# Patient Record
Sex: Female | Born: 1960 | Race: White | Hispanic: No | Marital: Married | Smoking: Never smoker
Health system: Southern US, Community
[De-identification: ages and names within clinical notes are randomized; demographics above are authoritative.]

## PROBLEM LIST (undated history)

## (undated) DIAGNOSIS — F419 Anxiety disorder, unspecified: Secondary | ICD-10-CM

## (undated) DIAGNOSIS — R7303 Prediabetes: Secondary | ICD-10-CM

## (undated) DIAGNOSIS — C569 Malignant neoplasm of unspecified ovary: Secondary | ICD-10-CM

## (undated) DIAGNOSIS — K219 Gastro-esophageal reflux disease without esophagitis: Secondary | ICD-10-CM

## (undated) DIAGNOSIS — M1990 Unspecified osteoarthritis, unspecified site: Secondary | ICD-10-CM

## (undated) DIAGNOSIS — F32A Depression, unspecified: Secondary | ICD-10-CM

## (undated) DIAGNOSIS — I10 Essential (primary) hypertension: Secondary | ICD-10-CM

## (undated) HISTORY — DX: Essential (primary) hypertension: I10

## (undated) HISTORY — DX: Malignant neoplasm of unspecified ovary: C56.9

## (undated) HISTORY — PX: ABDOMINAL HYSTERECTOMY: SHX81

## (undated) HISTORY — DX: Gastro-esophageal reflux disease without esophagitis: K21.9

## (undated) HISTORY — DX: Unspecified osteoarthritis, unspecified site: M19.90

## (undated) HISTORY — PX: APPENDECTOMY: SHX54

## (undated) HISTORY — PX: WISDOM TOOTH EXTRACTION: SHX21

---

## 1989-07-02 HISTORY — PX: TUBAL LIGATION: SHX77

## 2000-07-02 HISTORY — PX: ENDOMETRIAL ABLATION: SHX621

## 2014-08-02 DIAGNOSIS — C569 Malignant neoplasm of unspecified ovary: Secondary | ICD-10-CM

## 2014-08-02 DIAGNOSIS — Z8543 Personal history of malignant neoplasm of ovary: Secondary | ICD-10-CM | POA: Insufficient documentation

## 2014-08-02 HISTORY — DX: Malignant neoplasm of unspecified ovary: C56.9

## 2014-08-05 ENCOUNTER — Encounter: Payer: Self-pay | Admitting: Gynecologic Oncology

## 2014-08-05 ENCOUNTER — Ambulatory Visit: Payer: Self-pay | Attending: Gynecologic Oncology | Admitting: Gynecologic Oncology

## 2014-08-05 VITALS — BP 143/93 | HR 109 | Temp 98.2°F | Resp 16 | Ht 62.0 in | Wt 184.4 lb

## 2014-08-05 DIAGNOSIS — Z79899 Other long term (current) drug therapy: Secondary | ICD-10-CM | POA: Insufficient documentation

## 2014-08-05 DIAGNOSIS — R1900 Intra-abdominal and pelvic swelling, mass and lump, unspecified site: Secondary | ICD-10-CM

## 2014-08-05 DIAGNOSIS — F419 Anxiety disorder, unspecified: Secondary | ICD-10-CM | POA: Insufficient documentation

## 2014-08-05 DIAGNOSIS — N838 Other noninflammatory disorders of ovary, fallopian tube and broad ligament: Secondary | ICD-10-CM

## 2014-08-05 DIAGNOSIS — R971 Elevated cancer antigen 125 [CA 125]: Secondary | ICD-10-CM

## 2014-08-05 NOTE — Patient Instructions (Signed)
Plan for surgery at [facility] with Dr. Colonel Bald on Feb 10, Wednesday.  Your pre-operative appointment at [facility] will be Feb 5, tomorrow, at 1:00pm.

## 2014-08-05 NOTE — Progress Notes (Signed)
Consult Note: Gyn-Onc  Consult was requested by Dr. Marin Roberts for the evaluation of Alyssa [name] 54 y.o. female with an ovarian mass and elevated CA 125  CC:  Chief Complaint  Patient presents with  . pelvic mass    Assessment/Plan:  Alyssa [name]  is a 54 y.o.  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 Her CA-125 is elevated to 68.  I performed a history, physical examination, and personally reviewed the patient's imaging films including the CT of the abdomen and pelvis from 07/28/14. I have a low suspicion for ovarian cancer given the features of the mass, its large size, the absence of ascites, and the absence of metastatic disease on imaging. 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 I discussed with the patient that imaging and CA-125 are nonspecific for ovarian cancer and the only definitive means to rule out malignancy is complete resection of the mass and pathology. Additionally she should be prepared for a surgical staging procedure same operation if malignancy is identified.  We have scheduled the patient for an exploratory laparotomy total abdominal hysterectomy and BSO, frozen section, and surgical staging if indicated. The patient expressed desire to undergo hysterectomy regardless of the findings of malignancy or not in the ovary. I counseled the patient regarding risks of surgery including  bleeding, infection, damage to internal organs (such as bladder,ureters, bowels), blood clot, reoperation and rehospitalization.  In order to facilitate a more timely surgery at the patient's request we have pursued options for surgery in [place] at [facility]. There is availability within a week with my partner Dr. Suzan Slick.   HPI: Alyssa [name] is a 54 year old G3 P3 who is seen in consultation at the request of Dr. Marin Roberts for a 26 cm pelvic mass and elevated  CA-125. The patient began noting vague abdominal fullness and distention since December 2015. When she was evaluated by her local ER at [facility] a CT of the abdomen and pelvis was obtained on 07/28/2014. This revealed a large (26 a 12 cm) cystic mass with enhancing septations and heterogeneous cystic spaces and peripheral nodular soft tissue component. It appeared to be arising from the left ovary. It occupied the entire mid pelvis. There was deviation of the otherwise normal appearing uterus. There was no ascites lymphadenopathy perineal carcinomatosis or omental cake identified. She was seen by Dr. Armandina Stammer this further evaluated her with a CA-125 which was mildly elevated at 68. CEA was normal. Given the potential for underlying malignancy she was sent for my evaluation and consultation.  The patient is otherwise healthy woman. She denies abdominal pain, early satiety, or weight loss. Her only prior abdominal surgery is a tubal ligation. She has also undergone endometrial ablation. The family history is not concerning for increased risk for malignancy (her mother had dual cancers of vulva and lung but was a heavy smoker). She is neck for performance status. She is premenopausal and continues to have regular menses with no intermenstrual bleeding.  She has a history of an ASCUS pap 3 years ago with negative hr HPV testing at that time.  Interval History: she continues to feel abdominal distension.  Current Meds:  Outpatient Encounter Prescriptions as of 08/05/2014  Medication Sig  . ALPRAZolam (XANAX) 0.25 MG tablet Take 0.25 mg by mouth every 8 (eight) hours as needed for anxiety.  Marland Kitchen omeprazole (PRILOSEC) 20 MG capsule  Take 20 mg by mouth daily.  . ondansetron (ZOFRAN) 4 MG tablet Take 4 mg by mouth every 8 (eight) hours as needed for nausea or vomiting.  Marland Kitchen oxyCODONE (OXY IR/ROXICODONE) 5 MG immediate release tablet Take 5 mg by mouth every 6 (six) hours as needed.    Allergy:   Allergies  Allergen Reactions  . Codeine Itching  . Doxycycline     Per pt, she gets chest pain from doxycycline  . Penicillins Nausea And Vomiting    Social Hx:   History   Social History  . Marital Status: Married    Spouse Name: N/A    Number of Children: N/A  . Years of Education: N/A   Occupational History  . Not on file.   Social History Main Topics  . Smoking status: Never Smoker   . Smokeless tobacco: Not on file  . Alcohol Use: No  . Drug Use: No  . Sexual Activity: Yes   Other Topics Concern  . Not on file   Social History Narrative  . No narrative on file    Past Surgical Hx:  Past Surgical History  Procedure Laterality Date  . Tubal ligation  1991  . Endometrial ablation  2002    Past Medical Hx: History reviewed. No pertinent past medical history.  Past Gynecological History:  G3P3 (SVD's), tubal ligation. LMP in December 2015.  No LMP recorded.  Family Hx:  Family History  Problem Relation Age of Onset  . Cancer Mother   . Stroke Father     Review of Systems:  Constitutional  Feels well,    ENT Normal appearing ears and nares bilaterally Skin/Breast  No rash, sores, jaundice, itching, dryness Cardiovascular  No chest pain, shortness of breath, or edema  Pulmonary  No cough or wheeze.  Gastro Intestinal  No nausea, vomitting, or diarrhoea. No bright red blood per rectum, no abdominal pain, change in bowel movement, or constipation.  Genito Urinary  No frequency, urgency, dysuria, see HPI Musculo Skeletal  No myalgia, arthralgia, joint swelling or pain  Neurologic  No weakness, numbness, change in gait,  Psychology  No depression, anxiety, insomnia.   Vitals:  Blood pressure 143/93, pulse 109, temperature 98.2 F (36.8 C), temperature source Oral, resp. rate 16, height 5\' 2"  (1.575 m), weight 184 lb 6.4 oz (83.643 kg).  Physical Exam: WD in NAD Neck  Supple NROM, without any enlargements.  Lymph Node Survey No cervical  supraclavicular or inguinal adenopathy Cardiovascular  Pulse normal rate, regularity and rhythm. S1 and S2 normal.  Lungs  Clear to auscultation bilateraly, without wheezes/crackles/rhonchi. Good air movement.  Skin  No rash/lesions/breakdown  Psychiatry  Alert and oriented to person, place, and time  Abdomen  Normoactive bowel sounds, abdomen soft, non-tender and mildly overweight without evidence of hernia. Palpable distension from the underlying abdomino-pelvic mass (smooth, mobile). Back No CVA tenderness Genito Urinary  Vulva/vagina: Normal external female genitalia.  No lesions. No discharge or bleeding.  Bladder/urethra:  No lesions or masses, well supported bladder  Vagina: regular with no lesions  Cervix: Slit like (consistent with history of obstetric trauma) appearing, no lesions.  Uterus: Small, mobile, no parametrial involvement or nodularity.  Adnexa: large smooth cystic mass originating in pelvis (posterior to uterus) extending to abdomen.   Rectal  Good tone, no masses no cul de sac nodularity.  Extremities  No bilateral cyanosis, clubbing or edema.   Donaciano Eva, MD   08/05/2014, 3:34 PM

## 2014-08-09 ENCOUNTER — Other Ambulatory Visit: Payer: Self-pay | Admitting: Gynecologic Oncology

## 2014-08-09 DIAGNOSIS — F419 Anxiety disorder, unspecified: Secondary | ICD-10-CM

## 2014-08-09 DIAGNOSIS — N838 Other noninflammatory disorders of ovary, fallopian tube and broad ligament: Secondary | ICD-10-CM

## 2014-08-09 MED ORDER — ALPRAZOLAM 0.25 MG PO TABS
0.2500 mg | ORAL_TABLET | Freq: Three times a day (TID) | ORAL | Status: DC | PRN
Start: 2014-08-09 — End: 2014-09-06

## 2014-08-09 NOTE — Progress Notes (Signed)
Patient called requesting refill on xanax to last her until her upcoming surgery at [facility] this week. Requesting 5 tablets.  Situation discussed with Dr. Denman George.  Refill for 5 tablets written for patient and patient advised that she would not receive any refills on this medication.  Anxiety related to upcoming surgery and the outcome.  Advised to call for any questions or concerns.  She is to come by the cancer center today to pick up the prescription.

## 2014-08-19 ENCOUNTER — Telehealth: Payer: Self-pay | Admitting: Oncology

## 2014-08-19 ENCOUNTER — Other Ambulatory Visit: Payer: Self-pay | Admitting: Gynecologic Oncology

## 2014-08-19 DIAGNOSIS — D271 Benign neoplasm of left ovary: Secondary | ICD-10-CM

## 2014-08-19 NOTE — Telephone Encounter (Signed)
S/P IN REF TO NP APPT. ON 09/02/14@3 :00 CHEMO EDU 08/26/14@10 :00 REFERRING DR ROSSI DX-MUCINOUS ADENOFIBROMA OF LEFT OVARY

## 2014-08-20 ENCOUNTER — Telehealth: Payer: Self-pay | Admitting: Oncology

## 2014-08-20 NOTE — Telephone Encounter (Signed)
Pt is aware of np appt.chemo ed-08/23/14@10 :00 appt with Dr. Marko Plume 08/24/14@8 :00

## 2014-08-23 ENCOUNTER — Other Ambulatory Visit: Payer: Self-pay

## 2014-08-23 ENCOUNTER — Encounter: Payer: Self-pay | Admitting: Oncology

## 2014-08-23 ENCOUNTER — Other Ambulatory Visit: Payer: Self-pay | Admitting: Oncology

## 2014-08-23 DIAGNOSIS — C562 Malignant neoplasm of left ovary: Secondary | ICD-10-CM

## 2014-08-23 NOTE — Progress Notes (Signed)
No date for treatment as of today. °

## 2014-08-24 ENCOUNTER — Telehealth: Payer: Self-pay | Admitting: Oncology

## 2014-08-24 ENCOUNTER — Encounter: Payer: Self-pay | Admitting: Oncology

## 2014-08-24 ENCOUNTER — Other Ambulatory Visit (HOSPITAL_BASED_OUTPATIENT_CLINIC_OR_DEPARTMENT_OTHER): Payer: Self-pay

## 2014-08-24 ENCOUNTER — Ambulatory Visit: Payer: Self-pay

## 2014-08-24 ENCOUNTER — Telehealth: Payer: Self-pay | Admitting: *Deleted

## 2014-08-24 ENCOUNTER — Ambulatory Visit (HOSPITAL_BASED_OUTPATIENT_CLINIC_OR_DEPARTMENT_OTHER): Payer: Self-pay | Admitting: Oncology

## 2014-08-24 VITALS — BP 149/97 | HR 87 | Temp 99.1°F | Resp 18 | Ht 62.0 in | Wt 178.3 lb

## 2014-08-24 DIAGNOSIS — N838 Other noninflammatory disorders of ovary, fallopian tube and broad ligament: Secondary | ICD-10-CM

## 2014-08-24 DIAGNOSIS — C562 Malignant neoplasm of left ovary: Secondary | ICD-10-CM | POA: Insufficient documentation

## 2014-08-24 DIAGNOSIS — E8940 Asymptomatic postprocedural ovarian failure: Secondary | ICD-10-CM | POA: Insufficient documentation

## 2014-08-24 LAB — COMPREHENSIVE METABOLIC PANEL (CC13)
ALT: 31 U/L (ref 0–55)
AST: 33 U/L (ref 5–34)
Albumin: 3.3 g/dL — ABNORMAL LOW (ref 3.5–5.0)
Alkaline Phosphatase: 97 U/L (ref 40–150)
Anion Gap: 9 meq/L (ref 3–11)
BUN: 10.2 mg/dL (ref 7.0–26.0)
CO2: 27 meq/L (ref 22–29)
Calcium: 9.1 mg/dL (ref 8.4–10.4)
Chloride: 104 meq/L (ref 98–109)
Creatinine: 0.9 mg/dL (ref 0.6–1.1)
EGFR: 76 ml/min/1.73 m2 — ABNORMAL LOW
Glucose: 123 mg/dL (ref 70–140)
Potassium: 4 meq/L (ref 3.5–5.1)
Sodium: 140 meq/L (ref 136–145)
Total Bilirubin: 0.23 mg/dL (ref 0.20–1.20)
Total Protein: 6.7 g/dL (ref 6.4–8.3)

## 2014-08-24 LAB — CBC WITH DIFFERENTIAL/PLATELET
BASO%: 1.2 % (ref 0.0–2.0)
Basophils Absolute: 0.1 10*3/uL (ref 0.0–0.1)
EOS%: 5.7 % (ref 0.0–7.0)
Eosinophils Absolute: 0.3 10*3/uL (ref 0.0–0.5)
HCT: 37.9 % (ref 34.8–46.6)
HGB: 12.4 g/dL (ref 11.6–15.9)
LYMPH#: 0.9 10*3/uL (ref 0.9–3.3)
LYMPH%: 17.2 % (ref 14.0–49.7)
MCH: 31.1 pg (ref 25.1–34.0)
MCHC: 32.7 g/dL (ref 31.5–36.0)
MCV: 95 fL (ref 79.5–101.0)
MONO#: 0.3 10*3/uL (ref 0.1–0.9)
MONO%: 5.5 % (ref 0.0–14.0)
NEUT#: 3.6 10*3/uL (ref 1.5–6.5)
NEUT%: 70.4 % (ref 38.4–76.8)
Platelets: 375 10*3/uL (ref 145–400)
RBC: 3.99 10*6/uL (ref 3.70–5.45)
RDW: 13 % (ref 11.2–14.5)
WBC: 5.1 10*3/uL (ref 3.9–10.3)

## 2014-08-24 MED ORDER — LORAZEPAM 0.5 MG PO TABS: ORAL_TABLET | ORAL | Status: DC

## 2014-08-24 MED ORDER — DEXAMETHASONE 4 MG PO TABS
ORAL_TABLET | ORAL | Status: DC
Start: 2014-08-24 — End: 2014-09-16

## 2014-08-24 NOTE — Telephone Encounter (Signed)
, °

## 2014-08-24 NOTE — Progress Notes (Signed)
Checked in new pt that is currently having problems with her insurance Nurse, mental health).  She has not received a card or been assigned an ID number.  Her HR department is working on the problem.  I informed her that she will be self-pay and will receive a 50% discount on today's visit.  I advised once her issue with her insurance has been resolved to contact us to get her insurance added and to submit today's charges to them for reimbursement.  She verbalized understanding.

## 2014-08-24 NOTE — Patient Instructions (Signed)
We will send prescriptions to your pharmacy   1.decadron (dexamethasone, steroid) 4 mg. Take five tablets +(=20 mg) with food 12 hrs before taxol chemotherapy and five tablets with food 6 hrs before taxol   2.zofran (ondansetron) 8mg  One tablet every 8 hrs as needed for nausea. Will not make you drowsy. Fine to take one tablet AM after chemo whether or not any nausea then, to extend coverage for nausea a bit longer. Other than that dose, fine to take just as needed for nausea. You have 4 mg zofran tablets now, so can take 2 at time for 8 mg.  3.ativan (lorazepam) 0.5 mg. One tablet swallow or dissolve under tongue every 6 hrs as needed for nausea. WIll make you drowsy and a little forgetful around each dose. Fine to take one tablet at bedtime night of chemo whether or not any nausea.   Over the counter Vitamin E 500 mg at bedtime may help hot flashes, can also take 500 mg twice daily  For taxol aches:  Tylenol, aleve or ibuprofen with food, Percocet all ok to try. Can also take Claritin 10 mg beginning day after chemo to see if this helps alleviate aches. Heating pad, hot shower, ice packs can be worth trying.  You can call any time if needed [phone]

## 2014-08-24 NOTE — Progress Notes (Signed)
[place] NEW PATIENT EVALUATION   Name: Alyssa [name] Date: August 24, 2014  MRN: [id number] DOB: 1960-12-20  REFERRING PHYSICIAN: Terrence Dupont [name]/ Alyssa [name]  CC: Alyssa [name]  [facility] information including operative note, surgical pathology, multidisciplinary clinic note and labs all reviewed in detail by MD for this consultation.  REASON FOR REFERRAL:  IC3 mucinous adenocarcinoma of left ovary   HISTORY OF PRESENT ILLNESS:Alyssa [name] is a 54 y.o. female who is seen in consultation, alone for visit, at the request of Drs Alyssa [name] and Alyssa [name], for consideration of adjuvant chemotherapy for mucinous adenocarcinoma of left ovary. The ovarian mass had already ruptured at time of surgery, this done at [facility] on 08-11-14. Post operative course has been unremarkable, and recommendations given by multidisciplinary conference at [facility].  Patient attended chemotherapy teaching class prior to this visit.  Patient had been in usual good health with entirely regular menstrual periods until indigestion and difficulty eating ~ late Nov 2015, then abdominal fullness and distension by late Dec. The abdominal swelling progressed such that she was seen by PCP at [facility], then ED at [facility] on 07-28-14. CT was obtained at [facility], reportedly with 26 x 12 cm cystic mass with enhancing septations and peripheral nodular soft tissue component, apparently arising from left ovary and occupying entire mid pelvis with deviation of otherwise normal appearing uterus; no ascites, omental cake, peritoneal carcinomatosis or adenopathy per CT. She was seen by Dr Alyssa [name], with CA 125 68 and CEA normal. She was seen in consultation by Dr Alyssa [name] on 08-05-14 and, due to surgical availability, sent to Dr Alyssa [name] at [facility]. Surgery on 08-11-14 was exploratory laparotomy with TAH, BSO and lymphadenectomy, R0 at completion although the ovary had ruptured preoperatively. Intraoperatively  the appendix, bowel, nodes and upper abdomen were unremarkable. She was DC on post op day 2 on lovenox, which she continues. [facility] surgical pathology [phone] 1) from 08-11-14 found well differentiated mucinous adenocarcinoma of left ovary intestinal type with expansile pattern and associated mucinous borderline tumor also in the ovary. The primary tumor was 25 cm, no LVSI, 0/16 nodes involved appendix benign and pelvic washings positive (pT1c pN0; IC3). Case was presented at [facility] multidisciplinary conference, with consideration of FOLFOX vs carbo taxol; final recommendation was for [place] taxol.  Patient had staples removed at [facility] 08-19-14; she is to see Dr Alyssa [name] or Dr Alyssa [name] ~ 09-17-14. She attended chemotherapy education class prior to visit today.  Patient has been recovering well from surgery, tho activity is not back to baseline and she is on lifting restrictions. Abdomen feels much more comfortable than prior to surgery. She has had frequent hot flashes since surgery, none prior "every 30 min" last pm. Daughter is giving lovenox injections, uncomfortable at upper thigh and RN has reviewed administration with patient today. No bleeding, no LE swelling. No SOB or chest pain. Constipation since surgery, last bowel movement 2 days ago, small amount (see below)   REVIEW OF SYSTEMS as above, also: HA yesterday, not typical, resolved. Good visual acuity with glasses. 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 No dental discomfort tho last denatl exam/ cleaning ~ 4 years ago. No thyroid. Past asthma improved when took carpet out of home. No bladder symptoms. Appetite ok, usual weight in past had been 170-175, had been 186 preop. No arthritis. Good peripheral veins. Cyst left breast x several years, tender at times during menstrual cycle, cyst per previous evaluation. Unusually emotional at times, which she  feels is related to menopausal symptoms. Remainder of full 10 point review of systems  negative.   ALLERGIES: Codeine; Doxycycline; and Penicillins  PAST MEDICAL/ SURGICAL HISTORY:    G3P3 BTL Endometrial ablation for bleeding ~ 2002 ASCUS PAP 2013 with negative HPV then Mammogram 2 years ago at [facility] No flu vaccine  CURRENT MEDICATIONS: reviewed as listed now in EMR. Begin bid colace and add miralax daily prn. Decadron, zofran, ativan prescriptions. Suggested Vit E for hot flashes  PHARMACY: [place] on [address] in [place]:  Originally from [place], lives with husband, daughter and daughter's 59 yo in [place]. 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 Never smoker, no ETOH.  FAMILY HISTORY:  Mother with lung and vulvar cancer, was smoker Father with CVA and ETOH abuse 1 brother and 1 sister committed suicide 1 sister living and healthy 2 sons, 1 daughter ages 38- 74 healthy #7 grandchild due in May No other cancer in family          PHYSICAL EXAM:  height is _0  (1.575 m) and weight is 178 lb 4.8 oz (80.876 kg). Her oral temperature is 99.1 F (37.3 C). Her blood pressure is 149/97 and her pulse is 87. Her respiration is 18.  Alert, pleasant, cooperative lady looks stated age, good historian, appears comfortable. Easily mobile in exam room  HEENT:normal hair pattern. PERRL, not icteric. Oral mucosa moist and clear, no obvious dental concerns. Deaf left ear. Neck supple without JVD or thyroid mass.  RESPIRATORY: lungs clear to auscultation and percussion. Respirations not labored RA  CARDIAC/ VASCULAR: Heart RRR no murmur or gallop. Peripheral pulses symmetrical and intact. UE veins appear very adequate for chemotherapy  ABDOMEN: Midline incision closed, no erythema, no tenderness. Bowel sounds present, somewhat diminished. Not obviously distended. No appreciable HSM or mass, no tenderness to palpation. LYMPH NODES: No cervical, supraclavicular, axillary or inguinal  adenopathy BREASTS: Smoothly rounded, mobile 2 cm area left breast at 8:00 otherwise no dominant mass, no skin or nipple findings. NEUROLOGIC: Other than left deafness, CN, motor, sensory, cerebellar nonfocal. PSYCH appropriate mood and affect SKIN: Without rash, ecchymosis, petechiae MUSCULOSKELETAL: Back nontender. Good and symmetrical muscle mass. LE without edema, cords, tenderness.   LABORATORY DATA:  Results for orders placed or performed in visit on 08/24/14 (from the past 48 hour(s))  CBC with Differential     Status: None   Collection Time: 08/24/14  8:27 AM  Result Value Ref Range   WBC 5.1 3.9 - 10.3 10e3/uL   NEUT# 3.6 1.5 - 6.5 10e3/uL   HGB 12.4 11.6 - 15.9 g/dL   HCT 37.9 34.8 - 46.6 %   Platelets 375 145 - 400 10e3/uL   MCV 95.0 79.5 - 101.0 fL   MCH 31.1 25.1 - 34.0 pg   MCHC 32.7 31.5 - 36.0 g/dL   RBC 3.99 3.70 - 5.45 10e6/uL   RDW 13.0 11.2 - 14.5 %   lymph# 0.9 0.9 - 3.3 10e3/uL   MONO# 0.3 0.1 - 0.9 10e3/uL   Eosinophils Absolute 0.3 0.0 - 0.5 10e3/uL   Basophils Absolute 0.1 0.0 - 0.1 10e3/uL   NEUT% 70.4 38.4 - 76.8 %   LYMPH% 17.2 14.0 - 49.7 %   MONO% 5.5 0.0 - 14.0 %   EOS% 5.7 0.0 - 7.0 %   BASO% 1.2 0.0 - 2.0 %  Comprehensive metabolic panel (Cmet) - [facility]  Status: Abnormal   Collection Time: 08/24/14  8:27 AM  Result Value Ref Range   Sodium 140 136 - 145 mEq/L   Potassium 4.0 3.5 - 5.1 mEq/L   Chloride 104 98 - 109 mEq/L   CO2 27 22 - 29 mEq/L   Glucose 123 70 - 140 mg/dl   BUN 10.2 7.0 - 26.0 mg/dL   Creatinine 0.9 0.6 - 1.1 mg/dL   Total Bilirubin 0.23 0.20 - 1.20 mg/dL   Alkaline Phosphatase 97 40 - 150 U/L   AST 33 5 - 34 U/L   ALT 31 0 - 55 U/L   Total Protein 6.7 6.4 - 8.3 g/dL   Albumin 3.3 (L) 3.5 - 5.0 g/dL   Calcium 9.1 8.4 - 10.4 mg/dL   Anion Gap 9 3 - 11 mEq/L   EGFR 76 (L) >90 ml/min/1.73 m2    Comment: eGFR is calculated using the CKD-EPI Creatinine Equation (2009)      PATHOLOGY: Surgical pathology  exam2/04/2015  [facility]  Specimen  Tissue   Result Narrative  Patient:     MARTITA, BRUMM MRN:         [id number] DOB (Age):   February 12, 1961 (Age: 35) Collected:   08/11/2014 Received:    08/11/2014 Completed:   08/20/2014  Surgical Pathology Report   * Amended * Addenda Present  Accession #:   [id number]  Amendments: Amended:  08/20/2014 by Barrie Lyme Reason: Additional Information Comment: Additional pathologic findings Previous Signout Date:  08/17/2014  Diagnosis: A:  Ovary and tube, left, salpingo-oophorectomy  Histologic type:     Mucinous adenocarcinoma, with expansile growth pattern (see comment)       Histologic grade:     Well-differentiated  Tumor site: (ovary/primary peritoneal)     Ovary  Tumor size: (greatest dimension)          25 cm   Ovarian surface involvement:               Negative  Status of capsule: (intact/ruptured)          ruptured pre-operatively per OP note  Extent of involvement of other tissues/organs (select all that apply):          None  Lymphovascular space invasion:          Not identified  Regional lymph nodes (see other specimens):  (See specimen C, D, and I)           Total number involved:      0           Total number examined:     16       Additional pathologic findings:      Left fallopian tube  small paratubal cyst: no neoplasm identified Pelvic washing ([id number])  malignant cells present   AJCC Pathologic Stage: pT1c3 pN0 pMx  FIGO (2014classification) Stage Grouping:  IC3 (pelvic washings positive for malignant cells) Note:  This pathologic stage assessment is based on information available at the time of this report, and is subject to change pending clinical review and additional information.   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 -        No significant atypia, increased mitotic activity or tumor necrosis  identified  Ovary, right, oophorectomy   -      Benign cystic follicles and few epithelial inclusions, no evidence of metastatic disease  Fallopian tube, right, salpingectomy    -     Paraubal cyst, no neoplasm identified   C:  Lymph nodes, right pelvic, regional resection  -  No evidence of metastatic disease in three lymph nodes (0/3)  D:  Lymph nodes, left pelvic, regional resection  -  No evidence of metastatic disease in eight lymph nodes (0/8)  E:  Soft tissue, pericolic, right, biopsy -  No neoplasm identified   F:  Soft tissue, pericolic, left, biopsy -  No neoplasm identified    G:  Soft tissue, right pelvic, biopsy -  Endosalpingiosis, no neoplasm identified  H:  Soft tissue, left pelvic, biopsy  -  No neoplasm identified   I:  Lymph nodes, periaortic, regional resection  -  No evidence of metastatic disease in five lymph nodes (0/5)  J:  Bladder, biopsy -  Endosalpingiosis, no neoplasm identified   K:  Rectum, biopsy -  Fibrous tissue with hemorrhage and slight mesothelial hyperplasia consistent with fibrous adhesion -  No neoplasm identified  L:  Omentum, omentectomy  -  Focal slight mesothelial hyperplasia, no neoplasm identified  M:  Appendix, appendectomy  -  Benign appendix, no neoplasm identified   Comment: Immunohistochemical stains for cytokeratin 7are diffusely and strongly positive.  The cytokeratin 20 and CDX2 show patchy positivity.   This unilateral,  large (25 cm) tumor with no surface involvement, and no destructive stromal invasion is most consistent with a primary mucinous adenocarcinoma of the ovary,  intestinal type.   In areas, the tumor shows features of a mucinous borderline tumor.  On microscopic examination the tumor shows cells with prominent cytoplasmic mucin arranged in cysts as well as in  papillary tufts.  Cribriform structures are also observed and the tumor has a complex expansile growth pattern involving multiple slides.  An infiltrating growth pattern which would be more suggestive of a metastatic lesion is not identified. A primary ovarian tumor is favored, but a metastatic tumor from the upper gastrointestinal tract or pancreas cannot be completely excluded. Clinical correlation is recommended.   PAX8 positivity is present and also supports a gynecologic origin.  The report was amended to change the stage based on the positive pelvic washings ([id number]).   Intraoperative Consult Diagnosis: Frozen section is requested by Dr. Kenton Kingfisher in [place] [address] at 10:32 a.m. Specimen delivery time is 10:40 a.m.  FSA1:  Ovary, left, representative sections -  Invasive carcinoma with prominent mucinous features -  Dr. Rayburn Go concurs  Drs. Nell Range on 08/11/2014 at 11:00 a.m. (A)    Frozen Section Pathologist: Regis Bill, MD  Clinical History: 54 year old female with a pelvic mass.  Gross Description: Received are fourteen appropriately labeled containers.  Container A is additionally labeled "left ovary and left fallopian tube." It contains an 1105 gram, 25 x 17.5 x 5 cm incised ovarian mass. The external surface is gray/tan, smooth, and glistening. The cyst wall ranges from 0.2 to 1.7 cm in thickness, and is fibrous and edematous in portions. The cut surface reveals multiple complex appearing granular nodules, ranging in size from to 2 to 5 cm in greatest dimension. The cut surfaces reveal tan/yellow and fleshy lobulated parenchyma with multifocal areas of hemorrhage and rare necrosis, mucinous and cystic changes, and multiple aggregates of papillary excrescences.   Attached to the ovarian mass is an 8  cm in length x 0.6 cm unremarkable fimbriated fallopian tube; it is serially sectioned and submitted entirely in blocks A22-A24. Fimbria are bisected  and submitted entirely in block A25.    Block summary:  FSA1     - ovary and tube, left, representative section  A2-A21     - representative sections of mass    Container B is additionally labeled "uterus, cervix, right tube and ovary".  Adnexa:                    The right adnexa is present and attached  Weight:                    340 grams  Shape:                    Distorted with a very long cervix Dimensions:    height:                    13.2 cm    anterior to posterior:          8.7 cm    breadth at fundus:          6.7 cm Serosa:                    Pink/tan, smooth and glistening  Cervix:                        ectocervix:               Tan/white and glistening with a 1.5 cm in diameter os     endocervix:               Tan and glistening    length of endocervical canal:     7.9 cm  Endometrium:    length of endometrial cavity:          Approximately 2.0 cm     width of endometrial cavity at fundus:     Approximately 1.7 cm     thickness of endometrial lining:          0.3 cm          other findings:                    The endometrial cavity is distorted by diffuse adenomyosis posteriorly and an intramural nodule anteriorly. Myometrium:    thickness of wall:     Up to 6.0 cm (anterior)         other findings:          There is diffuse trabeculation of the myometrium posteriorly and a single 5.5 cm in greatest dimension tan, somewhat whorled, somewhat fleshy well-circumscribed nodule. The nodule has areas of hemorrhage within the center. Adnexa:    Right ovary:       measurement:     3.9 x 2.5 x 1.6 cm        external surface:     Gray, smooth and glistening       cut surface:          Multiple cortical cysts, some of which are filled with clear watery fluid, some are lined by tan/red tissue.  There are no solid components seen.      Right fallopian tube:       measurement:     Fimbriate, 5.5 cm in length x 0.5 cm in diameter  other findings:          There are  two clips embedded within the tube, grossly consistent with tubal ligation. The clips are approximately 1.2 cm from the fimbriated end.  The lumen on either side of the clips is patent. Other comments:          None Tissue submitted for special investigations:     No     Container M is additionally labeled "appendix" and holds an intact 5 cm long x 0.5 cm in diameter appendix. The proximal end is opened. The serosa is pink, smooth and glistening with a 0.8 cm linear area of adhesions, 0.2 cm from the proximal margin.  The lumen is filled with brown semisolid fecal material.  The mucosa is tan and glistening and the wall is 0.2 cm thick.  :  Addendum A PAX8 stain was also performed on the mucinous tumor.  There is strong patchy nuclear staining with PAX8.  This staining pattern is also supportive of a primary ovarian origin.      Cytology - Washing2/04/2015  [facility]  Specimen  Washing - Pelvic   Result Narrative  Patient:     ALANYS, GODINO MRN:         [id number] DOB (Age):   Jun 03, 1961 (Age: 41) Collected:   08/11/2014 Received:    08/12/2014 Completed:   08/17/2014   [facility]#[id number]      Non-GYN Cytopathology Report    Accession #:   [id number]  DIAGNOSIS: A. Pelvic washing - Malignant cells present  Specimen Adequacy Satisfactory for evaluation         RADIOGRAPHY: No results found. Will request report of CT done [facility] ~ 07-28-14    DISCUSSION: All of history and course to date reviewed with patient now. We have discussed mucinous adenocarcinomas and consensus that this tumor was primary in ovary based on associated mucinous borderline ovarian tumor, normal appendix and other intraoperative and pathologic findings, with recommendation for adjuvant chemotherapy primarily due to preoperative rupture. She understands that mucinous adenocarcinomas are a rare type of ovarian malignancy and that this chemotherapy is recommended due to its  usefulness in other more typical ovarian malignancies. I have offered her treatment at a different facility if more convenient, however she prefers treatment in [place] in order to coordinate most easily with [facility] gyn oncology. We have discussed outpatient administration of carboplatin and taxol, reviewed possible side effects including taxol aches and taxol neuropathy, discussed premedication steroids and antiemetics. She would like to begin in near future, date to be coordinated with infusion tho she would prefer Tuesdays and understands usual outpatient follow up during chemo.  Verbal consent obtained for chemotherapy.   As flu season has been extremely light and should be nearly over, will not give flu vaccine now. She understands that Tamiflu can be useful in unlikely event that she develops influenza.   IMPRESSION / PLAN:   1.IC3 mucinous adenocarcinoma of left ovary: preoperative rupture, surgery by gyn oncology at [facility] 08-11-14 (R0), recovering well post operatively and to begin adjuvant chemotherapy in next 1-2 weeks.  2.surgically induced menopause: suggested Vitamin E and increasing po fluids now, may need to do other interventions depending on severity of symptoms ongoing. 3.overdue mammograms, finding consistent with known cyst left breast. Will address as soon as possible 4.no colonoscopy 5.BTL and endometrial ablation 6.medication intolerances as notes 7.no flu vaccine 8.no advance directives, information given.   Patient had questions answered to her satisfaction and is in agreement  with plan above. She can contact this office for questions or concerns at any time prior to next scheduled visit. Chemo orders entered; possible neupogen/ granix . Financial staff notified for preauth. Some question re insurance, which apparently does not show from her employer (?).  Time spent 55 min, including >50% discussion and coordination of care.    Fredda Clarida P, MD 08/24/2014 12:15  PM

## 2014-08-24 NOTE — Telephone Encounter (Signed)
Per staff phone call and POF I have schedueld appts. Scheduler advised of appts.  JMW  

## 2014-08-26 ENCOUNTER — Other Ambulatory Visit: Payer: Self-pay

## 2014-08-27 ENCOUNTER — Other Ambulatory Visit: Payer: Self-pay | Admitting: Oncology

## 2014-08-31 ENCOUNTER — Telehealth: Payer: Self-pay | Admitting: Oncology

## 2014-08-31 ENCOUNTER — Other Ambulatory Visit: Payer: Self-pay | Admitting: Oncology

## 2014-08-31 ENCOUNTER — Ambulatory Visit (HOSPITAL_BASED_OUTPATIENT_CLINIC_OR_DEPARTMENT_OTHER): Payer: Self-pay

## 2014-08-31 DIAGNOSIS — C562 Malignant neoplasm of left ovary: Secondary | ICD-10-CM

## 2014-08-31 DIAGNOSIS — Z5111 Encounter for antineoplastic chemotherapy: Secondary | ICD-10-CM

## 2014-08-31 MED ORDER — PACLITAXEL CHEMO INJECTION 300 MG/50ML
175.0000 mg/m2 | Freq: Once | INTRAVENOUS | Status: AC
  Administered 2014-08-31: 330 mg via INTRAVENOUS
  Filled 2014-08-31: qty 55

## 2014-08-31 MED ORDER — ONDANSETRON 16 MG/50ML IVPB (CHCC)
INTRAVENOUS | Status: AC
  Filled 2014-08-31: qty 16

## 2014-08-31 MED ORDER — SODIUM CHLORIDE 0.9 % IV SOLN
Freq: Once | INTRAVENOUS | Status: AC
  Administered 2014-08-31: 08:00:00 via INTRAVENOUS

## 2014-08-31 MED ORDER — ONDANSETRON 16 MG/50ML IVPB (CHCC)
16.0000 mg | Freq: Once | INTRAVENOUS | Status: AC
  Administered 2014-08-31: 16 mg via INTRAVENOUS

## 2014-08-31 MED ORDER — DIPHENHYDRAMINE HCL 50 MG/ML IJ SOLN
INTRAMUSCULAR | Status: AC
  Filled 2014-08-31: qty 1

## 2014-08-31 MED ORDER — DEXAMETHASONE SODIUM PHOSPHATE 20 MG/5ML IJ SOLN
INTRAMUSCULAR | Status: AC
  Filled 2014-08-31: qty 5

## 2014-08-31 MED ORDER — DIPHENHYDRAMINE HCL 50 MG/ML IJ SOLN
50.0000 mg | Freq: Once | INTRAMUSCULAR | Status: AC
  Administered 2014-08-31: 50 mg via INTRAVENOUS

## 2014-08-31 MED ORDER — FAMOTIDINE IN NACL 20-0.9 MG/50ML-% IV SOLN
INTRAVENOUS | Status: AC
  Filled 2014-08-31: qty 50

## 2014-08-31 MED ORDER — SODIUM CHLORIDE 0.9 % IV SOLN
716.4000 mg | Freq: Once | INTRAVENOUS | Status: AC
  Administered 2014-08-31: 720 mg via INTRAVENOUS
  Filled 2014-08-31: qty 72

## 2014-08-31 MED ORDER — FAMOTIDINE IN NACL 20-0.9 MG/50ML-% IV SOLN
20.0000 mg | Freq: Once | INTRAVENOUS | Status: AC
  Administered 2014-08-31: 20 mg via INTRAVENOUS

## 2014-08-31 MED ORDER — DEXAMETHASONE SODIUM PHOSPHATE 20 MG/5ML IJ SOLN
20.0000 mg | Freq: Once | INTRAMUSCULAR | Status: AC
  Administered 2014-08-31: 20 mg via INTRAVENOUS

## 2014-08-31 NOTE — Progress Notes (Signed)
Pt instructed to stop Lovenox after dose on 09/05/14 per Dr. Marko Plume.  Pt notified that Dr. Marko Plume would like to see her on 09/06/14 with labs.

## 2014-08-31 NOTE — Patient Instructions (Addendum)
[place] Discharge Instructions for Patients Receiving Chemotherapy  Today you received the following chemotherapy agents:  Taxol and Carboplatin  To help prevent nausea and vomiting after your treatment, we encourage you to take your nausea medication as ordered per MD.   If you develop nausea and vomiting that is not controlled by your nausea medication, call the clinic.   BELOW ARE SYMPTOMS THAT SHOULD BE REPORTED IMMEDIATELY:  *FEVER GREATER THAN 100.5 F  *CHILLS WITH OR WITHOUT FEVER  NAUSEA AND VOMITING THAT IS NOT CONTROLLED WITH YOUR NAUSEA MEDICATION  *UNUSUAL SHORTNESS OF BREATH  *UNUSUAL BRUISING OR BLEEDING  TENDERNESS IN MOUTH AND THROAT WITH OR WITHOUT PRESENCE OF ULCERS  *URINARY PROBLEMS  *BOWEL PROBLEMS  UNUSUAL RASH Items with * indicate a potential emergency and should be followed up as soon as possible.  Feel free to call the clinic you have any questions or concerns. The clinic phone number is [phone].    Last dose of Lovenox on 09/05/14 per Dr. Marko Plume.  You will see Dr. Marko Plume and have lab work on 09/06/14.

## 2014-08-31 NOTE — Telephone Encounter (Signed)
Lft msg for pt confirming labs/ov per 03/01 POF..... Mailed schedule to pt... KJ

## 2014-09-01 ENCOUNTER — Telehealth: Payer: Self-pay | Admitting: *Deleted

## 2014-09-01 NOTE — Telephone Encounter (Signed)
Alyssa [name] called to ask where to bring disability papers.  Advised her to bring them to [facility] on Monday when she returns.  Spoke with her about her tolerance of her first treatment yesterday.  She said she is doing well with no GI complaints, is voiding well, eating and drinking well.  The only complaint she has is some itching on her palms for which she is taking Benadryl.

## 2014-09-02 ENCOUNTER — Ambulatory Visit: Payer: Self-pay

## 2014-09-02 ENCOUNTER — Ambulatory Visit: Payer: Self-pay | Admitting: Oncology

## 2014-09-02 ENCOUNTER — Other Ambulatory Visit: Payer: Self-pay

## 2014-09-03 ENCOUNTER — Other Ambulatory Visit: Payer: Self-pay

## 2014-09-03 DIAGNOSIS — C562 Malignant neoplasm of left ovary: Secondary | ICD-10-CM

## 2014-09-04 ENCOUNTER — Other Ambulatory Visit: Payer: Self-pay | Admitting: Oncology

## 2014-09-04 DIAGNOSIS — C562 Malignant neoplasm of left ovary: Secondary | ICD-10-CM

## 2014-09-06 ENCOUNTER — Other Ambulatory Visit (HOSPITAL_BASED_OUTPATIENT_CLINIC_OR_DEPARTMENT_OTHER): Payer: Self-pay

## 2014-09-06 ENCOUNTER — Encounter: Payer: Self-pay | Admitting: Oncology

## 2014-09-06 ENCOUNTER — Ambulatory Visit (HOSPITAL_BASED_OUTPATIENT_CLINIC_OR_DEPARTMENT_OTHER): Payer: Self-pay | Admitting: Oncology

## 2014-09-06 ENCOUNTER — Telehealth: Payer: Self-pay | Admitting: Oncology

## 2014-09-06 VITALS — BP 138/85 | HR 110 | Temp 98.5°F | Resp 18 | Ht 62.0 in | Wt 172.7 lb

## 2014-09-06 DIAGNOSIS — E8940 Asymptomatic postprocedural ovarian failure: Secondary | ICD-10-CM

## 2014-09-06 DIAGNOSIS — C562 Malignant neoplasm of left ovary: Secondary | ICD-10-CM

## 2014-09-06 DIAGNOSIS — K219 Gastro-esophageal reflux disease without esophagitis: Secondary | ICD-10-CM

## 2014-09-06 LAB — COMPREHENSIVE METABOLIC PANEL (CC13)
ALT: 62 U/L — ABNORMAL HIGH (ref 0–55)
ANION GAP: 11 meq/L (ref 3–11)
AST: 39 U/L — ABNORMAL HIGH (ref 5–34)
Albumin: 4.1 g/dL (ref 3.5–5.0)
Alkaline Phosphatase: 81 U/L (ref 40–150)
BUN: 16.5 mg/dL (ref 7.0–26.0)
CO2: 26 meq/L (ref 22–29)
CREATININE: 1 mg/dL (ref 0.6–1.1)
Calcium: 9.5 mg/dL (ref 8.4–10.4)
Chloride: 101 mEq/L (ref 98–109)
EGFR: 66 mL/min/{1.73_m2} — AB (ref >90)
Glucose: 119 mg/dl (ref 70–140)
Potassium: 3.9 mEq/L (ref 3.5–5.1)
Sodium: 139 mEq/L (ref 136–145)
Total Bilirubin: 0.47 mg/dL (ref 0.20–1.20)
Total Protein: 7.6 g/dL (ref 6.4–8.3)

## 2014-09-06 LAB — CBC WITH DIFFERENTIAL/PLATELET
BASO%: 1.4 % (ref 0.0–2.0)
Basophils Absolute: 0 10*3/uL (ref 0.0–0.1)
EOS%: 8 % — ABNORMAL HIGH (ref 0.0–7.0)
Eosinophils Absolute: 0.2 10*3/uL (ref 0.0–0.5)
HCT: 44 % (ref 34.8–46.6)
HGB: 14.1 g/dL (ref 11.6–15.9)
LYMPH%: 27.8 % (ref 14.0–49.7)
MCH: 30 pg (ref 25.1–34.0)
MCHC: 32 g/dL (ref 31.5–36.0)
MCV: 93.8 fL (ref 79.5–101.0)
MONO#: 0 10*3/uL — ABNORMAL LOW (ref 0.1–0.9)
MONO%: 1 % (ref 0.0–14.0)
NEUT%: 61.8 % (ref 38.4–76.8)
NEUTROS ABS: 1.8 10*3/uL (ref 1.5–6.5)
Platelets: 261 10*3/uL (ref 145–400)
RBC: 4.69 10*6/uL (ref 3.70–5.45)
RDW: 13.2 % (ref 11.2–14.5)
WBC: 2.9 10*3/uL — AB (ref 3.9–10.3)
lymph#: 0.8 10*3/uL — ABNORMAL LOW (ref 0.9–3.3)

## 2014-09-06 MED ORDER — OMEPRAZOLE 20 MG PO CPDR
20.0000 mg | DELAYED_RELEASE_CAPSULE | Freq: Every day | ORAL | Status: DC
Start: 2014-09-06 — End: 2014-12-09

## 2014-09-06 NOTE — Progress Notes (Signed)
OFFICE PROGRESS NOTE   September 06, 2014   Physicians:Emma Rossi/ [place] Bae-Jump,C.Gaccione  INTERVAL HISTORY:  Patient is seen, alone for visit, in continuing attention to chemotherapy being used in adjuvant fashion for [facility] mucinous adenocarcinoma of ovary, first carboplatin taxol given 08-31-14.  Patient is pleased that she has had no nausea at all with this first chemo. She had significant taxol aches mostly in LE bilaterally beginning day 3, did not use claritin but did get some relief with occasional Percocet and NSAID. Bowels are now moving well daily. She did not sleep night prior to chemo with premed decadron, then was weepy on day 2. She had itching palms and soles, improved with change in soap to [place]. She is more fatigued in last couple of days, but not markedly so. She denies peripheral neuropathy symptoms. Peripheral IV access was adequate.  No PAC No flu vaccine No genetics evaluation, and apparentli also did not have the medical insurance that she had thought from her job. Message to financial staff  Per patient, no insurance coverage for medications, and apparently no insurance otherwise, which I believe she did not realize from situation with her work prior to this illness.  ONCOLOGIC HISTORY Patient had been in usual good health with entirely regular menstrual periods until indigestion and difficulty eating ~ late Nov 2015, then abdominal fullness and distension by late Dec. The abdominal swelling progressed such that she was seen by PCP at [facility], then ED at [facility] on 07-28-14. CT was obtained at [facility], reportedly with 26 x 12 cm cystic mass with enhancing septations and peripheral nodular soft tissue component, apparently arising from left ovary and occupying entire mid pelvis with deviation of otherwise normal appearing uterus; no ascites, omental cake, peritoneal carcinomatosis or adenopathy per CT. She was seen by Dr Armandina Stammer, with CA 125 68 and CEA  normal. She was seen in consultation by Dr Denman George on 08-05-14 and, due to surgical availability, sent to Dr Suzan Slick at [facility]. Surgery on 08-11-14 was exploratory laparotomy with TAH, BSO and lymphadenectomy, R0 at completion although the ovary had ruptured preoperatively. Intraoperatively the appendix, bowel, nodes and upper abdomen were unremarkable. She was DC on post op day 2 on lovenox, which she continues. 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 The primary tumor was 25 cm, no LVSI, 0/16 nodes involved appendix benign and pelvic washings positive (pT1c pN0; IC3). Case was presented at [facility] multidisciplinary conference, with consideration of FOLFOX vs carbo taxol; final recommendation was for [place] taxol. She had first carboplatin taxol on 08-31-14.     Review of systems as above, also: No fever or symptoms of infection. No SOB or cough. No hair loss yet, aware that this will likely happen around time of second treatment. Some burning in epigastrium but no pain, glad to continue prilosec Remainder of 10 point Review of Systems negative.  Objective:  Vital signs in last 24 hours:  BP 138/85 mmHg  Pulse 110  Temp(Src) 98.5 F (36.9 C) (Oral)  Resp 18  Ht _0  (1.575 m)  Wt 172 lb 11.2 oz (78.336 kg)  BMI 31.58 kg/m2  SpO2 100% Weight down 5 lbs Alert, oriented and appropriate. Ambulatory without difficulty. Looks comfortable, respirations not labored, very pleasant. No alopecia  HEENT:PERRL, sclerae not icteric. Oral mucosa moist without lesions, posterior pharynx clear.  Neck supple. No  JVD.  Lymphatics:no cervical,supraclavicular, axillary or inguinal adenopathy Resp: clear to auscultation bilaterally and normal percussion bilaterally Cardio: regular rate and rhythm. No gallop. GI: soft, nontender, not  distended, no mass or organomegaly. Normally active bowel sounds. Surgical incision closed,  not remarkable. Musculoskeletal/ Extremities: without pitting edema, cords, tenderness Neuro: no peripheral neuropathy. Otherwise nonfocal. PSYCH appropriate mood and affect Skin without rash, ecchymosis, petechiae   Lab Results:  Results for orders placed or performed in visit on 09/06/14  CBC with Differential  Result Value Ref Range   WBC 2.9 (L) 3.9 - 10.3 10e3/uL   NEUT# 1.8 1.5 - 6.5 10e3/uL   HGB 14.1 11.6 - 15.9 g/dL   HCT 44.0 34.8 - 46.6 %   Platelets 261 145 - 400 10e3/uL   MCV 93.8 79.5 - 101.0 fL   MCH 30.0 25.1 - 34.0 pg   MCHC 32.0 31.5 - 36.0 g/dL   RBC 4.69 3.70 - 5.45 10e6/uL   RDW 13.2 11.2 - 14.5 %   lymph# 0.8 (L) 0.9 - 3.3 10e3/uL   MONO# 0.0 (L) 0.1 - 0.9 10e3/uL   Eosinophils Absolute 0.2 0.0 - 0.5 10e3/uL   Basophils Absolute 0.0 0.0 - 0.1 10e3/uL   NEUT% 61.8 38.4 - 76.8 %   LYMPH% 27.8 14.0 - 49.7 %   MONO% 1.0 0.0 - 14.0 %   EOS% 8.0 (H) 0.0 - 7.0 %   BASO% 1.4 0.0 - 2.0 %  Comprehensive metabolic panel (Cmet) - [facility]  Result Value Ref Range   Sodium 139 136 - 145 mEq/L   Potassium 3.9 3.5 - 5.1 mEq/L   Chloride 101 98 - 109 mEq/L   CO2 26 22 - 29 mEq/L   Glucose 119 70 - 140 mg/dl   BUN 16.5 7.0 - 26.0 mg/dL   Creatinine 1.0 0.6 - 1.1 mg/dL   Total Bilirubin 0.47 0.20 - 1.20 mg/dL   Alkaline Phosphatase 81 40 - 150 U/L   AST 39 (H) 5 - 34 U/L   ALT 62 (H) 0 - 55 U/L   Total Protein 7.6 6.4 - 8.3 g/dL   Albumin 4.1 3.5 - 5.0 g/dL   Calcium 9.5 8.4 - 10.4 mg/dL   Anion Gap 11 3 - 11 mEq/L   EGFR 66 (L) >90 ml/min/1.73 m2    Discussed counts as above, not yet at nadir.  Studies/Results:  No results found.  Medications: I have reviewed the patient's current medications. Continue Prilosec either prescription or OTC (does not have insurance coverage).With next treatments will begin claritin night of chemo or next day, ahead of taxol aches. OK to use  ativan day after steroids or if unable to sleep night of steroids.  DISCUSSION: reviewed status since first chemo, overall has tolerated well. Medications as above. She will call prior to recheck counts on 09-09-14 if very fatigued or any fever/ symptoms of infection, understands that she may need gCSF added. Discussed hair loss around cycle 2, already got wig.  Assessment/Plan: 1.IC3 mucinous adenocarcinoma of left ovary: preoperative rupture, surgery by gyn oncology at [facility] 08-11-14 (R0), recovering well post operatively and adjuvant carbo taxol begun 08-31-14. Will check counts on 3-10/ possible gCSF then, and I will see her with labs 3-17. She will be due cycle 2 on 09-21-14. 2.surgically induced menopause: may need to do other interventions depending on severity of symptoms ongoing. 3.overdue mammograms, finding consistent with known cyst left breast. Will address as soon as possible. Message to staff re mammogram scholarship. 4.no  colonoscopy 5.BTL and endometrial ablation 6.medication intolerances as notes 7.no flu vaccine 8.no advance directives, information given.   Patient follows discussion well and is in agreement with plans as above. She knows that she can call if needed prior to next scheduled visit.    Stoney Karczewski P, MD   09/06/2014, 3:04 PM

## 2014-09-06 NOTE — Telephone Encounter (Signed)
appts made and avs printed for pt  Alyssa °

## 2014-09-06 NOTE — Patient Instructions (Addendum)
Continue prilosec 20 mg daily - this can be either over the counter or prescription  Call if you are very tired prior to repeating blood counts on 3-10, or if fever or symptoms of infection   [phone]

## 2014-09-07 LAB — CA 125: CA 125: 65 U/mL — ABNORMAL HIGH (ref <35)

## 2014-09-08 ENCOUNTER — Encounter: Payer: Self-pay | Admitting: Oncology

## 2014-09-08 ENCOUNTER — Other Ambulatory Visit: Payer: Self-pay | Admitting: Oncology

## 2014-09-08 NOTE — Progress Notes (Signed)
We will try and get her asst for neupogen-she has no insurance at this time.

## 2014-09-08 NOTE — Progress Notes (Signed)
Called and spoke with patient and she will see Lenise for application for asst and will leave her income verification

## 2014-09-08 NOTE — Progress Notes (Signed)
I faxed a copy of the disab form to [facility] for the patient and will have her copy ready for her.

## 2014-09-09 ENCOUNTER — Ambulatory Visit (HOSPITAL_BASED_OUTPATIENT_CLINIC_OR_DEPARTMENT_OTHER): Payer: Self-pay

## 2014-09-09 ENCOUNTER — Other Ambulatory Visit (HOSPITAL_BASED_OUTPATIENT_CLINIC_OR_DEPARTMENT_OTHER): Payer: Self-pay

## 2014-09-09 ENCOUNTER — Other Ambulatory Visit: Payer: Self-pay | Admitting: Oncology

## 2014-09-09 ENCOUNTER — Telehealth: Payer: Self-pay | Admitting: *Deleted

## 2014-09-09 ENCOUNTER — Encounter: Payer: Self-pay | Admitting: Oncology

## 2014-09-09 ENCOUNTER — Telehealth: Payer: Self-pay | Admitting: Oncology

## 2014-09-09 ENCOUNTER — Other Ambulatory Visit: Payer: Self-pay

## 2014-09-09 ENCOUNTER — Ambulatory Visit: Payer: Self-pay | Admitting: Oncology

## 2014-09-09 DIAGNOSIS — D709 Neutropenia, unspecified: Secondary | ICD-10-CM

## 2014-09-09 DIAGNOSIS — C562 Malignant neoplasm of left ovary: Secondary | ICD-10-CM

## 2014-09-09 DIAGNOSIS — Z5189 Encounter for other specified aftercare: Secondary | ICD-10-CM

## 2014-09-09 LAB — CBC WITH DIFFERENTIAL/PLATELET
BASO%: 4 % — AB (ref 0.0–2.0)
Basophils Absolute: 0.1 10*3/uL (ref 0.0–0.1)
EOS ABS: 0.1 10*3/uL (ref 0.0–0.5)
EOS%: 9.5 % — ABNORMAL HIGH (ref 0.0–7.0)
HCT: 38.2 % (ref 34.8–46.6)
HGB: 12.6 g/dL (ref 11.6–15.9)
LYMPH#: 0.9 10*3/uL (ref 0.9–3.3)
LYMPH%: 67.5 % — ABNORMAL HIGH (ref 14.0–49.7)
MCH: 30.7 pg (ref 25.1–34.0)
MCHC: 33 g/dL (ref 31.5–36.0)
MCV: 93.2 fL (ref 79.5–101.0)
MONO#: 0.1 10*3/uL (ref 0.1–0.9)
MONO%: 9.5 % (ref 0.0–14.0)
NEUT#: 0.1 10*3/uL — CL (ref 1.5–6.5)
NEUT%: 9.5 % — ABNORMAL LOW (ref 38.4–76.8)
Platelets: 205 10*3/uL (ref 145–400)
RBC: 4.1 10*6/uL (ref 3.70–5.45)
RDW: 12.4 % (ref 11.2–14.5)
WBC: 1.3 10*3/uL — ABNORMAL LOW (ref 3.9–10.3)

## 2014-09-09 MED ORDER — TBO-FILGRASTIM 300 MCG/0.5ML ~~LOC~~ SOSY
300.0000 ug | PREFILLED_SYRINGE | Freq: Once | SUBCUTANEOUS | Status: AC
  Administered 2014-09-09: 300 ug via SUBCUTANEOUS
  Filled 2014-09-09: qty 0.5

## 2014-09-09 MED ORDER — CIPROFLOXACIN HCL 250 MG PO TABS: ORAL_TABLET | ORAL | Status: DC

## 2014-09-09 MED ORDER — FILGRASTIM 300 MCG/0.5ML IJ SOSY
300.0000 ug | PREFILLED_SYRINGE | Freq: Once | INTRAMUSCULAR | Status: DC
  Filled 2014-09-09: qty 0.5

## 2014-09-09 NOTE — Telephone Encounter (Signed)
While on the phone with patient regarding neutropenia I told her that Dr. Marko Plume currently has 4 cycles of chemo ordered now but that she can discuss this with MD at office visit on 09/16/14. Patient agreeable to this.

## 2014-09-09 NOTE — Telephone Encounter (Addendum)
Per Dr. Marko Plume, patient started on Cipro 250mg  BID until [place] is greater than or equal to 1.0. Prescription sent to patient's pharmacy and patient notified of this. Patient agreeable to pickup antibiotic today and to start taking it this afternoon. Reviewed appts for tomorrow and Saturday with patient as well while on the phone. Told patient that depending on her lab results from tomorrow she possibly will need to come in Monday as well for labs. Told patient RN will call her tomorrow if labs are needed on Monday.  Reviewed neutropenic precautions while on the phone. Patient states she did get masks this morning while here for the injection and she is agreeable to wear one whenever she is out in public.

## 2014-09-09 NOTE — Telephone Encounter (Signed)
THE PAPERWORK PT. RECEIVED TODAY STATED SHE HAS HAD THE SECOND CYCLE OF FOUR CYCLES. PT. THOUGHT SHE WAS TO RECEIVE SIX CYCLES. THIS NOTE WAS SENT TO DR.LIVESAY'S NURSE, Harvie Bridge.

## 2014-09-09 NOTE — Progress Notes (Signed)
Patient signed form from [employer] for possible asst. I advised her of self pay until she gets insurance so make sure she contacts billing on [employer] and/or asst. I also gave her ph# for [employer] to check into insurance.

## 2014-09-09 NOTE — Progress Notes (Signed)
I faxed app to [employer] for poss asst with neupogen. [phone]

## 2014-09-09 NOTE — Patient Instructions (Signed)
Neutropenia Neutropenia is a condition that occurs when the level of a certain type of white blood cell (neutrophil) in your body becomes lower than normal. Neutrophils are made in the bone marrow and fight infections. These cells protect against bacteria and viruses. The fewer neutrophils you have, and the longer your body remains without them, the greater your risk of getting a severe infection becomes. CAUSES  The cause of neutropenia may be hard to determine. However, it is usually due to 3 main problems:   Decreased production of neutrophils. This may be due to:  Certain medicines such as chemotherapy.  Genetic problems.  Cancer.  Radiation treatments.  Vitamin deficiency.  Some pesticides.  Increased destruction of neutrophils. This may be due to:  Overwhelming infections.  Hemolytic anemia. This is when the body destroys its own blood cells.  Chemotherapy.  Neutrophils moving to areas of the body where they cannot fight infections. This may be due to:  Dialysis procedures.  Conditions where the spleen becomes enlarged. Neutrophils are held in the spleen and are not available to the rest of the body.  Overwhelming infections. The neutrophils are held in the area of the infection and are not available to the rest of the body. SYMPTOMS  There are no specific symptoms of neutropenia. The lack of neutrophils can result in an infection, and an infection can cause various problems. DIAGNOSIS  Diagnosis is made by a blood test. A complete blood count is performed. The normal level of neutrophils in human blood differs with age and race. Infants have lower counts than older children and adults. African Americans have lower counts than Caucasians or Asians. The average adult level is 1500 cells/mm3 of blood. Neutrophil counts are interpreted as follows:  Greater than 1000 cells/mm3 gives normal protection against infection.  500 to 1000 cells/mm3 gives an increased risk for  infection.  200 to 500 cells/mm3 is a greater risk for severe infection.  Lower than 200 cells/mm3 is a marked risk of infection. This may require hospitalization and treatment with antibiotic medicines. TREATMENT  Treatment depends on the underlying cause, severity, and presence of infections or symptoms. It also depends on your health. Your caregiver will discuss the treatment plan with you. Mild cases are often easily treated and have a good outcome. Preventative measures may also be started to limit your risk of infections. Treatment can include:  Taking antibiotics.  Stopping medicines that are known to cause neutropenia.  Correcting nutritional deficiencies by eating green vegetables to supply folic acid and taking vitamin B supplements.  Stopping exposure to pesticides if your neutropenia is related to pesticide exposure.  Taking a blood growth factor called sargramostim, pegfilgrastim, or filgrastim if you are undergoing chemotherapy for cancer. This stimulates white blood cell production.  Removal of the spleen if you have Felty's syndrome and have repeated infections. HOME CARE INSTRUCTIONS   Follow your caregiver's instructions about when you need to have blood work done.  Wash your hands often. Make sure others who come in contact with you also wash their hands.  Wash raw fruits and vegetables before eating them. They can carry bacteria and fungi.  Avoid people with colds or spreadable (contagious) diseases (chickenpox, herpes zoster, influenza).  Avoid large crowds.  Avoid construction areas. The dust can release fungus into the air.  Be cautious around children in daycare or school environments.  Take care of your respiratory system by coughing and deep breathing.  Bathe daily.  Protect your skin from cuts and   burns.  Do not work in the garden or with flowers and plants.  Care for the mouth before and after meals by brushing with a soft toothbrush. If you have  mucositis, do not use mouthwash. Mouthwash contains alcohol and can dry out the mouth even more.  Clean the area between the genitals and the anus (perineal area) after urination and bowel movements. Women need to wipe from front to back.  Use a water soluble lubricant during sexual intercourse and practice good hygiene after. Do not have intercourse if you are severely neutropenic. Check with your caregiver for guidelines.  Exercise daily as tolerated.  Avoid people who were vaccinated with a live vaccine in the past 30 days. You should not receive live vaccines (polio, typhoid).  Do not provide direct care for pets. Avoid animal droppings. Do not clean litter boxes and bird cages.  Do not share food utensils.  Do not use tampons, enemas, or rectal suppositories unless directed by your caregiver.  Use an electric razor to remove hair.  Wash your hands after handling magazines, letters, and newspapers. SEEK IMMEDIATE MEDICAL CARE IF:   You have a fever.  You have chills or start to shake.  You feel nauseous or vomit.  You develop mouth sores.  You develop aches and pains.  You have redness and swelling around open wounds.  Your skin is warm to the touch.  You have pus coming from your wounds.  You develop swollen lymph nodes.  You feel weak or fatigued.  You develop red streaks on the skin. MAKE SURE YOU:  Understand these instructions.  Will watch your condition.  Will get help right away if you are not doing well or get worse. Document Released: 12/08/2001 Document Revised: 09/10/2011 Document Reviewed: 01/05/2011 ExitCare Patient Information 2015 [employer]. This information is not intended to replace advice given to you by your health care provider. Make sure you discuss any questions you have with your health care provider.  

## 2014-09-09 NOTE — Telephone Encounter (Signed)
appts made and pt called with appts for 3/11

## 2014-09-10 ENCOUNTER — Other Ambulatory Visit: Payer: Self-pay | Admitting: Oncology

## 2014-09-10 ENCOUNTER — Ambulatory Visit (HOSPITAL_BASED_OUTPATIENT_CLINIC_OR_DEPARTMENT_OTHER): Payer: Self-pay

## 2014-09-10 ENCOUNTER — Other Ambulatory Visit (HOSPITAL_BASED_OUTPATIENT_CLINIC_OR_DEPARTMENT_OTHER): Payer: Self-pay

## 2014-09-10 ENCOUNTER — Encounter: Payer: Self-pay | Admitting: Oncology

## 2014-09-10 ENCOUNTER — Other Ambulatory Visit: Payer: Self-pay

## 2014-09-10 VITALS — BP 139/96 | HR 97 | Temp 98.7°F

## 2014-09-10 DIAGNOSIS — D709 Neutropenia, unspecified: Secondary | ICD-10-CM

## 2014-09-10 DIAGNOSIS — C562 Malignant neoplasm of left ovary: Secondary | ICD-10-CM

## 2014-09-10 LAB — CBC WITH DIFFERENTIAL/PLATELET
BASO%: 1.9 % (ref 0.0–2.0)
BASOS ABS: 0 10*3/uL (ref 0.0–0.1)
EOS ABS: 0.1 10*3/uL (ref 0.0–0.5)
EOS%: 8.6 % — ABNORMAL HIGH (ref 0.0–7.0)
HEMATOCRIT: 37.2 % (ref 34.8–46.6)
HEMOGLOBIN: 12 g/dL (ref 11.6–15.9)
LYMPH%: 56.4 % — ABNORMAL HIGH (ref 14.0–49.7)
MCH: 29.9 pg (ref 25.1–34.0)
MCHC: 32.3 g/dL (ref 31.5–36.0)
MCV: 92.6 fL (ref 79.5–101.0)
MONO#: 0.3 10*3/uL (ref 0.1–0.9)
MONO%: 17.7 % — ABNORMAL HIGH (ref 0.0–14.0)
NEUT%: 15.4 % — ABNORMAL LOW (ref 38.4–76.8)
NEUTROS ABS: 0.3 10*3/uL — AB (ref 1.5–6.5)
Platelets: 189 10*3/uL (ref 145–400)
RBC: 4.02 10*6/uL (ref 3.70–5.45)
RDW: 12.8 % (ref 11.2–14.5)
WBC: 1.7 10*3/uL — AB (ref 3.9–10.3)
lymph#: 1 10*3/uL (ref 0.9–3.3)

## 2014-09-10 MED ORDER — FILGRASTIM 300 MCG/0.5ML IJ SOSY: 300.0000 ug | PREFILLED_SYRINGE | Freq: Once | INTRAMUSCULAR | Status: DC

## 2014-09-10 MED ORDER — TBO-FILGRASTIM 300 MCG/0.5ML ~~LOC~~ SOSY
300.0000 ug | PREFILLED_SYRINGE | Freq: Once | SUBCUTANEOUS | Status: AC
  Administered 2014-09-10: 300 ug via SUBCUTANEOUS
  Filled 2014-09-10: qty 0.5

## 2014-09-10 NOTE — Progress Notes (Signed)
Per [employer] patient was approved for asst with Neupogen  09/09/14-09/09/15

## 2014-09-11 ENCOUNTER — Ambulatory Visit (HOSPITAL_BASED_OUTPATIENT_CLINIC_OR_DEPARTMENT_OTHER): Payer: Self-pay

## 2014-09-11 DIAGNOSIS — C562 Malignant neoplasm of left ovary: Secondary | ICD-10-CM

## 2014-09-11 DIAGNOSIS — Z5189 Encounter for other specified aftercare: Secondary | ICD-10-CM

## 2014-09-11 MED ORDER — FILGRASTIM 300 MCG/0.5ML IJ SOSY: 300.0000 ug | PREFILLED_SYRINGE | Freq: Once | INTRAMUSCULAR | Status: DC

## 2014-09-11 MED ORDER — TBO-FILGRASTIM 300 MCG/0.5ML ~~LOC~~ SOSY
300.0000 ug | PREFILLED_SYRINGE | Freq: Once | SUBCUTANEOUS | Status: AC
  Administered 2014-09-11: 300 ug via SUBCUTANEOUS

## 2014-09-11 NOTE — Progress Notes (Signed)
Order if for neupogen, pharmacy not present to change order to granix. Pt received granix last 2 days, order placed for granix

## 2014-09-13 ENCOUNTER — Ambulatory Visit: Payer: Self-pay

## 2014-09-13 ENCOUNTER — Other Ambulatory Visit (HOSPITAL_BASED_OUTPATIENT_CLINIC_OR_DEPARTMENT_OTHER): Payer: Self-pay

## 2014-09-13 DIAGNOSIS — C562 Malignant neoplasm of left ovary: Secondary | ICD-10-CM

## 2014-09-13 LAB — CBC WITH DIFFERENTIAL/PLATELET
BASO%: 2.3 % — AB (ref 0.0–2.0)
Basophils Absolute: 0.2 10*3/uL — ABNORMAL HIGH (ref 0.0–0.1)
EOS%: 2.7 % (ref 0.0–7.0)
Eosinophils Absolute: 0.3 10*3/uL (ref 0.0–0.5)
HCT: 37.5 % (ref 34.8–46.6)
HEMOGLOBIN: 12.3 g/dL (ref 11.6–15.9)
LYMPH#: 1.7 10*3/uL (ref 0.9–3.3)
LYMPH%: 16.6 % (ref 14.0–49.7)
MCH: 30.8 pg (ref 25.1–34.0)
MCHC: 32.8 g/dL (ref 31.5–36.0)
MCV: 94 fL (ref 79.5–101.0)
MONO#: 0.8 10*3/uL (ref 0.1–0.9)
MONO%: 7.8 % (ref 0.0–14.0)
NEUT#: 7.1 10*3/uL — ABNORMAL HIGH (ref 1.5–6.5)
NEUT%: 70.6 % (ref 38.4–76.8)
Platelets: 125 10*3/uL — ABNORMAL LOW (ref 145–400)
RBC: 3.99 10*6/uL (ref 3.70–5.45)
RDW: 13.3 % (ref 11.2–14.5)
WBC: 10 10*3/uL (ref 3.9–10.3)

## 2014-09-13 NOTE — Progress Notes (Signed)
Legna here for lab and possible injection.  Order to continue Granix and Cipro if ANC less than 1.1.  ANC today 7.1  Will stop granix and cipro.  Return as scheduled

## 2014-09-14 ENCOUNTER — Telehealth: Payer: Self-pay | Admitting: *Deleted

## 2014-09-14 NOTE — Telephone Encounter (Signed)
Called patient to clarify information as noted below by Roz, RN. She states that [employer] called her directly because the papers stated she could return to work from 08-31-14 to 03-03-15 for 6-8 hours, 1-2 times a week. Message sent to [employer] in managed care as papers were given to her.

## 2014-09-14 NOTE — Telephone Encounter (Signed)
Call received at 1305 for help with [employer] paper work to return to work.  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  What can be done to straighten this out? Patient ended call at 1307 while this nurse was investigating chart for letter.  "A call is coming through from you all so I will answer it.  Thanks for your help."

## 2014-09-15 ENCOUNTER — Other Ambulatory Visit: Payer: Self-pay | Admitting: Oncology

## 2014-09-16 ENCOUNTER — Encounter: Payer: Self-pay | Admitting: Oncology

## 2014-09-16 ENCOUNTER — Other Ambulatory Visit (HOSPITAL_BASED_OUTPATIENT_CLINIC_OR_DEPARTMENT_OTHER): Payer: Self-pay

## 2014-09-16 ENCOUNTER — Ambulatory Visit (HOSPITAL_BASED_OUTPATIENT_CLINIC_OR_DEPARTMENT_OTHER): Payer: Self-pay | Admitting: Oncology

## 2014-09-16 ENCOUNTER — Telehealth: Payer: Self-pay | Admitting: Oncology

## 2014-09-16 VITALS — BP 145/83 | HR 84 | Temp 99.0°F | Resp 18 | Ht 62.0 in | Wt 180.3 lb

## 2014-09-16 DIAGNOSIS — E8940 Asymptomatic postprocedural ovarian failure: Secondary | ICD-10-CM

## 2014-09-16 DIAGNOSIS — C562 Malignant neoplasm of left ovary: Secondary | ICD-10-CM

## 2014-09-16 DIAGNOSIS — T451X5A Adverse effect of antineoplastic and immunosuppressive drugs, initial encounter: Secondary | ICD-10-CM

## 2014-09-16 DIAGNOSIS — N838 Other noninflammatory disorders of ovary, fallopian tube and broad ligament: Secondary | ICD-10-CM

## 2014-09-16 DIAGNOSIS — D701 Agranulocytosis secondary to cancer chemotherapy: Secondary | ICD-10-CM

## 2014-09-16 LAB — CBC WITH DIFFERENTIAL/PLATELET
BASO%: 0.6 % (ref 0.0–2.0)
Basophils Absolute: 0 10*3/uL (ref 0.0–0.1)
EOS%: 1.3 % (ref 0.0–7.0)
Eosinophils Absolute: 0.1 10*3/uL (ref 0.0–0.5)
HCT: 38.1 % (ref 34.8–46.6)
HEMOGLOBIN: 12.8 g/dL (ref 11.6–15.9)
LYMPH%: 21.6 % (ref 14.0–49.7)
MCH: 30.8 pg (ref 25.1–34.0)
MCHC: 33.6 g/dL (ref 31.5–36.0)
MCV: 91.8 fL (ref 79.5–101.0)
MONO#: 0.7 10*3/uL (ref 0.1–0.9)
MONO%: 10 % (ref 0.0–14.0)
NEUT%: 66.5 % (ref 38.4–76.8)
NEUTROS ABS: 4.8 10*3/uL (ref 1.5–6.5)
Platelets: 126 10*3/uL — ABNORMAL LOW (ref 145–400)
RBC: 4.15 10*6/uL (ref 3.70–5.45)
RDW: 13.1 % (ref 11.2–14.5)
WBC: 7.2 10*3/uL (ref 3.9–10.3)
lymph#: 1.6 10*3/uL (ref 0.9–3.3)

## 2014-09-16 LAB — COMPREHENSIVE METABOLIC PANEL (CC13)
ALT: 31 U/L (ref 0–55)
ANION GAP: 10 meq/L (ref 3–11)
AST: 20 U/L (ref 5–34)
Albumin: 3.8 g/dL (ref 3.5–5.0)
Alkaline Phosphatase: 87 U/L (ref 40–150)
BILIRUBIN TOTAL: 0.21 mg/dL (ref 0.20–1.20)
BUN: 14.8 mg/dL (ref 7.0–26.0)
CALCIUM: 9.4 mg/dL (ref 8.4–10.4)
CHLORIDE: 105 meq/L (ref 98–109)
CO2: 26 mEq/L (ref 22–29)
Creatinine: 1 mg/dL (ref 0.6–1.1)
EGFR: 69 mL/min/{1.73_m2} — AB (ref >90)
Glucose: 94 mg/dl (ref 70–140)
POTASSIUM: 4.6 meq/L (ref 3.5–5.1)
Sodium: 141 mEq/L (ref 136–145)
TOTAL PROTEIN: 7.2 g/dL (ref 6.4–8.3)

## 2014-09-16 MED ORDER — DEXAMETHASONE 4 MG PO TABS: ORAL_TABLET | ORAL | Status: DC

## 2014-09-16 MED ORDER — LORAZEPAM 0.5 MG PO TABS: ORAL_TABLET | ORAL | Status: DC

## 2014-09-16 NOTE — Telephone Encounter (Signed)
per pof ot sch pt appt-gave pt copy of sch °

## 2014-09-16 NOTE — Progress Notes (Signed)
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 She had cycle 1 carbo taxol on 08-31-14, with ANC down to 0.1 by day 10, recovered by day 14 after 3 granix injections. She will have cycle 2 on 09-21-14 and will need granix beginning at least day 7. She is to see Dr Colonel Bald at [facility], but would be glad to have subsequent gyn onc follow up in [place] if possible.  Patient also had aches after granix, not improved with claritin but one percocet helpful; all aches have resolved now. She has had "headache" past 3 days, which seems to be painful scalp as she is now losing hair. Bowels are moving well. She had no fever or symptoms of infection when neutropenic, and has had no bleeding   Disability papers for work not completed correctly by staff and have been redone by MD today.  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  ONCOLOGIC HISTORY Patient had been in usual good health with entirely regular menstrual periods until indigestion and difficulty eating ~ late Nov 2015, then abdominal fullness and distension by late Dec. The abdominal swelling progressed such that she was seen by PCP at [facility], then ED at [facility] on 07-28-14. CT was obtained at [facility], reportedly with 26 x 12 cm cystic mass with enhancing septations and peripheral nodular soft tissue component, apparently arising from left ovary and occupying entire mid pelvis with deviation of otherwise normal appearing uterus; no ascites, omental cake, peritoneal carcinomatosis or adenopathy per CT. She was seen by Dr Armandina Stammer, with CA 125 68 and CEA normal. She was seen in consultation by Dr Denman George on 08-05-14 and, due to surgical availability, sent to Dr Suzan Slick at  [facility]. Surgery on 08-11-14 was exploratory laparotomy with TAH, BSO and lymphadenectomy, R0 at completion although the ovary had ruptured preoperatively. Intraoperatively the appendix, bowel, nodes and upper abdomen were unremarkable. She was DC on post op day 2 on lovenox, which she continues. 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 The primary tumor was 25 cm, no LVSI, 0/16 nodes involved appendix benign and pelvic washings positive (pT1c pN0; IC3). Case was presented at [facility] multidisciplinary conference, with consideration of FOLFOX vs carbo taxol; final recommendation was for [place] taxol. She had first carboplatin taxol on 08-31-14    Review of systems as above, also: No SOB or other respiratory symptoms. Hot flashes already improved. No abdominal or pelvic pain. No significant peripheral neuropathy.  Remainder of 10 point Review of Systems negative.  Objective:  Vital signs in last 24 hours:  BP 145/83 mmHg  Pulse 84  Temp(Src) 99 F (37.2 C) (Oral)  Resp 18  Ht $R'5\' 2"'Kx$  (1.575 m)  Wt 180 lb 4.8 oz (81.784 kg)  BMI 32.97 kg/m2  SpO2 99% Weight up 8 lbs Alert, oriented and appropriate. Ambulatory without difficulty.  Partial alopecia  HEENT:PERRL, sclerae not icteric. Oral mucosa moist without lesions, posterior pharynx clear.  Neck supple. No JVD.  Lymphatics:no cervical,supraclavicular adenopathy Resp: clear to auscultation bilaterally and normal percussion bilaterally Cardio: regular rate and rhythm. No gallop. GI: soft, nontender, not distended, no mass or organomegaly.  Normally active bowel sounds. Surgical incision not remarkable. Musculoskeletal/ Extremities: without pitting edema, cords, tenderness Neuro: no peripheral neuropathy. Otherwise nonfocal. PSYCH appropriate mood and affect Skin without rash,  ecchymosis, petechiae   Lab Results:  Results for orders placed or performed in visit on 09/16/14  CBC with Differential/Platelet  Result Value Ref Range   WBC 7.2 3.9 - 10.3 10e3/uL   NEUT# 4.8 1.5 - 6.5 10e3/uL   HGB 12.8 11.6 - 15.9 g/dL   HCT 38.1 34.8 - 46.6 %   Platelets 126 (L) 145 - 400 10e3/uL   MCV 91.8 79.5 - 101.0 fL   MCH 30.8 25.1 - 34.0 pg   MCHC 33.6 31.5 - 36.0 g/dL   RBC 4.15 3.70 - 5.45 10e6/uL   RDW 13.1 11.2 - 14.5 %   lymph# 1.6 0.9 - 3.3 10e3/uL   MONO# 0.7 0.1 - 0.9 10e3/uL   Eosinophils Absolute 0.1 0.0 - 0.5 10e3/uL   Basophils Absolute 0.0 0.0 - 0.1 10e3/uL   NEUT% 66.5 38.4 - 76.8 %   LYMPH% 21.6 14.0 - 49.7 %   MONO% 10.0 0.0 - 14.0 %   EOS% 1.3 0.0 - 7.0 %   BASO% 0.6 0.0 - 2.0 %  Comprehensive metabolic panel (Cmet) - [facility]  Result Value Ref Range   Sodium 141 136 - 145 mEq/L   Potassium 4.6 3.5 - 5.1 mEq/L   Chloride 105 98 - 109 mEq/L   CO2 26 22 - 29 mEq/L   Glucose 94 70 - 140 mg/dl   BUN 14.8 7.0 - 26.0 mg/dL   Creatinine 1.0 0.6 - 1.1 mg/dL   Total Bilirubin 0.21 0.20 - 1.20 mg/dL   Alkaline Phosphatase 87 40 - 150 U/L   AST 20 5 - 34 U/L   ALT 31 0 - 55 U/L   Total Protein 7.2 6.4 - 8.3 g/dL   Albumin 3.8 3.5 - 5.0 g/dL   Calcium 9.4 8.4 - 10.4 mg/dL   Anion Gap 10 3 - 11 mEq/L   EGFR 69 (L) >90 ml/min/1.73 m2     Studies/Results:  No results found.  Medications: I have reviewed the patient's current medications. Premedicaiton decadron available. OK to use percocet for significant aches.  DISCUSSION: scalp soreness, timing of taxol aches and granix administration discussed. Financial staff aware of no insurance.   Assessment/Plan: 1.IC3 mucinous adenocarcinoma of left ovary: preoperative rupture, surgery by gyn oncology at [facility] 08-11-14 (R0), and adjuvant carbo taxol begun 08-31-14. WIll treat cycle 2 on 09-21-14 as long as ANC >=1.5 and plt >=100k, with granix days 6-7-8. I will see her with labs 10-04-14. 2.surgically induced  menopause: symptoms improved 3.overdue mammograms, finding consistent with known cyst left breast. Will address as soon as possible. Message to staff re mammogram scholarship. 4.no colonoscopy 5.BTL and endometrial ablation 6.medication intolerances as noted 7.no flu vaccine 8.no advance directives, information given.   Chemo orders completed using today's chemistries, granix orders placed.  All questions answered and patient is in agreement with plans above Time spent 25 min including >50% counseling and coordination of care.    Gordy Levan, MD   09/16/2014, 4:36 PM

## 2014-09-16 NOTE — Telephone Encounter (Signed)
Appointments made and avs printred for pt  Alyssa [name]

## 2014-09-17 ENCOUNTER — Encounter: Payer: Self-pay | Admitting: Oncology

## 2014-09-17 NOTE — Progress Notes (Signed)
I left a message for patient to call me back. I need the date she went out of work.

## 2014-09-17 NOTE — Progress Notes (Signed)
I called again and left message for patient to let me know when as her last day at work. She left me a message

## 2014-09-17 NOTE — Progress Notes (Signed)
I spoke with the patient and she can't remember the exact day she last worked, but knew it was 08/2014. She wants copy of form going to [employer](leaving upfront with Ms. Wilma). I faxed to [phone]

## 2014-09-21 ENCOUNTER — Other Ambulatory Visit (HOSPITAL_BASED_OUTPATIENT_CLINIC_OR_DEPARTMENT_OTHER): Payer: Self-pay

## 2014-09-21 ENCOUNTER — Ambulatory Visit (HOSPITAL_BASED_OUTPATIENT_CLINIC_OR_DEPARTMENT_OTHER): Payer: Self-pay

## 2014-09-21 DIAGNOSIS — C562 Malignant neoplasm of left ovary: Secondary | ICD-10-CM

## 2014-09-21 DIAGNOSIS — Z5111 Encounter for antineoplastic chemotherapy: Secondary | ICD-10-CM

## 2014-09-21 LAB — CBC WITH DIFFERENTIAL/PLATELET
BASO%: 0.4 % (ref 0.0–2.0)
Basophils Absolute: 0 10*3/uL (ref 0.0–0.1)
EOS ABS: 0 10*3/uL (ref 0.0–0.5)
EOS%: 0.1 % (ref 0.0–7.0)
HCT: 40.2 % (ref 34.8–46.6)
HGB: 13.2 g/dL (ref 11.6–15.9)
LYMPH%: 8.3 % — AB (ref 14.0–49.7)
MCH: 30.2 pg (ref 25.1–34.0)
MCHC: 32.9 g/dL (ref 31.5–36.0)
MCV: 91.7 fL (ref 79.5–101.0)
MONO#: 0 10*3/uL — ABNORMAL LOW (ref 0.1–0.9)
MONO%: 0.5 % (ref 0.0–14.0)
NEUT%: 90.7 % — ABNORMAL HIGH (ref 38.4–76.8)
NEUTROS ABS: 3.7 10*3/uL (ref 1.5–6.5)
PLATELETS: 275 10*3/uL (ref 145–400)
RBC: 4.38 10*6/uL (ref 3.70–5.45)
RDW: 13.6 % (ref 11.2–14.5)
WBC: 4.1 10*3/uL (ref 3.9–10.3)
lymph#: 0.3 10*3/uL — ABNORMAL LOW (ref 0.9–3.3)

## 2014-09-21 MED ORDER — PACLITAXEL CHEMO INJECTION 300 MG/50ML
175.0000 mg/m2 | Freq: Once | INTRAVENOUS | Status: AC
  Administered 2014-09-21: 330 mg via INTRAVENOUS
  Filled 2014-09-21: qty 55

## 2014-09-21 MED ORDER — FAMOTIDINE IN NACL 20-0.9 MG/50ML-% IV SOLN
20.0000 mg | Freq: Once | INTRAVENOUS | Status: AC
  Administered 2014-09-21: 20 mg via INTRAVENOUS

## 2014-09-21 MED ORDER — ACETAMINOPHEN 325 MG PO TABS
650.0000 mg | ORAL_TABLET | Freq: Once | ORAL | Status: AC
  Administered 2014-09-21: 650 mg via ORAL

## 2014-09-21 MED ORDER — FAMOTIDINE IN NACL 20-0.9 MG/50ML-% IV SOLN
INTRAVENOUS | Status: AC
  Filled 2014-09-21: qty 50

## 2014-09-21 MED ORDER — SODIUM CHLORIDE 0.9 % IV SOLN
Freq: Once | INTRAVENOUS | Status: AC
  Administered 2014-09-21: 11:00:00 via INTRAVENOUS
  Filled 2014-09-21: qty 8

## 2014-09-21 MED ORDER — DIPHENHYDRAMINE HCL 50 MG/ML IJ SOLN
INTRAMUSCULAR | Status: AC
  Filled 2014-09-21: qty 1

## 2014-09-21 MED ORDER — ACETAMINOPHEN 325 MG PO TABS
ORAL_TABLET | ORAL | Status: AC
  Filled 2014-09-21: qty 2

## 2014-09-21 MED ORDER — SODIUM CHLORIDE 0.9 % IV SOLN
Freq: Once | INTRAVENOUS | Status: AC
  Administered 2014-09-21: 11:00:00 via INTRAVENOUS

## 2014-09-21 MED ORDER — DIPHENHYDRAMINE HCL 50 MG/ML IJ SOLN
50.0000 mg | Freq: Once | INTRAMUSCULAR | Status: AC
  Administered 2014-09-21: 50 mg via INTRAVENOUS

## 2014-09-21 MED ORDER — SODIUM CHLORIDE 0.9 % IV SOLN
654.0000 mg | Freq: Once | INTRAVENOUS | Status: AC
  Administered 2014-09-21: 650 mg via INTRAVENOUS
  Filled 2014-09-21: qty 65

## 2014-09-21 NOTE — Progress Notes (Signed)
Patient took her decadron 12hrs. And 6hrs at home prior to treatment. 1335 Pt. C/o headache.  She thinks it b/c she has not had any caffeine, 1337. Tylenol given. 1444 Patient states her headache is much better.

## 2014-09-21 NOTE — Patient Instructions (Signed)
[place] Discharge Instructions for Patients Receiving Chemotherapy  Today you received the following chemotherapy agents:  Taxol and Carboplatin  To help prevent nausea and vomiting after your treatment, we encourage you to take your nausea medication as ordered per MD.   If you develop nausea and vomiting that is not controlled by your nausea medication, call the clinic.   BELOW ARE SYMPTOMS THAT SHOULD BE REPORTED IMMEDIATELY:  *FEVER GREATER THAN 100.5 F  *CHILLS WITH OR WITHOUT FEVER  NAUSEA AND VOMITING THAT IS NOT CONTROLLED WITH YOUR NAUSEA MEDICATION  *UNUSUAL SHORTNESS OF BREATH  *UNUSUAL BRUISING OR BLEEDING  TENDERNESS IN MOUTH AND THROAT WITH OR WITHOUT PRESENCE OF ULCERS  *URINARY PROBLEMS  *BOWEL PROBLEMS  UNUSUAL RASH Items with * indicate a potential emergency and should be followed up as soon as possible.  Feel free to call the clinic you have any questions or concerns. The clinic phone number is [phone].    Last dose of Lovenox on 09/05/14 per Dr. Marko Plume.  You will see Dr. Marko Plume and have lab work on 09/06/14.

## 2014-09-27 ENCOUNTER — Ambulatory Visit (HOSPITAL_BASED_OUTPATIENT_CLINIC_OR_DEPARTMENT_OTHER): Payer: Self-pay

## 2014-09-27 DIAGNOSIS — C562 Malignant neoplasm of left ovary: Secondary | ICD-10-CM

## 2014-09-27 DIAGNOSIS — Z5189 Encounter for other specified aftercare: Secondary | ICD-10-CM

## 2014-09-27 MED ORDER — FILGRASTIM 300 MCG/0.5ML IJ SOSY
300.0000 ug | PREFILLED_SYRINGE | Freq: Once | INTRAMUSCULAR | Status: AC
  Administered 2014-09-27: 300 ug via SUBCUTANEOUS
  Filled 2014-09-27: qty 0.5

## 2014-09-28 ENCOUNTER — Ambulatory Visit (HOSPITAL_BASED_OUTPATIENT_CLINIC_OR_DEPARTMENT_OTHER): Payer: Self-pay

## 2014-09-28 DIAGNOSIS — Z5189 Encounter for other specified aftercare: Secondary | ICD-10-CM

## 2014-09-28 DIAGNOSIS — C562 Malignant neoplasm of left ovary: Secondary | ICD-10-CM

## 2014-09-28 MED ORDER — FILGRASTIM 300 MCG/0.5ML IJ SOSY
300.0000 ug | PREFILLED_SYRINGE | Freq: Once | INTRAMUSCULAR | Status: AC
  Administered 2014-09-28: 300 ug via SUBCUTANEOUS
  Filled 2014-09-28: qty 0.5

## 2014-09-29 ENCOUNTER — Ambulatory Visit (HOSPITAL_BASED_OUTPATIENT_CLINIC_OR_DEPARTMENT_OTHER): Payer: Self-pay

## 2014-09-29 DIAGNOSIS — Z5189 Encounter for other specified aftercare: Secondary | ICD-10-CM

## 2014-09-29 DIAGNOSIS — C562 Malignant neoplasm of left ovary: Secondary | ICD-10-CM

## 2014-09-29 MED ORDER — FILGRASTIM 300 MCG/0.5ML IJ SOSY
300.0000 ug | PREFILLED_SYRINGE | Freq: Once | INTRAMUSCULAR | Status: AC
  Administered 2014-09-29: 300 ug via SUBCUTANEOUS
  Filled 2014-09-29: qty 0.5

## 2014-10-01 ENCOUNTER — Other Ambulatory Visit: Payer: Self-pay

## 2014-10-01 ENCOUNTER — Other Ambulatory Visit: Payer: Self-pay | Admitting: Oncology

## 2014-10-01 DIAGNOSIS — N838 Other noninflammatory disorders of ovary, fallopian tube and broad ligament: Secondary | ICD-10-CM

## 2014-10-01 DIAGNOSIS — C562 Malignant neoplasm of left ovary: Secondary | ICD-10-CM

## 2014-10-01 MED ORDER — LORAZEPAM 0.5 MG PO TABS
ORAL_TABLET | ORAL | Status: DC
Start: 2014-10-01 — End: 2014-10-29

## 2014-10-04 ENCOUNTER — Encounter: Payer: Self-pay | Admitting: Oncology

## 2014-10-04 ENCOUNTER — Other Ambulatory Visit (HOSPITAL_BASED_OUTPATIENT_CLINIC_OR_DEPARTMENT_OTHER): Payer: Self-pay

## 2014-10-04 ENCOUNTER — Ambulatory Visit (HOSPITAL_BASED_OUTPATIENT_CLINIC_OR_DEPARTMENT_OTHER): Payer: Self-pay | Admitting: Oncology

## 2014-10-04 ENCOUNTER — Telehealth: Payer: Self-pay | Admitting: Oncology

## 2014-10-04 VITALS — BP 149/90 | HR 81 | Temp 98.3°F | Resp 18 | Ht 62.0 in | Wt 181.0 lb

## 2014-10-04 DIAGNOSIS — C562 Malignant neoplasm of left ovary: Secondary | ICD-10-CM

## 2014-10-04 DIAGNOSIS — N838 Other noninflammatory disorders of ovary, fallopian tube and broad ligament: Secondary | ICD-10-CM

## 2014-10-04 DIAGNOSIS — E8940 Asymptomatic postprocedural ovarian failure: Secondary | ICD-10-CM

## 2014-10-04 DIAGNOSIS — D701 Agranulocytosis secondary to cancer chemotherapy: Secondary | ICD-10-CM

## 2014-10-04 DIAGNOSIS — T451X5A Adverse effect of antineoplastic and immunosuppressive drugs, initial encounter: Secondary | ICD-10-CM

## 2014-10-04 DIAGNOSIS — R55 Syncope and collapse: Secondary | ICD-10-CM

## 2014-10-04 LAB — CBC WITH DIFFERENTIAL/PLATELET
BASO%: 1.1 % (ref 0.0–2.0)
Basophils Absolute: 0.1 10*3/uL (ref 0.0–0.1)
EOS%: 3.6 % (ref 0.0–7.0)
Eosinophils Absolute: 0.2 10*3/uL (ref 0.0–0.5)
HEMATOCRIT: 36.2 % (ref 34.8–46.6)
HEMOGLOBIN: 12.2 g/dL (ref 11.6–15.9)
LYMPH%: 30.7 % (ref 14.0–49.7)
MCH: 31 pg (ref 25.1–34.0)
MCHC: 33.7 g/dL (ref 31.5–36.0)
MCV: 91.9 fL (ref 79.5–101.0)
MONO#: 0.4 10*3/uL (ref 0.1–0.9)
MONO%: 8.4 % (ref 0.0–14.0)
NEUT#: 2.5 10*3/uL (ref 1.5–6.5)
NEUT%: 56.2 % (ref 38.4–76.8)
PLATELETS: 167 10*3/uL (ref 145–400)
RBC: 3.94 10*6/uL (ref 3.70–5.45)
RDW: 13.7 % (ref 11.2–14.5)
WBC: 4.4 10*3/uL (ref 3.9–10.3)
lymph#: 1.4 10*3/uL (ref 0.9–3.3)

## 2014-10-04 LAB — COMPREHENSIVE METABOLIC PANEL (CC13)
ALBUMIN: 3.7 g/dL (ref 3.5–5.0)
ALK PHOS: 84 U/L (ref 40–150)
ALT: 26 U/L (ref 0–55)
AST: 21 U/L (ref 5–34)
Anion Gap: 10 mEq/L (ref 3–11)
BUN: 16.9 mg/dL (ref 7.0–26.0)
CALCIUM: 9.2 mg/dL (ref 8.4–10.4)
CO2: 25 mEq/L (ref 22–29)
CREATININE: 0.8 mg/dL (ref 0.6–1.1)
Chloride: 105 mEq/L (ref 98–109)
EGFR: 80 mL/min/{1.73_m2} — ABNORMAL LOW (ref >90)
Glucose: 102 mg/dl (ref 70–140)
Potassium: 4.2 mEq/L (ref 3.5–5.1)
SODIUM: 141 meq/L (ref 136–145)
Total Bilirubin: <0.2 mg/dL (ref 0.20–1.20)
Total Protein: 7 g/dL (ref 6.4–8.3)

## 2014-10-04 MED ORDER — DEXAMETHASONE 4 MG PO TABS: ORAL_TABLET | ORAL | Status: DC

## 2014-10-04 NOTE — Progress Notes (Signed)
OFFICE PROGRESS NOTE   October 04, 2014   Physicians:Emma Rossi/ [place] Bae-Jump,C.Gaccione  INTERVAL HISTORY:   Patient is seen, alone for visit, in continuing attention to adjuvant chemotherapy for IC3 mucinous adenocarcinoma of left ovary, having had cycle 2 carbo taxol on 09-21-14 with granix x3 beginning 09-27-14. She saw Dr Colonel Bald at [facility] on 09-17-14 and will see her or Dr Denman George after chemo completes.   Patient is feeling well now, tho she had more nausea x ~ 5 days after cycle 2 chemo, possibly related to eating fried foods night of treatment. Prn anitemetics were helpful, tho she used only 4 mg zofran, and bowels moved well during that time. Aches from taxol and granix have not been bothersome. She had minimal peripheral neuropathy fingers and feet after treatment, all resolved with exception of right index finger and right foot.     No PAC No flu vaccine No genetics evaluation   ONCOLOGIC HISTORY Patient had been in usual good health with entirely regular menstrual periods until indigestion and difficulty eating ~ late Nov 2015, then abdominal fullness and distension by late Dec. The abdominal swelling progressed such that she was seen by PCP at [facility], then ED at [facility] on 07-28-14. CT was obtained at [facility], reportedly with 26 x 12 cm cystic mass with enhancing septations and peripheral nodular soft tissue component, apparently arising from left ovary and occupying entire mid pelvis with deviation of otherwise normal appearing uterus; no ascites, omental cake, peritoneal carcinomatosis or adenopathy per CT. She was seen by Dr Armandina Stammer, with CA 125 68 and CEA normal. She was seen in consultation by Dr Denman George on 08-05-14 and, due to surgical availability, sent to Dr Suzan Slick at [facility]. Surgery on 08-11-14 was exploratory laparotomy with TAH, BSO and lymphadenectomy, R0 at completion although the ovary had ruptured preoperatively. Intraoperatively the appendix,  bowel, nodes and upper abdomen were unremarkable. She was DC on post op day 2 on lovenox, which she continues. [facility] surgical pathology [id number] 1) from 08-11-14 found well differentiated mucinous adenocarcinoma of left ovary intestinal type with expansile pattern and associated mucinous borderline tumor also in the ovary. The primary tumor was 25 cm, no LVSI, 0/16 nodes involved appendix benign and pelvic washings positive (pT1c pN0; IC3). Case was presented at [facility] multidisciplinary conference, with consideration of FOLFOX vs carbo taxol; final recommendation was for [place] taxol. She had first carboplatin taxol on 08-31-14       Review of systems as above, also: No fever or symptoms of infection. No respiratory or bladder symptoms. No bleeding. Appetite now is good. Peripheral IV access tolerated well. Frequent hot flashes Remainder of 10 point Review of Systems negative.  Objective:  Vital signs in last 24 hours:  BP 149/90 mmHg  Pulse 81  Temp(Src) 98.3 F (36.8 C) (Oral)  Resp 18  Ht 5' 2" (1.575 m)  Wt 181 lb (82.101 kg)  BMI 33.10 kg/m2  SpO2 99% Weight up 0.5 lb Alert, oriented and appropriate. Ambulatory without difficulty.  Alopecia  HEENT:PERRL, sclerae not icteric. Oral mucosa moist without lesions, posterior pharynx clear.  Neck supple. No JVD.  Lymphatics:no cervical,supraclavicular, axillary or inguinal adenopathy Resp: clear to auscultation bilaterally and normal percussion bilaterally Cardio: regular rate and rhythm. No gallop. GI: abdomen soft, nontender, not distended, no mass or organomegaly. Normally active bowel sounds. Surgical incision not remarkable. Musculoskeletal/ Extremities: without pitting edema, cords, tenderness Neuro: no peripheral neuropathy. Otherwise nonfocal Skin without rash, ecchymosis, petechiae   Lab  Results:  Results for orders placed or performed in visit on 10/04/14  CBC with Differential  Result Value Ref Range   WBC 4.4 3.9 - 10.3  10e3/uL   NEUT# 2.5 1.5 - 6.5 10e3/uL   HGB 12.2 11.6 - 15.9 g/dL   HCT 36.2 34.8 - 46.6 %   Platelets 167 145 - 400 10e3/uL   MCV 91.9 79.5 - 101.0 fL   MCH 31.0 25.1 - 34.0 pg   MCHC 33.7 31.5 - 36.0 g/dL   RBC 3.94 3.70 - 5.45 10e6/uL   RDW 13.7 11.2 - 14.5 %   lymph# 1.4 0.9 - 3.3 10e3/uL   MONO# 0.4 0.1 - 0.9 10e3/uL   Eosinophils Absolute 0.2 0.0 - 0.5 10e3/uL   Basophils Absolute 0.1 0.0 - 0.1 10e3/uL   NEUT% 56.2 38.4 - 76.8 %   LYMPH% 30.7 14.0 - 49.7 %   MONO% 8.4 0.0 - 14.0 %   EOS% 3.6 0.0 - 7.0 %   BASO% 1.1 0.0 - 2.0 %  Comprehensive metabolic panel (Cmet) - [facility]  Result Value Ref Range   Sodium 141 136 - 145 mEq/L   Potassium 4.2 3.5 - 5.1 mEq/L   Chloride 105 98 - 109 mEq/L   CO2 25 22 - 29 mEq/L   Glucose 102 70 - 140 mg/dl   BUN 16.9 7.0 - 26.0 mg/dL   Creatinine 0.8 0.6 - 1.1 mg/dL   Total Bilirubin <0.20 0.20 - 1.20 mg/dL   Alkaline Phosphatase 84 40 - 150 U/L   AST 21 5 - 34 U/L   ALT 26 0 - 55 U/L   Total Protein 7.0 6.4 - 8.3 g/dL   Albumin 3.7 3.5 - 5.0 g/dL   Calcium 9.2 8.4 - 10.4 mg/dL   Anion Gap 10 3 - 11 mEq/L   EGFR 80 (L) >90 ml/min/1.73 m2     Studies/Results:  No results found.  Medications: I have reviewed the patient's current medications. May want to try 8 mg zofran if nausea after next cycle chemo; consider adding EMEND if delayed nausea continues to be difficult (note no insurance).  DISCUSSION: patient is pleased that she continues to tolerate treatment this well and is in agreement with continuing as planned. Counts likely will be good for treatment with cycle 3 on 10-12-14 and she prefers checking CBC that day to coming for counts day prior. She will have granix x 3 days 4-18,19,20.  Assessment/Plan: 1.IC3 mucinous adenocarcinoma of left ovary: preoperative rupture, surgery by gyn oncology at [facility] 08-11-14 (R0), and adjuvant carbo taxol begun 08-31-14. WIll treat cycle 3 on 10-12-14 as long as ANC >=1.5 and plt >=100k, with granix  days 6-7-8. I will see her with labs on 10-18-14.  2.surgically induced menopause: frequent hot flashes despite Vitamin E. Encouraged pushing po fluids. 3.overdue mammograms, finding consistent with known cyst left breast. Will address as soon as possible. Message to staff re mammogram scholarship. 4.no colonoscopy 5.BTL and endometrial ablation 6.medication intolerances as noted 7.no flu vaccine 8.no advance directives, information given.   Chemo and granix orders entered. All questions anseered and she knows to call if needed prior to next scheduled visits. Time spent 25 min including >50% counseling and coordination of care.    Parneet Glantz P, MD   10/04/2014, 1:33 PM

## 2014-10-04 NOTE — Telephone Encounter (Signed)
Appointments made and avs printed for patient °

## 2014-10-05 DIAGNOSIS — R55 Syncope and collapse: Secondary | ICD-10-CM | POA: Insufficient documentation

## 2014-10-05 DIAGNOSIS — T451X5A Adverse effect of antineoplastic and immunosuppressive drugs, initial encounter: Secondary | ICD-10-CM | POA: Insufficient documentation

## 2014-10-05 DIAGNOSIS — D701 Agranulocytosis secondary to cancer chemotherapy: Secondary | ICD-10-CM | POA: Insufficient documentation

## 2014-10-12 ENCOUNTER — Other Ambulatory Visit (HOSPITAL_BASED_OUTPATIENT_CLINIC_OR_DEPARTMENT_OTHER): Payer: Self-pay

## 2014-10-12 ENCOUNTER — Ambulatory Visit (HOSPITAL_BASED_OUTPATIENT_CLINIC_OR_DEPARTMENT_OTHER): Payer: Self-pay

## 2014-10-12 DIAGNOSIS — C562 Malignant neoplasm of left ovary: Secondary | ICD-10-CM

## 2014-10-12 DIAGNOSIS — Z5111 Encounter for antineoplastic chemotherapy: Secondary | ICD-10-CM

## 2014-10-12 LAB — COMPREHENSIVE METABOLIC PANEL (CC13)
ALK PHOS: 85 U/L (ref 40–150)
ALT: 20 U/L (ref 0–55)
AST: 18 U/L (ref 5–34)
Albumin: 4.2 g/dL (ref 3.5–5.0)
Anion Gap: 11 mEq/L (ref 3–11)
BILIRUBIN TOTAL: 0.42 mg/dL (ref 0.20–1.20)
BUN: 15.9 mg/dL (ref 7.0–26.0)
CO2: 25 meq/L (ref 22–29)
CREATININE: 1 mg/dL (ref 0.6–1.1)
Calcium: 10.2 mg/dL (ref 8.4–10.4)
Chloride: 102 mEq/L (ref 98–109)
EGFR: 68 mL/min/{1.73_m2} — AB (ref >90)
Glucose: 208 mg/dl — ABNORMAL HIGH (ref 70–140)
Potassium: 4.4 mEq/L (ref 3.5–5.1)
Sodium: 138 mEq/L (ref 136–145)
Total Protein: 8.1 g/dL (ref 6.4–8.3)

## 2014-10-12 LAB — CBC WITH DIFFERENTIAL/PLATELET
BASO%: 0 % (ref 0.0–2.0)
Basophils Absolute: 0 10*3/uL (ref 0.0–0.1)
EOS ABS: 0 10*3/uL (ref 0.0–0.5)
EOS%: 0 % (ref 0.0–7.0)
HCT: 39.6 % (ref 34.8–46.6)
HGB: 13.5 g/dL (ref 11.6–15.9)
LYMPH#: 0.3 10*3/uL — AB (ref 0.9–3.3)
LYMPH%: 7.2 % — ABNORMAL LOW (ref 14.0–49.7)
MCH: 31.1 pg (ref 25.1–34.0)
MCHC: 34.1 g/dL (ref 31.5–36.0)
MCV: 91.2 fL (ref 79.5–101.0)
MONO#: 0 10*3/uL — ABNORMAL LOW (ref 0.1–0.9)
MONO%: 0.5 % (ref 0.0–14.0)
NEUT%: 92.3 % — ABNORMAL HIGH (ref 38.4–76.8)
NEUTROS ABS: 3.7 10*3/uL (ref 1.5–6.5)
Platelets: 261 10*3/uL (ref 145–400)
RBC: 4.34 10*6/uL (ref 3.70–5.45)
RDW: 14.2 % (ref 11.2–14.5)
WBC: 4 10*3/uL (ref 3.9–10.3)

## 2014-10-12 MED ORDER — DIPHENHYDRAMINE HCL 50 MG/ML IJ SOLN
INTRAMUSCULAR | Status: AC
  Filled 2014-10-12: qty 1

## 2014-10-12 MED ORDER — SODIUM CHLORIDE 0.9 % IV SOLN
Freq: Once | INTRAVENOUS | Status: AC
  Administered 2014-10-12: 10:00:00 via INTRAVENOUS

## 2014-10-12 MED ORDER — FAMOTIDINE IN NACL 20-0.9 MG/50ML-% IV SOLN
20.0000 mg | Freq: Once | INTRAVENOUS | Status: AC
  Administered 2014-10-12: 20 mg via INTRAVENOUS

## 2014-10-12 MED ORDER — DIPHENHYDRAMINE HCL 50 MG/ML IJ SOLN
50.0000 mg | Freq: Once | INTRAMUSCULAR | Status: AC
  Administered 2014-10-12: 50 mg via INTRAVENOUS

## 2014-10-12 MED ORDER — SODIUM CHLORIDE 0.9 % IV SOLN
Freq: Once | INTRAVENOUS | Status: AC
  Administered 2014-10-12: 11:00:00 via INTRAVENOUS
  Filled 2014-10-12: qty 8

## 2014-10-12 MED ORDER — SODIUM CHLORIDE 0.9 % IV SOLN
650.0000 mg | Freq: Once | INTRAVENOUS | Status: AC
  Administered 2014-10-12: 650 mg via INTRAVENOUS
  Filled 2014-10-12: qty 65

## 2014-10-12 MED ORDER — FAMOTIDINE IN NACL 20-0.9 MG/50ML-% IV SOLN
INTRAVENOUS | Status: AC
  Filled 2014-10-12: qty 50

## 2014-10-12 MED ORDER — PACLITAXEL CHEMO INJECTION 300 MG/50ML
175.0000 mg/m2 | Freq: Once | INTRAVENOUS | Status: AC
  Administered 2014-10-12: 330 mg via INTRAVENOUS
  Filled 2014-10-12: qty 55

## 2014-10-12 NOTE — Patient Instructions (Signed)
[place] Discharge Instructions for Patients Receiving Chemotherapy  Today you received the following chemotherapy agents:  Taxol and Carboplatin  To help prevent nausea and vomiting after your treatment, we encourage you to take your nausea medication as ordered per MD.   If you develop nausea and vomiting that is not controlled by your nausea medication, call the clinic.   BELOW ARE SYMPTOMS THAT SHOULD BE REPORTED IMMEDIATELY:  *FEVER GREATER THAN 100.5 F  *CHILLS WITH OR WITHOUT FEVER  NAUSEA AND VOMITING THAT IS NOT CONTROLLED WITH YOUR NAUSEA MEDICATION  *UNUSUAL SHORTNESS OF BREATH  *UNUSUAL BRUISING OR BLEEDING  TENDERNESS IN MOUTH AND THROAT WITH OR WITHOUT PRESENCE OF ULCERS  *URINARY PROBLEMS  *BOWEL PROBLEMS  UNUSUAL RASH Items with * indicate a potential emergency and should be followed up as soon as possible.  Feel free to call the clinic you have any questions or concerns. The clinic phone number is [phone].    Last dose of Lovenox on 09/05/14 per Dr. Marko Plume.  You will see Dr. Marko Plume and have lab work on 09/06/14.

## 2014-10-13 LAB — CA 125: CA 125: 15 U/mL (ref <35)

## 2014-10-17 ENCOUNTER — Other Ambulatory Visit: Payer: Self-pay | Admitting: Oncology

## 2014-10-18 ENCOUNTER — Ambulatory Visit (HOSPITAL_BASED_OUTPATIENT_CLINIC_OR_DEPARTMENT_OTHER): Payer: Self-pay | Admitting: Oncology

## 2014-10-18 ENCOUNTER — Ambulatory Visit (HOSPITAL_BASED_OUTPATIENT_CLINIC_OR_DEPARTMENT_OTHER): Payer: Self-pay

## 2014-10-18 ENCOUNTER — Encounter: Payer: Self-pay | Admitting: Oncology

## 2014-10-18 ENCOUNTER — Telehealth: Payer: Self-pay | Admitting: Oncology

## 2014-10-18 ENCOUNTER — Other Ambulatory Visit (HOSPITAL_BASED_OUTPATIENT_CLINIC_OR_DEPARTMENT_OTHER): Payer: Self-pay

## 2014-10-18 VITALS — BP 144/95 | HR 99 | Temp 98.0°F | Resp 18 | Ht 62.0 in | Wt 181.5 lb

## 2014-10-18 DIAGNOSIS — B370 Candidal stomatitis: Secondary | ICD-10-CM | POA: Insufficient documentation

## 2014-10-18 DIAGNOSIS — G620 Drug-induced polyneuropathy: Secondary | ICD-10-CM

## 2014-10-18 DIAGNOSIS — T451X5A Adverse effect of antineoplastic and immunosuppressive drugs, initial encounter: Secondary | ICD-10-CM

## 2014-10-18 DIAGNOSIS — C562 Malignant neoplasm of left ovary: Secondary | ICD-10-CM

## 2014-10-18 DIAGNOSIS — Z5189 Encounter for other specified aftercare: Secondary | ICD-10-CM

## 2014-10-18 DIAGNOSIS — G622 Polyneuropathy due to other toxic agents: Secondary | ICD-10-CM

## 2014-10-18 DIAGNOSIS — E8940 Asymptomatic postprocedural ovarian failure: Secondary | ICD-10-CM

## 2014-10-18 DIAGNOSIS — D701 Agranulocytosis secondary to cancer chemotherapy: Secondary | ICD-10-CM

## 2014-10-18 LAB — CBC WITH DIFFERENTIAL/PLATELET
BASO%: 1.6 % (ref 0.0–2.0)
BASOS ABS: 0 10*3/uL (ref 0.0–0.1)
EOS ABS: 0.2 10*3/uL (ref 0.0–0.5)
EOS%: 11.7 % — ABNORMAL HIGH (ref 0.0–7.0)
HCT: 35.5 % (ref 34.8–46.6)
HEMOGLOBIN: 11.8 g/dL (ref 11.6–15.9)
LYMPH#: 0.8 10*3/uL — AB (ref 0.9–3.3)
LYMPH%: 39.1 % (ref 14.0–49.7)
MCH: 30.5 pg (ref 25.1–34.0)
MCHC: 33.3 g/dL (ref 31.5–36.0)
MCV: 91.7 fL (ref 79.5–101.0)
MONO#: 0 10*3/uL — ABNORMAL LOW (ref 0.1–0.9)
MONO%: 1.4 % (ref 0.0–14.0)
NEUT#: 1 10*3/uL — ABNORMAL LOW (ref 1.5–6.5)
NEUT%: 46.2 % (ref 38.4–76.8)
Platelets: 234 10*3/uL (ref 145–400)
RBC: 3.88 10*6/uL (ref 3.70–5.45)
RDW: 14.6 % — AB (ref 11.2–14.5)
WBC: 2.1 10*3/uL — ABNORMAL LOW (ref 3.9–10.3)

## 2014-10-18 LAB — BASIC METABOLIC PANEL (CC13)
Anion Gap: 11 mEq/L (ref 3–11)
BUN: 20 mg/dL (ref 7.0–26.0)
CALCIUM: 9.6 mg/dL (ref 8.4–10.4)
CO2: 26 mEq/L (ref 22–29)
Chloride: 104 mEq/L (ref 98–109)
Creatinine: 0.8 mg/dL (ref 0.6–1.1)
EGFR: 81 mL/min/{1.73_m2} — AB (ref >90)
Glucose: 84 mg/dl (ref 70–140)
Potassium: 4.5 mEq/L (ref 3.5–5.1)
Sodium: 141 mEq/L (ref 136–145)

## 2014-10-18 MED ORDER — TBO-FILGRASTIM 300 MCG/0.5ML ~~LOC~~ SOSY
300.0000 ug | PREFILLED_SYRINGE | Freq: Once | SUBCUTANEOUS | Status: AC
  Administered 2014-10-18: 300 ug via SUBCUTANEOUS
  Filled 2014-10-18: qty 0.5

## 2014-10-18 MED ORDER — FLUCONAZOLE 100 MG PO TABS: 100.0000 mg | ORAL_TABLET | Freq: Every day | ORAL | Status: DC

## 2014-10-18 NOTE — Telephone Encounter (Signed)
per pof to sch pt appt-gave pt copy of sch °

## 2014-10-18 NOTE — Progress Notes (Signed)
OFFICE PROGRESS NOTE   October 18, 2014   Physicians:Emma Rossi/ [place] Bae-Jump,C.Gaccione  INTERVAL HISTORY:  Patient is seen, alone for visit, in continuing attention to adjuvant chemotherapy in process for IC3 mucinous adenocarcinoma of left ovary. She had cycle 3 carboplatin taxol on 10-12-14, and will begin 3 days of granix today.  Patient is fatigued, likely from [place] 1.0 today, but otherwise has not had difficulty managing side effects of chemotherapy this cycle. She did not have significant nausea using antiemetics regularly since chemo, and will try changing these to prn now. Bowels are moving daily without medication. Taste is altered tho she is eating, needs to increase po fluids. States mouth "coated" after each chemo.Aches were mostly in legs and back, mostly resolved now. She has no neuropathy in fingers, some in feet which improves with massage. She has had small areas of muscle spasm in LE intermittently, discussed adding gatorade or juices daily.     No PAC No flu vaccine No genetics evaluation   Short term disability has been approved thru end of June. Insurance coverage is still being reviewed.   ONCOLOGIC HISTORY Patient had been in usual good health with entirely regular menstrual periods until indigestion and difficulty eating ~ late Nov 2015, then abdominal fullness and distension by late Dec. The abdominal swelling progressed such that she was seen by PCP at [facility], then ED at [facility] on 07-28-14. CT was obtained at [facility], reportedly with 26 x 12 cm cystic mass with enhancing septations and peripheral nodular soft tissue component, apparently arising from left ovary and occupying entire mid pelvis with deviation of otherwise normal appearing uterus; no ascites, omental cake, peritoneal carcinomatosis or adenopathy per CT. She was seen by Dr Armandina Stammer, with CA 125 68 and CEA normal. She was seen in consultation by Dr Denman George on 08-05-14 and, due to  surgical availability, sent to Dr Suzan Slick at [facility]. Surgery on 08-11-14 was exploratory laparotomy with TAH, BSO and lymphadenectomy, R0 at completion although the ovary had ruptured preoperatively. Intraoperatively the appendix, bowel, nodes and upper abdomen were unremarkable. She was DC on post op day 2 on lovenox, which she continues. 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 The primary tumor was 25 cm, no LVSI, 0/16 nodes involved appendix benign and pelvic washings positive (pT1c pN0; IC3). Case was presented at [facility] multidisciplinary conference, with consideration of FOLFOX vs carbo taxol; final recommendation was for [place] taxol. She had first carboplatin taxol on 08-31-14    Review of systems as above, also: Some discoloration without pain or cords above IV site right forearm; that IV obtained on first attempt and not uncomfortable during chemo. No bleeding. Did not sleep last pm, anxious about CA 125. Hot flashes have not been as severe Remainder of 10 point Review of Systems negative.  Objective:  Vital signs in last 24 hours:  BP 144/95 mmHg  Pulse 99  Temp(Src) 98 F (36.7 C) (Oral)  Resp 18  Ht 5\' 2"  (1.575 m)  Wt 181 lb 8 oz (82.328 kg)  BMI 33.19 kg/m2  SpO2 99% Weight stable Alert, oriented and appropriate. Ambulatory without difficulty. Looks comfortable, respirations not labored RA. Very pleasant as always. Alopecia  HEENT:PERRL, sclerae not icteric. Oral mucosa moist with probable slight thrush on tongue only , posterior pharynx clear.  Neck supple. No JVD.  Lymphatics:no cervical,supraclavicular, or  inguinal adenopathy Resp: clear to auscultation bilaterally and normal percussion bilaterally Cardio: regular rate and rhythm. No gallop. GI: abdomen soft, nontender, not distended, no mass or  organomegaly. Normally active bowel sounds. Surgical incision not remarkable. Musculoskeletal/ Extremities: without pitting edema, cords, tenderness Neuro: no peripheral neuropathy. Otherwise nonfocal Skin slight hyperpigmentation in venous path superior right forearm, no cords or tenderness, no heat, does not appear to be infection. Otherwise without rash, ecchymosis, petechiae   Lab Results:  Results for orders placed or performed in visit on 10/18/14  CBC with Differential  Result Value Ref Range   WBC 2.1 (L) 3.9 - 10.3 10e3/uL   NEUT# 1.0 (L) 1.5 - 6.5 10e3/uL   HGB 11.8 11.6 - 15.9 g/dL   HCT 35.5 34.8 - 46.6 %   Platelets 234 145 - 400 10e3/uL   MCV 91.7 79.5 - 101.0 fL   MCH 30.5 25.1 - 34.0 pg   MCHC 33.3 31.5 - 36.0 g/dL   RBC 3.88 3.70 - 5.45 10e6/uL   RDW 14.6 (H) 11.2 - 14.5 %   lymph# 0.8 (L) 0.9 - 3.3 10e3/uL   MONO# 0.0 (L) 0.1 - 0.9 10e3/uL   Eosinophils Absolute 0.2 0.0 - 0.5 10e3/uL   Basophils Absolute 0.0 0.0 - 0.1 10e3/uL   NEUT% 46.2 38.4 - 76.8 %   LYMPH% 39.1 14.0 - 49.7 %   MONO% 1.4 0.0 - 14.0 %   EOS% 11.7 (H) 0.0 - 7.0 %   BASO% 1.6 0.0 - 2.0 %   BMET today WNL  CA 125 from 10-01-14  15, this having been 68 preop and 65 on 09-06-14.  Studies/Results:  No results found.  Medications: I have reviewed the patient's current medications.Granix 300 today, 4-19 and 4-20. Add diflucan X ~ 3 days now and around next chemo, with thrush likely due to steroids.  DISCUSSION: Neutropenic precautions next few days. Will repeat CBC on 4-20, as she will need granix also on 4-21 if ANC < 1.2 that day.  Medications as above. Gatorade/ juices as noted.  Good improvement in CA 125 reviewed. She has not heard re mammogram scholarship, tho likely we will wait until chemo completed to get these.  Assessment/Plan:  1.IC3 mucinous adenocarcinoma of left ovary: preoperative rupture, surgery by gyn oncology at [facility] 08-11-14 (R0), and adjuvant carbo taxol begun 08-31-14. Just  neutropenic today, beginning granix; recheck CBC on 4-20 with parameters for additional granix above. She will have cycle 4 on 11-02-14 as long as ANC >=1.5 and plt >=100k; I will see her back during gCSF following that treatment. 2.surgically induced menopause 3.overdue mammograms, finding consistent with known cyst left breast. Will try to get mammogram scholarship if no insurance coverage by completion of chemo. 4.no colonoscopy 5.BTL and endometrial ablation 6.medication intolerances as noted 7.no flu vaccine 8.no advance directives, information given. 9.oral candida likely related to steroids around taxol. Add diflucan as above. 10. 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 All questions answered and she knows to call prior to next scheduled visit if needed. Time spent 25 min including >50% counseling and coordination of care.  Naethan Bracewell P, MD   10/18/2014, 9:07 AM

## 2014-10-19 ENCOUNTER — Ambulatory Visit (HOSPITAL_BASED_OUTPATIENT_CLINIC_OR_DEPARTMENT_OTHER): Payer: Self-pay

## 2014-10-19 VITALS — BP 146/97 | HR 99 | Temp 98.8°F

## 2014-10-19 DIAGNOSIS — Z5189 Encounter for other specified aftercare: Secondary | ICD-10-CM

## 2014-10-19 DIAGNOSIS — C562 Malignant neoplasm of left ovary: Secondary | ICD-10-CM

## 2014-10-19 MED ORDER — TBO-FILGRASTIM 300 MCG/0.5ML ~~LOC~~ SOSY
300.0000 ug | PREFILLED_SYRINGE | Freq: Once | SUBCUTANEOUS | Status: AC
  Administered 2014-10-19: 300 ug via SUBCUTANEOUS
  Filled 2014-10-19: qty 0.5

## 2014-10-20 ENCOUNTER — Other Ambulatory Visit (HOSPITAL_BASED_OUTPATIENT_CLINIC_OR_DEPARTMENT_OTHER): Payer: Self-pay

## 2014-10-20 ENCOUNTER — Ambulatory Visit (HOSPITAL_BASED_OUTPATIENT_CLINIC_OR_DEPARTMENT_OTHER): Payer: Self-pay

## 2014-10-20 ENCOUNTER — Other Ambulatory Visit: Payer: Self-pay | Admitting: Oncology

## 2014-10-20 VITALS — BP 149/97 | HR 100 | Temp 99.2°F

## 2014-10-20 DIAGNOSIS — Z5189 Encounter for other specified aftercare: Secondary | ICD-10-CM

## 2014-10-20 DIAGNOSIS — C562 Malignant neoplasm of left ovary: Secondary | ICD-10-CM

## 2014-10-20 DIAGNOSIS — B370 Candidal stomatitis: Secondary | ICD-10-CM

## 2014-10-20 LAB — CBC WITH DIFFERENTIAL/PLATELET
BASO%: 1.2 % (ref 0.0–2.0)
BASOS ABS: 0 10*3/uL (ref 0.0–0.1)
EOS%: 8 % — AB (ref 0.0–7.0)
Eosinophils Absolute: 0.2 10*3/uL (ref 0.0–0.5)
HCT: 34.2 % — ABNORMAL LOW (ref 34.8–46.6)
HGB: 11.5 g/dL — ABNORMAL LOW (ref 11.6–15.9)
LYMPH#: 0.9 10*3/uL (ref 0.9–3.3)
LYMPH%: 37.8 % (ref 14.0–49.7)
MCH: 31 pg (ref 25.1–34.0)
MCHC: 33.6 g/dL (ref 31.5–36.0)
MCV: 92.2 fL (ref 79.5–101.0)
MONO#: 0.2 10*3/uL (ref 0.1–0.9)
MONO%: 8.4 % (ref 0.0–14.0)
NEUT#: 1.1 10*3/uL — ABNORMAL LOW (ref 1.5–6.5)
NEUT%: 44.6 % (ref 38.4–76.8)
Platelets: 219 10*3/uL (ref 145–400)
RBC: 3.71 10*6/uL (ref 3.70–5.45)
RDW: 14.1 % (ref 11.2–14.5)
WBC: 2.5 10*3/uL — ABNORMAL LOW (ref 3.9–10.3)

## 2014-10-20 MED ORDER — FILGRASTIM 300 MCG/0.5ML IJ SOSY
300.0000 ug | PREFILLED_SYRINGE | Freq: Once | INTRAMUSCULAR | Status: AC
  Administered 2014-10-20: 300 ug via SUBCUTANEOUS
  Filled 2014-10-20: qty 0.5

## 2014-10-21 ENCOUNTER — Ambulatory Visit (HOSPITAL_BASED_OUTPATIENT_CLINIC_OR_DEPARTMENT_OTHER): Payer: Self-pay

## 2014-10-21 VITALS — BP 148/93 | HR 107 | Temp 99.2°F

## 2014-10-21 DIAGNOSIS — C562 Malignant neoplasm of left ovary: Secondary | ICD-10-CM

## 2014-10-21 DIAGNOSIS — Z5189 Encounter for other specified aftercare: Secondary | ICD-10-CM

## 2014-10-21 MED ORDER — FILGRASTIM 300 MCG/0.5ML IJ SOSY
300.0000 ug | PREFILLED_SYRINGE | Freq: Once | INTRAMUSCULAR | Status: AC
  Administered 2014-10-21: 300 ug via SUBCUTANEOUS
  Filled 2014-10-21: qty 0.5

## 2014-10-29 ENCOUNTER — Other Ambulatory Visit: Payer: Self-pay

## 2014-10-29 DIAGNOSIS — N838 Other noninflammatory disorders of ovary, fallopian tube and broad ligament: Secondary | ICD-10-CM

## 2014-10-29 MED ORDER — LORAZEPAM 0.5 MG PO TABS: ORAL_TABLET | ORAL | Status: DC

## 2014-11-01 ENCOUNTER — Telehealth: Payer: Self-pay

## 2014-11-01 ENCOUNTER — Other Ambulatory Visit: Payer: Self-pay

## 2014-11-01 ENCOUNTER — Other Ambulatory Visit (HOSPITAL_BASED_OUTPATIENT_CLINIC_OR_DEPARTMENT_OTHER): Payer: Self-pay

## 2014-11-01 ENCOUNTER — Other Ambulatory Visit: Payer: Self-pay | Admitting: Oncology

## 2014-11-01 DIAGNOSIS — C562 Malignant neoplasm of left ovary: Secondary | ICD-10-CM

## 2014-11-01 LAB — CBC WITH DIFFERENTIAL/PLATELET
BASO%: 1.8 % (ref 0.0–2.0)
Basophils Absolute: 0 10*3/uL (ref 0.0–0.1)
EOS ABS: 0 10*3/uL (ref 0.0–0.5)
EOS%: 1.7 % (ref 0.0–7.0)
HCT: 34.2 % — ABNORMAL LOW (ref 34.8–46.6)
HEMOGLOBIN: 11.4 g/dL — AB (ref 11.6–15.9)
LYMPH%: 35.9 % (ref 14.0–49.7)
MCH: 30.6 pg (ref 25.1–34.0)
MCHC: 33.3 g/dL (ref 31.5–36.0)
MCV: 91.8 fL (ref 79.5–101.0)
MONO#: 0.2 10*3/uL (ref 0.1–0.9)
MONO%: 6.8 % (ref 0.0–14.0)
NEUT#: 1.5 10*3/uL (ref 1.5–6.5)
NEUT%: 53.8 % (ref 38.4–76.8)
PLATELETS: 236 10*3/uL (ref 145–400)
RBC: 3.73 10*6/uL (ref 3.70–5.45)
RDW: 16.6 % — AB (ref 11.2–14.5)
WBC: 2.8 10*3/uL — ABNORMAL LOW (ref 3.9–10.3)
lymph#: 1 10*3/uL (ref 0.9–3.3)

## 2014-11-01 LAB — COMPREHENSIVE METABOLIC PANEL (CC13)
ALT: 19 U/L (ref 0–55)
ANION GAP: 10 meq/L (ref 3–11)
AST: 19 U/L (ref 5–34)
Albumin: 3.8 g/dL (ref 3.5–5.0)
Alkaline Phosphatase: 69 U/L (ref 40–150)
BUN: 15.5 mg/dL (ref 7.0–26.0)
CO2: 26 meq/L (ref 22–29)
Calcium: 9.4 mg/dL (ref 8.4–10.4)
Chloride: 104 mEq/L (ref 98–109)
Creatinine: 0.8 mg/dL (ref 0.6–1.1)
EGFR: 80 mL/min/{1.73_m2} — ABNORMAL LOW (ref >90)
GLUCOSE: 119 mg/dL (ref 70–140)
Potassium: 4.2 mEq/L (ref 3.5–5.1)
Sodium: 140 mEq/L (ref 136–145)
TOTAL PROTEIN: 7.1 g/dL (ref 6.4–8.3)
Total Bilirubin: 0.28 mg/dL (ref 0.20–1.20)

## 2014-11-01 NOTE — Telephone Encounter (Signed)
Told Alyssa [name] that her [place] today is 1.5.  Dr. Marko Plume said it is fine for her treatment tomorrow 11-02-14,however, she will need 4 days of granix insteat of three,  Added inection for Saturday 11-06-14 as requested by Dr. Marko Plume.  Pt. Aware of appointments and verbalizes understanding. Order entered for [employer] on 11-06-14.

## 2014-11-02 ENCOUNTER — Ambulatory Visit (HOSPITAL_BASED_OUTPATIENT_CLINIC_OR_DEPARTMENT_OTHER): Payer: Self-pay

## 2014-11-02 ENCOUNTER — Other Ambulatory Visit: Payer: Self-pay

## 2014-11-02 VITALS — BP 140/91 | HR 100 | Temp 98.3°F | Resp 18

## 2014-11-02 DIAGNOSIS — C562 Malignant neoplasm of left ovary: Secondary | ICD-10-CM

## 2014-11-02 DIAGNOSIS — Z5111 Encounter for antineoplastic chemotherapy: Secondary | ICD-10-CM

## 2014-11-02 MED ORDER — DIPHENHYDRAMINE HCL 50 MG/ML IJ SOLN
INTRAMUSCULAR | Status: AC
  Filled 2014-11-02: qty 1

## 2014-11-02 MED ORDER — SODIUM CHLORIDE 0.9 % IV SOLN
Freq: Once | INTRAVENOUS | Status: AC
  Administered 2014-11-02: 11:00:00 via INTRAVENOUS
  Filled 2014-11-02: qty 8

## 2014-11-02 MED ORDER — SODIUM CHLORIDE 0.9 % IV SOLN
Freq: Once | INTRAVENOUS | Status: AC
  Administered 2014-11-02: 10:00:00 via INTRAVENOUS

## 2014-11-02 MED ORDER — FAMOTIDINE IN NACL 20-0.9 MG/50ML-% IV SOLN
20.0000 mg | Freq: Once | INTRAVENOUS | Status: AC
  Administered 2014-11-02: 20 mg via INTRAVENOUS

## 2014-11-02 MED ORDER — DIPHENHYDRAMINE HCL 50 MG/ML IJ SOLN
50.0000 mg | Freq: Once | INTRAMUSCULAR | Status: AC
  Administered 2014-11-02: 50 mg via INTRAVENOUS

## 2014-11-02 MED ORDER — SODIUM CHLORIDE 0.9 % IV SOLN
650.0000 mg | Freq: Once | INTRAVENOUS | Status: AC
  Administered 2014-11-02: 650 mg via INTRAVENOUS
  Filled 2014-11-02: qty 65

## 2014-11-02 MED ORDER — PACLITAXEL CHEMO INJECTION 300 MG/50ML
175.0000 mg/m2 | Freq: Once | INTRAVENOUS | Status: AC
  Administered 2014-11-02: 330 mg via INTRAVENOUS
  Filled 2014-11-02: qty 55

## 2014-11-02 MED ORDER — FAMOTIDINE IN NACL 20-0.9 MG/50ML-% IV SOLN
INTRAVENOUS | Status: AC
  Filled 2014-11-02: qty 50

## 2014-11-02 NOTE — Progress Notes (Signed)
Okay to treat with ANC of 1.5 per telephone note on 5/2 by Barbaraann Share, RN

## 2014-11-02 NOTE — Patient Instructions (Signed)
[facility] Discharge Instructions for Patients Receiving Chemotherapy  Today you received the following chemotherapy agents Taxol and Carboplatin. To help prevent nausea and vomiting after your treatment, we encourage you to take your nausea medication as directed.  If you develop nausea and vomiting that is not controlled by your nausea medication, call the clinic.   BELOW ARE SYMPTOMS THAT SHOULD BE REPORTED IMMEDIATELY:  *FEVER GREATER THAN 100.5 F  *CHILLS WITH OR WITHOUT FEVER  NAUSEA AND VOMITING THAT IS NOT CONTROLLED WITH YOUR NAUSEA MEDICATION  *UNUSUAL SHORTNESS OF BREATH  *UNUSUAL BRUISING OR BLEEDING  TENDERNESS IN MOUTH AND THROAT WITH OR WITHOUT PRESENCE OF ULCERS  *URINARY PROBLEMS  *BOWEL PROBLEMS  UNUSUAL RASH Items with * indicate a potential emergency and should be followed up as soon as possible.  Feel free to call the clinic you have any questions or concerns. The clinic phone number is [phone].  Please show the CHEMO ALERT CARD at check-in to the Emergency Department and triage nurse.    

## 2014-11-06 ENCOUNTER — Ambulatory Visit (HOSPITAL_BASED_OUTPATIENT_CLINIC_OR_DEPARTMENT_OTHER): Payer: Self-pay

## 2014-11-06 VITALS — BP 125/98 | HR 115 | Temp 98.7°F | Resp 18

## 2014-11-06 DIAGNOSIS — Z5189 Encounter for other specified aftercare: Secondary | ICD-10-CM

## 2014-11-06 DIAGNOSIS — C562 Malignant neoplasm of left ovary: Secondary | ICD-10-CM

## 2014-11-06 MED ORDER — FILGRASTIM 300 MCG/0.5ML IJ SOSY
300.0000 ug | PREFILLED_SYRINGE | Freq: Once | INTRAMUSCULAR | Status: AC
  Administered 2014-11-06: 300 ug via SUBCUTANEOUS

## 2014-11-07 ENCOUNTER — Other Ambulatory Visit: Payer: Self-pay | Admitting: Oncology

## 2014-11-08 ENCOUNTER — Telehealth: Payer: Self-pay | Admitting: Oncology

## 2014-11-08 ENCOUNTER — Ambulatory Visit (HOSPITAL_BASED_OUTPATIENT_CLINIC_OR_DEPARTMENT_OTHER): Payer: Self-pay

## 2014-11-08 VITALS — BP 148/88 | HR 87 | Temp 98.5°F

## 2014-11-08 DIAGNOSIS — C562 Malignant neoplasm of left ovary: Secondary | ICD-10-CM

## 2014-11-08 DIAGNOSIS — Z5189 Encounter for other specified aftercare: Secondary | ICD-10-CM

## 2014-11-08 MED ORDER — FILGRASTIM 300 MCG/0.5ML IJ SOSY
300.0000 ug | PREFILLED_SYRINGE | Freq: Once | INTRAMUSCULAR | Status: AC
  Administered 2014-11-08: 300 ug via SUBCUTANEOUS
  Filled 2014-11-08: qty 0.5

## 2014-11-08 NOTE — Telephone Encounter (Signed)
Chemo added and patient will get a new schedule on 5/10

## 2014-11-09 ENCOUNTER — Ambulatory Visit (HOSPITAL_BASED_OUTPATIENT_CLINIC_OR_DEPARTMENT_OTHER): Payer: Self-pay | Admitting: Oncology

## 2014-11-09 ENCOUNTER — Other Ambulatory Visit (HOSPITAL_BASED_OUTPATIENT_CLINIC_OR_DEPARTMENT_OTHER): Payer: Self-pay

## 2014-11-09 ENCOUNTER — Telehealth: Payer: Self-pay | Admitting: Oncology

## 2014-11-09 ENCOUNTER — Ambulatory Visit (HOSPITAL_BASED_OUTPATIENT_CLINIC_OR_DEPARTMENT_OTHER): Payer: Self-pay

## 2014-11-09 ENCOUNTER — Encounter: Payer: Self-pay | Admitting: Oncology

## 2014-11-09 VITALS — BP 140/95 | HR 93 | Temp 97.1°F | Resp 18 | Ht 62.0 in | Wt 184.6 lb

## 2014-11-09 DIAGNOSIS — K5900 Constipation, unspecified: Secondary | ICD-10-CM

## 2014-11-09 DIAGNOSIS — B370 Candidal stomatitis: Secondary | ICD-10-CM

## 2014-11-09 DIAGNOSIS — G620 Drug-induced polyneuropathy: Secondary | ICD-10-CM

## 2014-11-09 DIAGNOSIS — C562 Malignant neoplasm of left ovary: Secondary | ICD-10-CM

## 2014-11-09 DIAGNOSIS — I10 Essential (primary) hypertension: Secondary | ICD-10-CM

## 2014-11-09 DIAGNOSIS — N838 Other noninflammatory disorders of ovary, fallopian tube and broad ligament: Secondary | ICD-10-CM

## 2014-11-09 DIAGNOSIS — Z5189 Encounter for other specified aftercare: Secondary | ICD-10-CM

## 2014-11-09 DIAGNOSIS — T451X5A Adverse effect of antineoplastic and immunosuppressive drugs, initial encounter: Secondary | ICD-10-CM

## 2014-11-09 DIAGNOSIS — R030 Elevated blood-pressure reading, without diagnosis of hypertension: Secondary | ICD-10-CM

## 2014-11-09 DIAGNOSIS — D701 Agranulocytosis secondary to cancer chemotherapy: Secondary | ICD-10-CM

## 2014-11-09 DIAGNOSIS — G622 Polyneuropathy due to other toxic agents: Secondary | ICD-10-CM

## 2014-11-09 LAB — COMPREHENSIVE METABOLIC PANEL (CC13)
ALBUMIN: 3.9 g/dL (ref 3.5–5.0)
ALK PHOS: 76 U/L (ref 40–150)
ALT: 28 U/L (ref 0–55)
AST: 19 U/L (ref 5–34)
Anion Gap: 13 mEq/L — ABNORMAL HIGH (ref 3–11)
BUN: 13.3 mg/dL (ref 7.0–26.0)
CALCIUM: 9.6 mg/dL (ref 8.4–10.4)
CHLORIDE: 102 meq/L (ref 98–109)
CO2: 26 mEq/L (ref 22–29)
Creatinine: 1 mg/dL (ref 0.6–1.1)
EGFR: 69 mL/min/{1.73_m2} — AB (ref >90)
Glucose: 101 mg/dl (ref 70–140)
Potassium: 4.2 mEq/L (ref 3.5–5.1)
SODIUM: 140 meq/L (ref 136–145)
TOTAL PROTEIN: 7.2 g/dL (ref 6.4–8.3)
Total Bilirubin: 0.31 mg/dL (ref 0.20–1.20)

## 2014-11-09 LAB — CBC WITH DIFFERENTIAL/PLATELET
BASO%: 1.3 % (ref 0.0–2.0)
Basophils Absolute: 0 10*3/uL (ref 0.0–0.1)
EOS%: 4.6 % (ref 0.0–7.0)
Eosinophils Absolute: 0.2 10*3/uL (ref 0.0–0.5)
HEMATOCRIT: 33.2 % — AB (ref 34.8–46.6)
HGB: 11.3 g/dL — ABNORMAL LOW (ref 11.6–15.9)
LYMPH#: 0.8 10*3/uL — AB (ref 0.9–3.3)
LYMPH%: 24 % (ref 14.0–49.7)
MCH: 31.4 pg (ref 25.1–34.0)
MCHC: 33.8 g/dL (ref 31.5–36.0)
MCV: 92.8 fL (ref 79.5–101.0)
MONO#: 0.2 10*3/uL (ref 0.1–0.9)
MONO%: 5.1 % (ref 0.0–14.0)
NEUT#: 2.2 10*3/uL (ref 1.5–6.5)
NEUT%: 65 % (ref 38.4–76.8)
Platelets: 243 10*3/uL (ref 145–400)
RBC: 3.58 10*6/uL — AB (ref 3.70–5.45)
RDW: 16.4 % — AB (ref 11.2–14.5)
WBC: 3.4 10*3/uL — AB (ref 3.9–10.3)

## 2014-11-09 MED ORDER — FILGRASTIM 300 MCG/0.5ML IJ SOSY
300.0000 ug | PREFILLED_SYRINGE | Freq: Once | INTRAMUSCULAR | Status: AC
  Administered 2014-11-09: 300 ug via SUBCUTANEOUS
  Filled 2014-11-09: qty 0.5

## 2014-11-09 MED ORDER — DEXAMETHASONE 4 MG PO TABS: ORAL_TABLET | ORAL | Status: DC

## 2014-11-09 MED ORDER — TRIAMTERENE-HCTZ 37.5-25 MG PO TABS: 1.0000 | ORAL_TABLET | Freq: Every day | ORAL | Status: DC

## 2014-11-09 NOTE — Telephone Encounter (Signed)
Appointments made and avs printed for patient °

## 2014-11-09 NOTE — Progress Notes (Signed)
OFFICE PROGRESS NOTE   Nov 09, 2014   Physicians:Emma Rossi/ [place] Bae-Jump,C.Gaccione  INTERVAL HISTORY:  Patient is seen, together with daughter (and 52 yo granddaughter Addison) in continuing attention to [place] mucinous adenocarcinoma of left ovary, having had cycle 4 carboplatin taxol on 11-02-14, with #3 gCSF due today.  Other than constipation, she tolerated cycle 3 better than priors, particularly with less aching. Bowels did not move from 5-7 until this AM despite colace and prunes; she is willing to begin miralax, which she probably needs to use daily. She did not have significant nausea or vomiting. Peripheral IV access in arms is more difficult, with ~ 3 attempts for most recent treatment, however veins in hands look good and, per patient, hands have not been used. She would prefer not to have PICC if able to manage last 2 treatments peripherally. Slight peripheral neuropathy in feet not bothersome. Blood pressures consistently elevated, frequently >140/90 at this office. She does not have PCP and has never been treated for HTN, but is willing for [place] to start antihypertensive and is willing to establish with PCP. She will get names for me by next appointment, for referral.  No central catheter No genetics testing No flu vaccine  Newest grandchild 75 weeks old. Short term disability thru end of June.  ONCOLOGIC HISTORY  Patient had been in usual good health with entirely regular menstrual periods until indigestion and difficulty eating ~ late Nov 2015, then abdominal fullness and distension by late Dec. The abdominal swelling progressed such that she was seen by PCP at [facility], then ED at [facility] on 07-28-14. CT was obtained at [facility], reportedly with 26 x 12 cm cystic mass with enhancing septations and peripheral nodular soft tissue component, apparently arising from left ovary and occupying entire mid pelvis with deviation of otherwise normal appearing uterus; no  ascites, omental cake, peritoneal carcinomatosis or adenopathy per CT. She was seen by Dr Armandina Stammer, with CA 125 68 and CEA normal. She was seen in consultation by Dr Denman George on 08-05-14 and, due to surgical availability, sent to Dr Suzan Slick at [facility]. Surgery on 08-11-14 was exploratory laparotomy with TAH, BSO and lymphadenectomy, R0 at completion although the ovary had ruptured preoperatively. Intraoperatively the appendix, bowel, nodes and upper abdomen were unremarkable. She was DC on post op day 2 on lovenox, which she continues. 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 The primary tumor was 25 cm, no LVSI, 0/16 nodes involved appendix benign and pelvic washings positive (pT1c pN0; IC3). Case was presented at [facility] multidisciplinary conference, with consideration of FOLFOX vs carbo taxol; final recommendation was for [place] taxol. She had first carboplatin taxol on 08-31-14    Review of systems as above, also: No fever or symptoms of infection. Bladder ok. No SOB. No bleeding. Bruising but no other problems at sites of attempted IV access. Does not check BP at home. Remainder of 10 point Review of Systems negative.  Objective:  Vital signs in last 24 hours: 184 lb 9 oz , up 3 lbs. 140/95, HR 93 regular, resp 18 not labored RA, 97.1  Alert, oriented and appropriate. Ambulatory without difficulty.  Alopecia  HEENT:PERRL, sclerae not icteric. Oral mucosa moist without lesions, posterior pharynx clear.  Neck supple. No JVD.  Lymphatics:no cervical,supraclavicular or inguinal adenopathy Resp: clear to auscultation bilaterally and normal percussion bilaterally Cardio: regular  rate and rhythm. No gallop. GI: soft, nontender, not distended, no mass or organomegaly. Normally active bowel sounds. Surgical incision not  remarkable. Musculoskeletal/ Extremities: without pitting edema, cords, tenderness. Veins in hands bilaterally look adequate for IVs  Neuro: no significant peripheral neuropathy. Otherwise nonfocal. PSYCH appropriate mood and affect Skin without rash, petechiae. Resolving ecchymosis left forearm at site of IV attempt.   Lab Results:  Results for orders placed or performed in visit on 11/09/14  CBC with Differential  Result Value Ref Range   WBC 3.4 (L) 3.9 - 10.3 10e3/uL   NEUT# 2.2 1.5 - 6.5 10e3/uL   HGB 11.3 (L) 11.6 - 15.9 g/dL   HCT 33.2 (L) 34.8 - 46.6 %   Platelets 243 145 - 400 10e3/uL   MCV 92.8 79.5 - 101.0 fL   MCH 31.4 25.1 - 34.0 pg   MCHC 33.8 31.5 - 36.0 g/dL   RBC 3.58 (L) 3.70 - 5.45 10e6/uL   RDW 16.4 (H) 11.2 - 14.5 %   lymph# 0.8 (L) 0.9 - 3.3 10e3/uL   MONO# 0.2 0.1 - 0.9 10e3/uL   Eosinophils Absolute 0.2 0.0 - 0.5 10e3/uL   Basophils Absolute 0.0 0.0 - 0.1 10e3/uL   NEUT% 65.0 38.4 - 76.8 %   LYMPH% 24.0 14.0 - 49.7 %   MONO% 5.1 0.0 - 14.0 %   EOS% 4.6 0.0 - 7.0 %   BASO% 1.3 0.0 - 2.0 %    CMET available after visit normal  Studies/Results:  No results found.  Medications: I have reviewed the patient's current medications. Add miralax to keep bowels moving well daily. Add maxzide 25 q AM and follow BP.  DISCUSSION: Constipation issues as above.  Medications as above MD spoke directly with outreach office, obtained form for mammogram scholarship which she will return to RN when back at office for gCSF (to return to [place]).  Needs referral to PCP, which we will do when she brings names of physicians.  Assessment/Plan:  1.IC3 mucinous adenocarcinoma of left ovary: preoperative rupture, surgery by gyn oncology at [facility] 08-11-14 (R0), and adjuvant carbo taxol begun 3-1-1: Granix today and 5-11 for total 4 injections. I will see her back with labs and to follow up antihypertensive on 11-19-14 and she will have cycle 5 on 11-23-14 as long as [place] >=1.5 and  plt >=100k, granix x 4 afterwards. 2.surgically induced menopause 3.overdue mammograms, finding consistent with known cyst left breast. Mammogram scholarship application given to patient now. No colonoscopy, which we will address after chemo completes if possible 4.elevated BPs : will begin low dose maxzide 25. Needs referral to PCP to manage. 5.BTL and endometrial ablation 6.medication intolerances as noted 7.no flu vaccine 8.no advance directives, information given. 9.oral candida likely related to steroids around taxol. Add diflucan as above. 10. 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 Granix orders confirmed. Chemo cycles 5 and 6 added, notification to financial staff. Time spent 25 min including >50% counseling and coordination of care.   LIVESAY,LENNIS P, MD   11/09/2014, 10:01 AM

## 2014-11-10 ENCOUNTER — Ambulatory Visit (HOSPITAL_BASED_OUTPATIENT_CLINIC_OR_DEPARTMENT_OTHER): Payer: Self-pay

## 2014-11-10 VITALS — BP 136/97 | HR 105 | Temp 98.7°F

## 2014-11-10 DIAGNOSIS — C562 Malignant neoplasm of left ovary: Secondary | ICD-10-CM

## 2014-11-10 DIAGNOSIS — Z5189 Encounter for other specified aftercare: Secondary | ICD-10-CM

## 2014-11-10 MED ORDER — FILGRASTIM 300 MCG/0.5ML IJ SOSY
300.0000 ug | PREFILLED_SYRINGE | Freq: Once | INTRAMUSCULAR | Status: AC
  Administered 2014-11-10: 300 ug via SUBCUTANEOUS
  Filled 2014-11-10: qty 0.5

## 2014-11-11 DIAGNOSIS — I10 Essential (primary) hypertension: Secondary | ICD-10-CM | POA: Insufficient documentation

## 2014-11-17 ENCOUNTER — Other Ambulatory Visit: Payer: Self-pay | Admitting: Oncology

## 2014-11-17 DIAGNOSIS — C562 Malignant neoplasm of left ovary: Secondary | ICD-10-CM

## 2014-11-19 ENCOUNTER — Encounter: Payer: Self-pay | Admitting: Oncology

## 2014-11-19 ENCOUNTER — Other Ambulatory Visit: Payer: Self-pay | Admitting: *Deleted

## 2014-11-19 ENCOUNTER — Other Ambulatory Visit (HOSPITAL_BASED_OUTPATIENT_CLINIC_OR_DEPARTMENT_OTHER): Payer: Self-pay

## 2014-11-19 ENCOUNTER — Ambulatory Visit (HOSPITAL_BASED_OUTPATIENT_CLINIC_OR_DEPARTMENT_OTHER): Payer: Self-pay | Admitting: Oncology

## 2014-11-19 VITALS — BP 127/92 | HR 98 | Temp 98.0°F | Resp 18 | Ht 62.0 in | Wt 181.5 lb

## 2014-11-19 DIAGNOSIS — G622 Polyneuropathy due to other toxic agents: Secondary | ICD-10-CM

## 2014-11-19 DIAGNOSIS — C562 Malignant neoplasm of left ovary: Secondary | ICD-10-CM

## 2014-11-19 DIAGNOSIS — N838 Other noninflammatory disorders of ovary, fallopian tube and broad ligament: Secondary | ICD-10-CM

## 2014-11-19 DIAGNOSIS — D701 Agranulocytosis secondary to cancer chemotherapy: Secondary | ICD-10-CM

## 2014-11-19 DIAGNOSIS — K5900 Constipation, unspecified: Secondary | ICD-10-CM

## 2014-11-19 DIAGNOSIS — I10 Essential (primary) hypertension: Secondary | ICD-10-CM

## 2014-11-19 DIAGNOSIS — T451X5A Adverse effect of antineoplastic and immunosuppressive drugs, initial encounter: Secondary | ICD-10-CM

## 2014-11-19 DIAGNOSIS — E876 Hypokalemia: Secondary | ICD-10-CM

## 2014-11-19 LAB — COMPREHENSIVE METABOLIC PANEL (CC13)
ALK PHOS: 83 U/L (ref 40–150)
ALT: 21 U/L (ref 0–55)
AST: 23 U/L (ref 5–34)
Albumin: 3.9 g/dL (ref 3.5–5.0)
Anion Gap: 16 mEq/L — ABNORMAL HIGH (ref 3–11)
BUN: 23.2 mg/dL (ref 7.0–26.0)
CALCIUM: 9.5 mg/dL (ref 8.4–10.4)
CO2: 32 mEq/L — ABNORMAL HIGH (ref 22–29)
CREATININE: 1 mg/dL (ref 0.6–1.1)
Chloride: 93 mEq/L — ABNORMAL LOW (ref 98–109)
EGFR: 62 mL/min/{1.73_m2} — AB (ref >90)
Glucose: 106 mg/dl (ref 70–140)
Potassium: 2.5 mEq/L — CL (ref 3.5–5.1)
Sodium: 141 mEq/L (ref 136–145)
Total Bilirubin: 0.5 mg/dL (ref 0.20–1.20)
Total Protein: 7.7 g/dL (ref 6.4–8.3)

## 2014-11-19 LAB — CBC WITH DIFFERENTIAL/PLATELET
BASO%: 1.2 % (ref 0.0–2.0)
BASOS ABS: 0 10*3/uL (ref 0.0–0.1)
EOS%: 2 % (ref 0.0–7.0)
Eosinophils Absolute: 0.1 10*3/uL (ref 0.0–0.5)
HEMATOCRIT: 38 % (ref 34.8–46.6)
HGB: 13.2 g/dL (ref 11.6–15.9)
LYMPH%: 32.1 % (ref 14.0–49.7)
MCH: 31.7 pg (ref 25.1–34.0)
MCHC: 34.7 g/dL (ref 31.5–36.0)
MCV: 91.3 fL (ref 79.5–101.0)
MONO#: 0.3 10*3/uL (ref 0.1–0.9)
MONO%: 8.1 % (ref 0.0–14.0)
NEUT#: 1.8 10*3/uL (ref 1.5–6.5)
NEUT%: 56.6 % (ref 38.4–76.8)
Platelets: 214 10*3/uL (ref 145–400)
RBC: 4.16 10*6/uL (ref 3.70–5.45)
RDW: 16.7 % — AB (ref 11.2–14.5)
WBC: 3.1 10*3/uL — ABNORMAL LOW (ref 3.9–10.3)
lymph#: 1 10*3/uL (ref 0.9–3.3)

## 2014-11-19 LAB — CA 125: CA 125: 14 U/mL (ref <35)

## 2014-11-19 MED ORDER — POTASSIUM CHLORIDE CRYS ER 20 MEQ PO TBCR: EXTENDED_RELEASE_TABLET | ORAL | Status: DC

## 2014-11-19 MED ORDER — LORAZEPAM 0.5 MG PO TABS: ORAL_TABLET | ORAL | Status: DC

## 2014-11-19 NOTE — Progress Notes (Signed)
OFFICE PROGRESS NOTE   Nov 19, 2014   Physicians:Emma Rossi/ [place] Bae-Jump,C.Gaccione  INTERVAL HISTORY:  Patient is seen, together with young granddaughter, in continuing attention to adjuvant chemotherapy in process for IC3 mucinous adenocarcinoa of left ovary. She is due cycle 5 carboplatin taxol on 11-23-14, and has needed 4 days of granix following treatments.  Patient did better overall with most recent treatment, tho had nausea for ~ 6 days, fatigue for ~ 7 days and 4 days of constipation. Constipation resolved after miralax x 2 days, tho smell of the miralax was not tolerable for her. She had no significant aches using claritin with granix.  She has been on maxzide-25 since 11-09-14, has not checked BP at home.    No PAC No flu vaccine No genetics evaluation    ONCOLOGIC HISTORY Patient had been in usual good health with entirely regular menstrual periods until indigestion and difficulty eating ~ late Nov 2015, then abdominal fullness and distension by late Dec. The abdominal swelling progressed such that she was seen by PCP at [facility], then ED at [facility] on 07-28-14. CT was obtained at [facility], reportedly with 26 x 12 cm cystic mass with enhancing septations and peripheral nodular soft tissue component, apparently arising from left ovary and occupying entire mid pelvis with deviation of otherwise normal appearing uterus; no ascites, omental cake, peritoneal carcinomatosis or adenopathy per CT. She was seen by Dr Armandina Stammer, with CA 125 68 and CEA normal. She was seen in consultation by Dr Denman George on 08-05-14 and, due to surgical availability, sent to Dr Suzan Slick at [facility]. Surgery on 08-11-14 was exploratory laparotomy with TAH, BSO and lymphadenectomy, R0 at completion although the ovary had ruptured preoperatively. Intraoperatively the appendix, bowel, nodes and upper abdomen were unremarkable. She was DC on post op day 2 on lovenox, which she continues. 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 The primary tumor was 25 cm, no LVSI, 0/16 nodes involved appendix benign and pelvic washings positive (pT1c pN0; IC3). Case was presented at [facility] multidisciplinary conference, with consideration of FOLFOX vs carbo taxol; final recommendation was for [place] taxol. She had first carboplatin taxol on 08-31-14 and was neutropenic with ANC 0.1 on day 10, gCSF added.     Review of systems as above, also: Smell and taste both bothersome but is making herself eat. Sleeping much better with Vitamin E. Still somewhat fatigued today, no SOB, no pain, no fever or symptoms of infection.  Remainder of 10 point Review of Systems negative.  Objective:  Vital signs in last 24 hours:  BP 127/92 mmHg  Pulse 98  Temp(Src) 98 F (36.7 C) (Oral)  Resp 18  Ht 5\' 2"  (1.575 m)  Wt 181 lb 8 oz (82.328 kg)  BMI 33.19 kg/m2  SpO2 100% Weight down 3 lbs. Alert, oriented and appropriate. Ambulatory without difficulty.  Alopecia  HEENT:PERRL, sclerae not icteric. Oral mucosa moist without lesions, posterior pharynx clear.  Neck supple. No JVD.  Lymphatics:no cervical,supraclavicular or inguinal adenopathy Resp: clear to auscultation bilaterally and normal percussion bilaterally Cardio: regular rate and rhythm. No gallop. GI: soft, nontender, not distended, no mass or organomegaly. Some bowel sounds. Surgical incision not remarkable. Musculoskeletal/ Extremities: without pitting edema, cords, tenderness Neuro: no significant peripheral neuropathy. Otherwise nonfocal. PSYCH appropriate mood and affect. Skin without rash, ecchymosis, petechiae. No problems at sites of  recent IV access.   Lab Results:  Results for orders placed or performed in visit on 11/19/14  CBC with Differential/Platelet  Result Value  Ref Range   WBC 3.1 (L) 3.9 - 10.3 10e3/uL   NEUT# 1.8 1.5 - 6.5 10e3/uL   HGB 13.2 11.6 - 15.9 g/dL   HCT 38.0 34.8 - 46.6 %   Platelets 214 145 - 400 10e3/uL   MCV 91.3 79.5 - 101.0 fL   MCH 31.7 25.1 - 34.0 pg   MCHC 34.7 31.5 - 36.0 g/dL   RBC 4.16 3.70 - 5.45 10e6/uL   RDW 16.7 (H) 11.2 - 14.5 %   lymph# 1.0 0.9 - 3.3 10e3/uL   MONO# 0.3 0.1 - 0.9 10e3/uL   Eosinophils Absolute 0.1 0.0 - 0.5 10e3/uL   Basophils Absolute 0.0 0.0 - 0.1 10e3/uL   NEUT% 56.6 38.4 - 76.8 %   LYMPH% 32.1 14.0 - 49.7 %   MONO% 8.1 0.0 - 14.0 %   EOS% 2.0 0.0 - 7.0 %   BASO% 1.2 0.0 - 2.0 %    CMET available after visit with K 2.5, Cl 93, creat 1.0, LFTs fine  CA 125 available after visit 14  Studies/Results:  No results found.  Medications: I have reviewed the patient's current medications. Patient contacted by phone after chemistries resulted, instructed to hold maxzide-25 and begin KCL 20 mEq x2 today then 1 daily until recheck BMET on 5-24. Suggested senokot S or equivalent 1-2 daily - bid as not tolerating miralax.   DISCUSSION: medications as above.  Assessment/Plan:  1.IC3 mucinous adenocarcinoma of left ovary: preoperative rupture, surgery by gyn oncology at [facility] 08-11-14 (R0), and adjuvant carbo taxol begun 3-1-1. Cycle 5 will be given 11-23-14 as long as ANC >=1.5 and plt >=100k, granix x 4 afterwards. 2.surgically induced menopause 3.hypokalemia: apparently with diuretic despite triamterine. Plan as above 4.elevated BPs : holding maxzide-25 until hypokalemia resolved. Will need to establish with PCP when possible 5.BTL and endometrial ablation 6.constipation after chemo: try senokot S 7.no flu vaccine 8.no advance directives, information given. 9.oral candida likely related to steroids around taxol. Add diflucan as above. 10. 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 Mammogram scholarship  application given to patient now. No colonoscopy, which we will address after chemo completes if possible  Chemo and granix orders confirmed. I will see her again on 12-09-14 and she will be due cycle 6 on 12-14-14. Time spent 25 min including >50% counseling and coordination of care.  Marjarie Irion P, MD   11/19/2014, 8:29 AM

## 2014-11-19 NOTE — Telephone Encounter (Signed)
Pt notified-Stop Maxide. Take KCL 20 meq- 2 tablets today , then 1 tablet daily. Will recheck labs on 5/24. RX sent to [facility]

## 2014-11-23 ENCOUNTER — Other Ambulatory Visit: Payer: Self-pay

## 2014-11-23 ENCOUNTER — Telehealth: Payer: Self-pay

## 2014-11-23 ENCOUNTER — Ambulatory Visit (HOSPITAL_BASED_OUTPATIENT_CLINIC_OR_DEPARTMENT_OTHER): Payer: Self-pay

## 2014-11-23 ENCOUNTER — Other Ambulatory Visit: Payer: Self-pay | Admitting: Oncology

## 2014-11-23 ENCOUNTER — Other Ambulatory Visit (HOSPITAL_BASED_OUTPATIENT_CLINIC_OR_DEPARTMENT_OTHER): Payer: Self-pay

## 2014-11-23 VITALS — BP 144/92 | HR 106 | Temp 97.9°F | Resp 20

## 2014-11-23 DIAGNOSIS — E876 Hypokalemia: Secondary | ICD-10-CM

## 2014-11-23 DIAGNOSIS — C562 Malignant neoplasm of left ovary: Secondary | ICD-10-CM

## 2014-11-23 DIAGNOSIS — Z5111 Encounter for antineoplastic chemotherapy: Secondary | ICD-10-CM

## 2014-11-23 DIAGNOSIS — I10 Essential (primary) hypertension: Secondary | ICD-10-CM

## 2014-11-23 LAB — CBC WITH DIFFERENTIAL/PLATELET
BASO%: 0 % (ref 0.0–2.0)
BASOS ABS: 0 10*3/uL (ref 0.0–0.1)
EOS ABS: 0 10*3/uL (ref 0.0–0.5)
EOS%: 0 % (ref 0.0–7.0)
HCT: 35.8 % (ref 34.8–46.6)
HGB: 12.2 g/dL (ref 11.6–15.9)
LYMPH%: 6.9 % — ABNORMAL LOW (ref 14.0–49.7)
MCH: 31.8 pg (ref 25.1–34.0)
MCHC: 34.1 g/dL (ref 31.5–36.0)
MCV: 93.2 fL (ref 79.5–101.0)
MONO#: 0 10*3/uL — ABNORMAL LOW (ref 0.1–0.9)
MONO%: 0.5 % (ref 0.0–14.0)
NEUT#: 3.9 10*3/uL (ref 1.5–6.5)
NEUT%: 92.6 % — AB (ref 38.4–76.8)
Platelets: 242 10*3/uL (ref 145–400)
RBC: 3.84 10*6/uL (ref 3.70–5.45)
RDW: 15.8 % — AB (ref 11.2–14.5)
WBC: 4.2 10*3/uL (ref 3.9–10.3)
lymph#: 0.3 10*3/uL — ABNORMAL LOW (ref 0.9–3.3)

## 2014-11-23 LAB — BASIC METABOLIC PANEL (CC13)
Anion Gap: 15 mEq/L — ABNORMAL HIGH (ref 3–11)
BUN: 14.9 mg/dL (ref 7.0–26.0)
CALCIUM: 9.6 mg/dL (ref 8.4–10.4)
CO2: 24 meq/L (ref 22–29)
CREATININE: 1 mg/dL (ref 0.6–1.1)
Chloride: 103 mEq/L (ref 98–109)
EGFR: 67 mL/min/{1.73_m2} — ABNORMAL LOW (ref >90)
GLUCOSE: 187 mg/dL — AB (ref 70–140)
POTASSIUM: 3.4 meq/L — AB (ref 3.5–5.1)
SODIUM: 141 meq/L (ref 136–145)

## 2014-11-23 MED ORDER — SODIUM CHLORIDE 0.9 % IV SOLN
650.0000 mg | Freq: Once | INTRAVENOUS | Status: AC
  Administered 2014-11-23: 650 mg via INTRAVENOUS
  Filled 2014-11-23: qty 65

## 2014-11-23 MED ORDER — DIPHENHYDRAMINE HCL 50 MG/ML IJ SOLN
INTRAMUSCULAR | Status: AC
  Filled 2014-11-23: qty 1

## 2014-11-23 MED ORDER — DIPHENHYDRAMINE HCL 50 MG/ML IJ SOLN
50.0000 mg | Freq: Once | INTRAMUSCULAR | Status: AC
  Administered 2014-11-23: 50 mg via INTRAVENOUS

## 2014-11-23 MED ORDER — FAMOTIDINE IN NACL 20-0.9 MG/50ML-% IV SOLN
INTRAVENOUS | Status: AC
  Filled 2014-11-23: qty 50

## 2014-11-23 MED ORDER — DEXAMETHASONE SODIUM PHOSPHATE 100 MG/10ML IJ SOLN
Freq: Once | INTRAMUSCULAR | Status: AC
  Administered 2014-11-23: 09:00:00 via INTRAVENOUS
  Filled 2014-11-23: qty 8

## 2014-11-23 MED ORDER — DEXTROSE 5 % IV SOLN
175.0000 mg/m2 | Freq: Once | INTRAVENOUS | Status: AC
  Administered 2014-11-23: 330 mg via INTRAVENOUS
  Filled 2014-11-23: qty 55

## 2014-11-23 MED ORDER — METOPROLOL SUCCINATE ER 25 MG PO TB24: 25.0000 mg | ORAL_TABLET | Freq: Every day | ORAL | Status: DC

## 2014-11-23 MED ORDER — POTASSIUM CHLORIDE CRYS ER 20 MEQ PO TBCR: EXTENDED_RELEASE_TABLET | ORAL | Status: DC

## 2014-11-23 MED ORDER — SODIUM CHLORIDE 0.9 % IV SOLN
Freq: Once | INTRAVENOUS | Status: AC
  Administered 2014-11-23: 09:00:00 via INTRAVENOUS

## 2014-11-23 MED ORDER — FAMOTIDINE IN NACL 20-0.9 MG/50ML-% IV SOLN
20.0000 mg | Freq: Once | INTRAVENOUS | Status: AC
  Administered 2014-11-23: 20 mg via INTRAVENOUS

## 2014-11-23 NOTE — Telephone Encounter (Signed)
Alyssa [name], Alyssa [name] - 11/23/14 >','<< Less Detail',event)" href="javascript:;">More Detail >>   Gordy Levan, MD   Sent: Tue Nov 23, 2014 2:04 PM    To: Baruch Merl, RN        Message     Continue K 10 mEq daily + increase in diet    Stop Maxzide 25    Start Toprol XL 25 mg daily #30 1 RF    Who does she want [place] to refer to for PCP?        thanks    ----- Message -----     From: Baruch Merl, RN     Sent: 11/23/2014 12:43 PM      To: Gordy Levan, MD        Hi Alyssa Morgans,    [name]'s kcl today was 3.4.    What do you want to do with Maxzide and KCL.? Only ~1 week of KCL sent in.     Thanks Jessicalynn Deshong.

## 2014-11-23 NOTE — Patient Instructions (Signed)
[facility] Discharge Instructions for Patients Receiving Chemotherapy  Today you received the following chemotherapy agents:  Taxol and Carboplatin  To help prevent nausea and vomiting after your treatment, we encourage you to take your nausea medication as ordered per MD.   If you develop nausea and vomiting that is not controlled by your nausea medication, call the clinic.   BELOW ARE SYMPTOMS THAT SHOULD BE REPORTED IMMEDIATELY:  *FEVER GREATER THAN 100.5 F  *CHILLS WITH OR WITHOUT FEVER  NAUSEA AND VOMITING THAT IS NOT CONTROLLED WITH YOUR NAUSEA MEDICATION  *UNUSUAL SHORTNESS OF BREATH  *UNUSUAL BRUISING OR BLEEDING  TENDERNESS IN MOUTH AND THROAT WITH OR WITHOUT PRESENCE OF ULCERS  *URINARY PROBLEMS  *BOWEL PROBLEMS  UNUSUAL RASH Items with * indicate a potential emergency and should be followed up as soon as possible.  Feel free to call the clinic you have any questions or concerns. The clinic phone number is [phone].  Please show the CHEMO ALERT CARD at check-in to the Emergency Department and triage nurse.   

## 2014-11-23 NOTE — Telephone Encounter (Signed)
Told Alyssa [name] to stop the Maxzide 25 mg.  Begin Lopressor XL 20 mg daily Increase KCL in diet through foods such as cantaloupe, OJ, strawberries, and white  Potatoes as noted below by Dr. Marko Plume.  Alyssa [name] stated that she will bring a name and phone number for PCP referral to an  injection appointment nest week.

## 2014-11-27 ENCOUNTER — Ambulatory Visit (HOSPITAL_BASED_OUTPATIENT_CLINIC_OR_DEPARTMENT_OTHER): Payer: Self-pay

## 2014-11-27 VITALS — BP 126/94 | HR 108 | Temp 98.4°F | Resp 18

## 2014-11-27 DIAGNOSIS — C562 Malignant neoplasm of left ovary: Secondary | ICD-10-CM

## 2014-11-27 DIAGNOSIS — Z5189 Encounter for other specified aftercare: Secondary | ICD-10-CM

## 2014-11-27 MED ORDER — FILGRASTIM 300 MCG/0.5ML IJ SOSY
300.0000 ug | PREFILLED_SYRINGE | Freq: Once | INTRAMUSCULAR | Status: AC
  Administered 2014-11-27: 300 ug via SUBCUTANEOUS

## 2014-11-27 NOTE — Patient Instructions (Signed)
Filgrastim, G-CSF injection  What is this medicine?  FILGRASTIM, G-CSF (fil GRA stim) is a granulocyte colony-stimulating factor that stimulates the growth of neutrophils, a type of white blood cell (WBC) important in the body's fight against infection. It is used to reduce the incidence of fever and infection in patients with certain types of cancer who are receiving chemotherapy that affects the bone marrow, to stimulate blood cell production for removal of WBCs from the body prior to a bone marrow transplantation, to reduce the incidence of fever and infection in patients who have severe chronic neutropenia, and to improve survival outcomes following high-dose radiation exposure that is toxic to the bone marrow.  This medicine may be used for other purposes; ask your health care provider or pharmacist if you have questions.  COMMON BRAND NAME(S): Neupogen  What should I tell my health care provider before I take this medicine?  They need to know if you have any of these conditions:  -latex allergy  -ongoing radiation therapy  -sickle cell disease  -an unusual or allergic reaction to filgrastim, pegfilgrastim, other medicines, foods, dyes, or preservatives  -pregnant or trying to get pregnant  -breast-feeding  How should I use this medicine?  This medicine is for injection under the skin. If you get this medicine at home, you will be taught how to prepare and give this medicine. Refer to the Instructions for Use that come with your medication packaging. Use exactly as directed. Take your medicine at regular intervals. Do not take your medicine more often than directed.  It is important that you put your used needles and syringes in a special sharps container. Do not put them in a trash can. If you do not have a sharps container, call your pharmacist or healthcare provider to get one.  Talk to your pediatrician regarding the use of this medicine in children. While this drug may be prescribed for children as young  as 7 months for selected conditions, precautions do apply.  Overdosage: If you think you have taken too much of this medicine contact a poison control center or emergency room at once.  NOTE: This medicine is only for you. Do not share this medicine with others.  What if I miss a dose?  It is important not to miss your dose. Call your doctor or health care professional if you miss a dose.  What may interact with this medicine?  This medicine may interact with the following medications:  -medicines that may cause a release of neutrophils, such as lithium  This list may not describe all possible interactions. Give your health care provider a list of all the medicines, herbs, non-prescription drugs, or dietary supplements you use. Also tell them if you smoke, drink alcohol, or use illegal drugs. Some items may interact with your medicine.  What should I watch for while using this medicine?  You may need blood work done while you are taking this medicine.  What side effects may I notice from receiving this medicine?  Side effects that you should report to your doctor or health care professional as soon as possible:  -allergic reactions like skin rash, itching or hives, swelling of the face, lips, or tongue  -dizziness or feeling faint  -fever  -pain, redness, or irritation at site where injected  -pinpoint red spots on the skin  -shortness of breath or breathing problems  -stomach or side pain, or pain at the shoulder  -swelling  -tiredness  -trouble passing urine  -unusual   bleeding or bruising  Side effects that usually do not require medical attention (report to your doctor or health care professional if they continue or are bothersome):  -bone pain  -headache  -muscle pain  This list may not describe all possible side effects. Call your doctor for medical advice about side effects. You may report side effects to FDA at 1-800-FDA-1088.  Where should I keep my medicine?  Keep out of the reach of children.  Store in a  refrigerator between 2 and 8 degrees C (36 and 46 degrees F). Do not freeze. Keep in carton to protect from light. Throw away this medicine if vials or syringes are left out of the refrigerator for more than 24 hours. Throw away any unused medicine after the expiration date.  NOTE: This sheet is a summary. It may not cover all possible information. If you have questions about this medicine, talk to your doctor, pharmacist, or health care provider.   2015, Elsevier/Gold Standard. (2013-10-02 17:00:01)

## 2014-11-30 ENCOUNTER — Ambulatory Visit: Payer: Self-pay

## 2014-11-30 ENCOUNTER — Ambulatory Visit (HOSPITAL_BASED_OUTPATIENT_CLINIC_OR_DEPARTMENT_OTHER): Payer: Self-pay

## 2014-11-30 VITALS — BP 127/94 | HR 112 | Temp 99.2°F

## 2014-11-30 DIAGNOSIS — C562 Malignant neoplasm of left ovary: Secondary | ICD-10-CM

## 2014-11-30 DIAGNOSIS — Z5189 Encounter for other specified aftercare: Secondary | ICD-10-CM

## 2014-11-30 MED ORDER — FILGRASTIM 300 MCG/0.5ML IJ SOSY
300.0000 ug | PREFILLED_SYRINGE | Freq: Once | INTRAMUSCULAR | Status: AC
  Administered 2014-11-30: 300 ug via SUBCUTANEOUS
  Filled 2014-11-30: qty 0.5

## 2014-12-01 ENCOUNTER — Ambulatory Visit (HOSPITAL_BASED_OUTPATIENT_CLINIC_OR_DEPARTMENT_OTHER): Payer: Self-pay

## 2014-12-01 VITALS — BP 136/85 | HR 104 | Temp 98.7°F

## 2014-12-01 DIAGNOSIS — Z5189 Encounter for other specified aftercare: Secondary | ICD-10-CM

## 2014-12-01 DIAGNOSIS — C562 Malignant neoplasm of left ovary: Secondary | ICD-10-CM

## 2014-12-01 MED ORDER — FILGRASTIM 300 MCG/0.5ML IJ SOSY
300.0000 ug | PREFILLED_SYRINGE | Freq: Once | INTRAMUSCULAR | Status: AC
  Administered 2014-12-01: 300 ug via SUBCUTANEOUS
  Filled 2014-12-01: qty 0.5

## 2014-12-01 NOTE — Telephone Encounter (Addendum)
12-01-14. Ms. Sikkema has not brought names in for PCP referral.  Thayer Headings in injection reminded her that Dr. Marko Plume wanted this information and she was to bring it in this week. Patient may bring in 12-02-14 or possibly next office visit on 12-09-14.

## 2014-12-02 ENCOUNTER — Ambulatory Visit (HOSPITAL_BASED_OUTPATIENT_CLINIC_OR_DEPARTMENT_OTHER): Payer: Self-pay

## 2014-12-02 VITALS — BP 125/96 | HR 102 | Temp 98.8°F

## 2014-12-02 DIAGNOSIS — C562 Malignant neoplasm of left ovary: Secondary | ICD-10-CM

## 2014-12-02 DIAGNOSIS — Z5189 Encounter for other specified aftercare: Secondary | ICD-10-CM

## 2014-12-02 MED ORDER — FILGRASTIM 300 MCG/0.5ML IJ SOSY
300.0000 ug | PREFILLED_SYRINGE | Freq: Once | INTRAMUSCULAR | Status: AC
  Administered 2014-12-02: 300 ug via SUBCUTANEOUS
  Filled 2014-12-02: qty 0.5

## 2014-12-05 ENCOUNTER — Other Ambulatory Visit: Payer: Self-pay | Admitting: Oncology

## 2014-12-05 DIAGNOSIS — C562 Malignant neoplasm of left ovary: Secondary | ICD-10-CM

## 2014-12-06 ENCOUNTER — Telehealth: Payer: Self-pay

## 2014-12-06 NOTE — Telephone Encounter (Signed)
Dr. Marko Plume said to continue with warm soaks to left eye 4-5 times a day and contact eye doctor tomorrow to be seen. Left message in cell phone voice mail.

## 2014-12-06 NOTE — Telephone Encounter (Signed)
Spoke with Alyssa [name] about an issue with her left eye.  She stated that Dr. Marko Plume could make a referral to [facility]  to establish PCP.

## 2014-12-06 NOTE — Telephone Encounter (Signed)
Onset Sunday 12-05-14  Left bottom  eyelid was swollen itchy and pink. Top left eyelid itchy with a small white dot in center.   Applied warm soak bid yesterday  The white area is gone. The lower eyelid is more puffy and pink. The right eye not red but a little itchy today. Using dry eye moistening drops. Afebrile

## 2014-12-09 ENCOUNTER — Ambulatory Visit (HOSPITAL_BASED_OUTPATIENT_CLINIC_OR_DEPARTMENT_OTHER): Payer: Self-pay | Admitting: Oncology

## 2014-12-09 ENCOUNTER — Other Ambulatory Visit (HOSPITAL_BASED_OUTPATIENT_CLINIC_OR_DEPARTMENT_OTHER): Payer: Self-pay

## 2014-12-09 ENCOUNTER — Telehealth: Payer: Self-pay | Admitting: *Deleted

## 2014-12-09 ENCOUNTER — Encounter: Payer: Self-pay | Admitting: Oncology

## 2014-12-09 ENCOUNTER — Telehealth: Payer: Self-pay | Admitting: Oncology

## 2014-12-09 VITALS — BP 124/89 | HR 102 | Temp 98.4°F | Resp 18 | Ht 62.0 in | Wt 183.2 lb

## 2014-12-09 DIAGNOSIS — E876 Hypokalemia: Secondary | ICD-10-CM

## 2014-12-09 DIAGNOSIS — G620 Drug-induced polyneuropathy: Secondary | ICD-10-CM

## 2014-12-09 DIAGNOSIS — T451X5A Adverse effect of antineoplastic and immunosuppressive drugs, initial encounter: Secondary | ICD-10-CM

## 2014-12-09 DIAGNOSIS — N838 Other noninflammatory disorders of ovary, fallopian tube and broad ligament: Secondary | ICD-10-CM

## 2014-12-09 DIAGNOSIS — G622 Polyneuropathy due to other toxic agents: Secondary | ICD-10-CM

## 2014-12-09 DIAGNOSIS — R030 Elevated blood-pressure reading, without diagnosis of hypertension: Secondary | ICD-10-CM

## 2014-12-09 DIAGNOSIS — I10 Essential (primary) hypertension: Secondary | ICD-10-CM

## 2014-12-09 DIAGNOSIS — C562 Malignant neoplasm of left ovary: Secondary | ICD-10-CM

## 2014-12-09 LAB — COMPREHENSIVE METABOLIC PANEL (CC13)
ALT: 21 U/L (ref 0–55)
AST: 23 U/L (ref 5–34)
Albumin: 3.9 g/dL (ref 3.5–5.0)
Alkaline Phosphatase: 85 U/L (ref 40–150)
Anion Gap: 12 mEq/L — ABNORMAL HIGH (ref 3–11)
BUN: 17.6 mg/dL (ref 7.0–26.0)
CHLORIDE: 99 meq/L (ref 98–109)
CO2: 30 mEq/L — ABNORMAL HIGH (ref 22–29)
CREATININE: 1 mg/dL (ref 0.6–1.1)
Calcium: 10.2 mg/dL (ref 8.4–10.4)
EGFR: 66 mL/min/{1.73_m2} — AB (ref >90)
GLUCOSE: 112 mg/dL (ref 70–140)
Potassium: 2.9 mEq/L — CL (ref 3.5–5.1)
SODIUM: 141 meq/L (ref 136–145)
Total Bilirubin: 0.41 mg/dL (ref 0.20–1.20)
Total Protein: 8 g/dL (ref 6.4–8.3)

## 2014-12-09 LAB — CBC WITH DIFFERENTIAL/PLATELET
BASO%: 0.9 % (ref 0.0–2.0)
Basophils Absolute: 0 10*3/uL (ref 0.0–0.1)
EOS ABS: 0 10*3/uL (ref 0.0–0.5)
EOS%: 0.8 % (ref 0.0–7.0)
HEMATOCRIT: 37.9 % (ref 34.8–46.6)
HGB: 13 g/dL (ref 11.6–15.9)
LYMPH%: 20.9 % (ref 14.0–49.7)
MCH: 32.4 pg (ref 25.1–34.0)
MCHC: 34.2 g/dL (ref 31.5–36.0)
MCV: 94.8 fL (ref 79.5–101.0)
MONO#: 0.4 10*3/uL (ref 0.1–0.9)
MONO%: 7 % (ref 0.0–14.0)
NEUT#: 3.6 10*3/uL (ref 1.5–6.5)
NEUT%: 70.4 % (ref 38.4–76.8)
Platelets: 248 10*3/uL (ref 145–400)
RBC: 4 10*6/uL (ref 3.70–5.45)
RDW: 17.1 % — AB (ref 11.2–14.5)
WBC: 5.1 10*3/uL (ref 3.9–10.3)
lymph#: 1.1 10*3/uL (ref 0.9–3.3)

## 2014-12-09 MED ORDER — POTASSIUM CHLORIDE ER 10 MEQ PO TBCR: 20.0000 meq | EXTENDED_RELEASE_TABLET | Freq: Two times a day (BID) | ORAL | Status: DC

## 2014-12-09 MED ORDER — LORAZEPAM 0.5 MG PO TABS: ORAL_TABLET | ORAL | Status: DC

## 2014-12-09 MED ORDER — OMEPRAZOLE 20 MG PO CPDR: 20.0000 mg | DELAYED_RELEASE_CAPSULE | Freq: Every day | ORAL | Status: DC

## 2014-12-09 NOTE — Telephone Encounter (Signed)
[place]  faxed Prilosec refill request.  Request to provider's desk/in-basket for review.  Patient scheduled fro F/U today at 2:00 pm.

## 2014-12-09 NOTE — Telephone Encounter (Signed)
Spoke with [name] at office visit today 12-06-14 about PCP.  Told her that [facility] needs for her to call to make appointmnet.  The cost to her for new patient appointment with out insurance is  is $365.00. She stated that she will ask Dr. Marko Plume when she wants her to established a PCP.

## 2014-12-09 NOTE — Telephone Encounter (Signed)
Appointments made and avs printed for patient °

## 2014-12-09 NOTE — Progress Notes (Signed)
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 She has maintained counts with granix x 3 doses after each cycle.  Patient had increased nausea and vomiting after cycle 5 "my husband found me on the floor at home"; she did use SL ativan with worst of symptoms on days 3 and 4.  She did not notify us of this problem prior to visit today. She has had no N/V in past week. Bowels moved regularly using senokot, which she prefers to miralax. She has minimal peripheral neuropathy tips of fingers and toes, not bothersome.  Fatigue is less now that just after treatment.  No PAC No genetics evaluation  ONCOLOGIC HISTORY Patient had been in usual good health with entirely regular menstrual periods until indigestion and difficulty eating ~ late Nov 2015, then abdominal fullness and distension by late Dec. The abdominal swelling progressed such that she was seen by PCP at [facility], then ED at [facility] on 07-28-14. CT was obtained at [facility], reportedly with 26 x 12 cm cystic mass with enhancing septations and peripheral nodular soft tissue component, apparently arising from left ovary and occupying entire mid pelvis with deviation of otherwise normal appearing uterus; no ascites, omental cake, peritoneal carcinomatosis or adenopathy per CT. She was seen by Dr Armandina Stammer, with CA 125 68 and CEA normal. She was seen in consultation by Dr Denman George on 08-05-14 and, due to surgical availability, sent to Dr Suzan Slick at [facility]. Surgery on 08-11-14 was exploratory laparotomy with TAH, BSO and lymphadenectomy, R0 at completion although the ovary had ruptured preoperatively. Intraoperatively the appendix, bowel, nodes and upper abdomen were  unremarkable. She was DC on post op day 2 on lovenox, which she continues. 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 The primary tumor was 25 cm, no LVSI, 0/16 nodes involved appendix benign and pelvic washings positive (pT1c pN0; IC3). Case was presented at [facility] multidisciplinary conference, with consideration of FOLFOX vs carbo taxol; final recommendation was for [place] taxol. She had first carboplatin taxol on 08-31-14 and was neutropenic with ANC 0.1 on day 10, gCSF added.    Review of systems as above, also: No abdominal or pelvic pain. Tolerating Toprol XL 25 mg without difficulty, better BPs. Aches after taxol and granix not too severe.  Remainder of 10 point Review of Systems negative.  Objective:  Vital signs in last 24 hours:  BP 124/89 mmHg  Pulse 102  Temp(Src) 98.4 F (36.9 C) (Oral)  Resp 18  Ht 5\' 2"  (1.575 m)  Wt 183 lb 3.2 oz (83.099 kg)  BMI 33.50 kg/m2  SpO2 100% Weight up 2 lbs. Alert, oriented and appropriate. Ambulatory without difficulty.  Alopecia  HEENT:PERRL, sclerae not icteric. Oral mucosa moist without lesions, posterior pharynx clear.  Neck supple. No JVD.  Lymphatics:no cervical,supraclavicular or inguinal adenopathy Resp: clear to auscultation bilaterally and normal percussion bilaterally Cardio: regular rate and rhythm. No gallop. GI: soft, nontender, not distended, no mass or organomegaly. Normally active bowel sounds. Surgical incision not remarkable. Musculoskeletal/ Extremities: without pitting edema, cords, tenderness Neuro: no significant peripheral neuropathy. Otherwise nonfocal. Psych appropriate mood and affect Skin without rash, ecchymosis,  petechiae   Lab Results:  Results for orders placed or performed in visit on 12/09/14  CBC with Differential  Result Value Ref Range    WBC 5.1 3.9 - 10.3 10e3/uL   NEUT# 3.6 1.5 - 6.5 10e3/uL   HGB 13.0 11.6 - 15.9 g/dL   HCT 37.9 34.8 - 46.6 %   Platelets 248 145 - 400 10e3/uL   MCV 94.8 79.5 - 101.0 fL   MCH 32.4 25.1 - 34.0 pg   MCHC 34.2 31.5 - 36.0 g/dL   RBC 4.00 3.70 - 5.45 10e6/uL   RDW 17.1 (H) 11.2 - 14.5 %   lymph# 1.1 0.9 - 3.3 10e3/uL   MONO# 0.4 0.1 - 0.9 10e3/uL   Eosinophils Absolute 0.0 0.0 - 0.5 10e3/uL   Basophils Absolute 0.0 0.0 - 0.1 10e3/uL   NEUT% 70.4 38.4 - 76.8 %   LYMPH% 20.9 14.0 - 49.7 %   MONO% 7.0 0.0 - 14.0 %   EOS% 0.8 0.0 - 7.0 %   BASO% 0.9 0.0 - 2.0 %    CMET available prior to patient leaving office has K 2.9, with creatinine, other electrolytes and LFTs good.  Studies/Results:  No results found. She will have repeat CTs after completing chemo  Medications: I have reviewed the patient's current medications. She will increase potassium from present 20 mEq daily to 20 mEq bid, this to be refilled as 10 mEq tablets which should be easier for her to swallow. She will take additional 20 mEq x 2 doses today.   DISCUSSION: potassium as above, will recheck with labs on 6-14. Will add IVF ~ day 3 and start granix that day to consolidate trips to office.   Assessment/Plan: 1.IC3 mucinous adenocarcinoma of left ovary: preoperative rupture, surgery by gyn oncology at [facility] 08-11-14 (R0), and adjuvant carbo taxol begun 3-1-1. Cycle 6 will be given 12-14-14 as long as ANC >=1.5 and plt >=100k, granix x 4 afterwards. Will additionally give IVF on day 3 and will begin granix that day. 2.surgically induced menopause 3.hypokalemia: K low again today despite supplement and off diuretic. Increase oral potassium as noted and recheck with Bmet and Mg day of treatment 6-14. 4.elevated BPs : better on Toprol XL 25 mg. Expect PCP to manage this when she gets established at a primary care office. 5.BTL and endometrial ablation 6.constipation after chemo: much better with senokot S 7.no flu  vaccine 8.no advance directives, information given. 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 Mammogram scholarship application given to patient. No colonoscopy, which we will address after chemo completes if possible   All questions answered, patient in agreement with recommendations and plans. Chemo, granix and IVF orders done. Time spent 25 min including >50% counseling and coordination of care.     LIVESAY,LENNIS P, MD   12/09/2014, 2:42 PM

## 2014-12-11 MED ORDER — SODIUM CHLORIDE 0.9 % IV SOLN: Freq: Once | INTRAVENOUS | Status: DC

## 2014-12-11 MED ORDER — POTASSIUM CHLORIDE 2 MEQ/ML IV SOLN: INTRAVENOUS | Status: DC

## 2014-12-13 ENCOUNTER — Encounter: Payer: Self-pay | Admitting: Oncology

## 2014-12-13 NOTE — Progress Notes (Signed)
I faxed safety net form [phone] for neupogen

## 2014-12-14 ENCOUNTER — Telehealth: Payer: Self-pay

## 2014-12-14 ENCOUNTER — Ambulatory Visit (HOSPITAL_BASED_OUTPATIENT_CLINIC_OR_DEPARTMENT_OTHER): Payer: Self-pay

## 2014-12-14 ENCOUNTER — Other Ambulatory Visit (HOSPITAL_BASED_OUTPATIENT_CLINIC_OR_DEPARTMENT_OTHER): Payer: Self-pay

## 2014-12-14 VITALS — BP 127/83 | HR 111 | Temp 98.1°F | Resp 18

## 2014-12-14 DIAGNOSIS — C562 Malignant neoplasm of left ovary: Secondary | ICD-10-CM

## 2014-12-14 DIAGNOSIS — E876 Hypokalemia: Secondary | ICD-10-CM

## 2014-12-14 DIAGNOSIS — Z5111 Encounter for antineoplastic chemotherapy: Secondary | ICD-10-CM

## 2014-12-14 LAB — BASIC METABOLIC PANEL (CC13)
ANION GAP: 14 meq/L — AB (ref 3–11)
BUN: 21.1 mg/dL (ref 7.0–26.0)
CO2: 25 mEq/L (ref 22–29)
Calcium: 10.3 mg/dL (ref 8.4–10.4)
Chloride: 99 mEq/L (ref 98–109)
Creatinine: 1.1 mg/dL (ref 0.6–1.1)
EGFR: 56 mL/min/{1.73_m2} — ABNORMAL LOW (ref >90)
Glucose: 173 mg/dl — ABNORMAL HIGH (ref 70–140)
POTASSIUM: 3.2 meq/L — AB (ref 3.5–5.1)
SODIUM: 138 meq/L (ref 136–145)

## 2014-12-14 LAB — CBC WITH DIFFERENTIAL/PLATELET
BASO%: 0 % (ref 0.0–2.0)
Basophils Absolute: 0 10*3/uL (ref 0.0–0.1)
EOS%: 0 % (ref 0.0–7.0)
Eosinophils Absolute: 0 10*3/uL (ref 0.0–0.5)
HCT: 33.9 % — ABNORMAL LOW (ref 34.8–46.6)
HEMOGLOBIN: 11.6 g/dL (ref 11.6–15.9)
LYMPH%: 7.3 % — ABNORMAL LOW (ref 14.0–49.7)
MCH: 32.9 pg (ref 25.1–34.0)
MCHC: 34.2 g/dL (ref 31.5–36.0)
MCV: 96 fL (ref 79.5–101.0)
MONO#: 0 10*3/uL — ABNORMAL LOW (ref 0.1–0.9)
MONO%: 0.9 % (ref 0.0–14.0)
NEUT%: 91.8 % — ABNORMAL HIGH (ref 38.4–76.8)
NEUTROS ABS: 3.1 10*3/uL (ref 1.5–6.5)
Platelets: 263 10*3/uL (ref 145–400)
RBC: 3.53 10*6/uL — AB (ref 3.70–5.45)
RDW: 15.1 % — AB (ref 11.2–14.5)
WBC: 3.4 10*3/uL — AB (ref 3.9–10.3)
lymph#: 0.3 10*3/uL — ABNORMAL LOW (ref 0.9–3.3)

## 2014-12-14 LAB — MAGNESIUM (CC13): MAGNESIUM: 2 mg/dL (ref 1.5–2.5)

## 2014-12-14 MED ORDER — SODIUM CHLORIDE 0.9 % IV SOLN
Freq: Once | INTRAVENOUS | Status: AC
  Administered 2014-12-14: 09:00:00 via INTRAVENOUS

## 2014-12-14 MED ORDER — FAMOTIDINE IN NACL 20-0.9 MG/50ML-% IV SOLN
20.0000 mg | Freq: Once | INTRAVENOUS | Status: AC
  Administered 2014-12-14: 20 mg via INTRAVENOUS

## 2014-12-14 MED ORDER — SODIUM CHLORIDE 0.9 % IV SOLN
650.0000 mg | Freq: Once | INTRAVENOUS | Status: AC
  Administered 2014-12-14: 650 mg via INTRAVENOUS
  Filled 2014-12-14: qty 65

## 2014-12-14 MED ORDER — PACLITAXEL CHEMO INJECTION 300 MG/50ML
175.0000 mg/m2 | Freq: Once | INTRAVENOUS | Status: AC
  Administered 2014-12-14: 330 mg via INTRAVENOUS
  Filled 2014-12-14: qty 55

## 2014-12-14 MED ORDER — FAMOTIDINE IN NACL 20-0.9 MG/50ML-% IV SOLN
INTRAVENOUS | Status: AC
  Filled 2014-12-14: qty 50

## 2014-12-14 MED ORDER — DEXAMETHASONE SODIUM PHOSPHATE 100 MG/10ML IJ SOLN
Freq: Once | INTRAMUSCULAR | Status: AC
  Administered 2014-12-14: 09:00:00 via INTRAVENOUS
  Filled 2014-12-14: qty 8

## 2014-12-14 MED ORDER — DIPHENHYDRAMINE HCL 50 MG/ML IJ SOLN
50.0000 mg | Freq: Once | INTRAMUSCULAR | Status: AC
  Administered 2014-12-14: 50 mg via INTRAVENOUS

## 2014-12-14 MED ORDER — DIPHENHYDRAMINE HCL 50 MG/ML IJ SOLN
INTRAMUSCULAR | Status: AC
  Filled 2014-12-14: qty 1

## 2014-12-14 NOTE — Telephone Encounter (Signed)
Gave Alyssa [name] the potassium tablet instructions as noted below by Dr. Marko Plume.  Alyssa [name] wrote instructions down and repeated back to this nurse correctly.

## 2014-12-14 NOTE — Telephone Encounter (Signed)
Told Alyssa [name] that her KCL level is up to 3.2 today.  She continues to take KCL 20 meq bid and increasing potassium in her diet. She will be called If Dr. Marko Plume wants to make ay adjustments to her KCL dosing. Patient verbalized understanding.

## 2014-12-14 NOTE — Telephone Encounter (Signed)
-----   Message from Gordy Levan, MD sent at 12/14/2014 11:07 AM EDT ----- Labs seen and need follow up: if taking 20 mEq bid now, have her increase this to 20 tid for 3 days then stay at 20 bid.

## 2014-12-14 NOTE — Patient Instructions (Signed)
[facility] Discharge Instructions for Patients Receiving Chemotherapy  Today you received the following chemotherapy agents Taxol /Carboplatin.  To help prevent nausea and vomiting after your treatment, we encourage you to take your nausea medication as needed   If you develop nausea and vomiting that is not controlled by your nausea medication, call the clinic.   BELOW ARE SYMPTOMS THAT SHOULD BE REPORTED IMMEDIATELY:  *FEVER GREATER THAN 100.5 F  *CHILLS WITH OR WITHOUT FEVER  NAUSEA AND VOMITING THAT IS NOT CONTROLLED WITH YOUR NAUSEA MEDICATION  *UNUSUAL SHORTNESS OF BREATH  *UNUSUAL BRUISING OR BLEEDING  TENDERNESS IN MOUTH AND THROAT WITH OR WITHOUT PRESENCE OF ULCERS  *URINARY PROBLEMS  *BOWEL PROBLEMS  UNUSUAL RASH Items with * indicate a potential emergency and should be followed up as soon as possible.  Feel free to call the clinic you have any questions or concerns. The clinic phone number is [phone].  Please show the CHEMO ALERT CARD at check-in to the Emergency Department and triage nurse.   

## 2014-12-14 NOTE — Telephone Encounter (Signed)
Ms. Nobile stated that she will go back to [employer] in [place] with Dr. Ennis Forts for now.  A visit would be ~$110.00 weare as going to [facility] would be$365.00 for a new patient visit.  She should have insurance in January of 2017 and look for another PCP at that time.

## 2014-12-14 NOTE — Telephone Encounter (Signed)
-----   Message from Gordy Levan, MD sent at 12/11/2014 12:05 PM EDT ----- For BMET and Mg on 6-14 day of chemo  On K 20 mEq bid since 6-9, for K 2.9 that day

## 2014-12-16 ENCOUNTER — Other Ambulatory Visit: Payer: Self-pay | Admitting: Oncology

## 2014-12-16 ENCOUNTER — Ambulatory Visit (HOSPITAL_BASED_OUTPATIENT_CLINIC_OR_DEPARTMENT_OTHER): Payer: Self-pay

## 2014-12-16 ENCOUNTER — Ambulatory Visit: Payer: Self-pay

## 2014-12-16 VITALS — BP 123/82 | HR 119 | Temp 97.1°F | Resp 18

## 2014-12-16 DIAGNOSIS — C562 Malignant neoplasm of left ovary: Secondary | ICD-10-CM

## 2014-12-16 DIAGNOSIS — E876 Hypokalemia: Secondary | ICD-10-CM

## 2014-12-16 DIAGNOSIS — Z5189 Encounter for other specified aftercare: Secondary | ICD-10-CM

## 2014-12-16 MED ORDER — FILGRASTIM 300 MCG/0.5ML IJ SOSY
300.0000 ug | PREFILLED_SYRINGE | Freq: Once | INTRAMUSCULAR | Status: AC
  Administered 2014-12-16: 300 ug via SUBCUTANEOUS
  Filled 2014-12-16: qty 0.5

## 2014-12-16 MED ORDER — SODIUM CHLORIDE 0.9 % IV SOLN
Freq: Once | INTRAVENOUS | Status: AC
  Administered 2014-12-16: 09:00:00 via INTRAVENOUS
  Filled 2014-12-16: qty 4

## 2014-12-16 MED ORDER — SODIUM CHLORIDE 0.9 % IV SOLN
INTRAVENOUS | Status: DC
  Administered 2014-12-16: 09:00:00 via INTRAVENOUS
  Filled 2014-12-16: qty 1000

## 2014-12-16 NOTE — Patient Instructions (Signed)
Dehydration, Adult Dehydration is when you lose more fluids from the body than you take in. Vital organs like the kidneys, brain, and heart cannot function without a proper amount of fluids and salt. Any loss of fluids from the body can cause dehydration.  CAUSES   Vomiting.  Diarrhea.  Excessive sweating.  Excessive urine output.  Fever. SYMPTOMS  Mild dehydration  Thirst.  Dry lips.  Slightly dry mouth. Moderate dehydration  Very dry mouth.  Sunken eyes.  Skin does not bounce back quickly when lightly pinched and released.  Dark urine and decreased urine production.  Decreased tear production.  Headache. Severe dehydration  Very dry mouth.  Extreme thirst.  Rapid, weak pulse (more than 100 beats per minute at rest).  Cold hands and feet.  Not able to sweat in spite of heat and temperature.  Rapid breathing.  Blue lips.  Confusion and lethargy.  Difficulty being awakened.  Minimal urine production.  No tears. DIAGNOSIS  Your caregiver will diagnose dehydration based on your symptoms and your exam. Blood and urine tests will help confirm the diagnosis. The diagnostic evaluation should also identify the cause of dehydration. TREATMENT  Treatment of mild or moderate dehydration can often be done at home by increasing the amount of fluids that you drink. It is best to drink small amounts of fluid more often. Drinking too much at one time can make vomiting worse. Refer to the home care instructions below. Severe dehydration needs to be treated at the hospital where you will probably be given intravenous (IV) fluids that contain water and electrolytes. HOME CARE INSTRUCTIONS   Ask your caregiver about specific rehydration instructions.  Drink enough fluids to keep your urine clear or pale yellow.  Drink small amounts frequently if you have nausea and vomiting.  Eat as you normally do.  Avoid:  Foods or drinks high in sugar.  Carbonated  drinks.  Juice.  Extremely hot or cold fluids.  Drinks with caffeine.  Fatty, greasy foods.  Alcohol.  Tobacco.  Overeating.  Gelatin desserts.  Wash your hands well to avoid spreading bacteria and viruses.  Only take over-the-counter or prescription medicines for pain, discomfort, or fever as directed by your caregiver.  Ask your caregiver if you should continue all prescribed and over-the-counter medicines.  Keep all follow-up appointments with your caregiver. SEEK MEDICAL CARE IF:  You have abdominal pain and it increases or stays in one area (localizes).  You have a rash, stiff neck, or severe headache.  You are irritable, sleepy, or difficult to awaken.  You are weak, dizzy, or extremely thirsty. SEEK IMMEDIATE MEDICAL CARE IF:   You are unable to keep fluids down or you get worse despite treatment.  You have frequent episodes of vomiting or diarrhea.  You have blood or green matter (bile) in your vomit.  You have blood in your stool or your stool looks black and tarry.  You have not urinated in 6 to 8 hours, or you have only urinated a small amount of very dark urine.  You have a fever.  You faint. MAKE SURE YOU:   Understand these instructions.  Will watch your condition.  Will get help right away if you are not doing well or get worse. Document Released: 06/18/2005 Document Revised: 09/10/2011 Document Reviewed: 02/05/2011 ExitCare Patient Information 2015 [employer]. This information is not intended to replace advice given to you by your health care provider. Make sure you discuss any questions you have with your health care   provider.  

## 2014-12-17 ENCOUNTER — Encounter: Payer: Self-pay | Admitting: Oncology

## 2014-12-17 ENCOUNTER — Telehealth: Payer: Self-pay | Admitting: *Deleted

## 2014-12-17 NOTE — Progress Notes (Signed)
I faxed [employer] form and notes  [phone]

## 2014-12-17 NOTE — Progress Notes (Signed)
Holland Falling is refaxing some forms. I called and spoke with [facility]

## 2014-12-17 NOTE — Telephone Encounter (Signed)
CHECKED WITH RAQUEL BROWNING IN [employer]. SHE HAS NOT RECEIVED THE SHORT TERM DISABILITY FORM. RAQUEL WILL CALL [employer] AT [phone] AND HAVE THE FORM FAXED TO MANAGED CARE. LEFT VOICE MAIL ON PT.'S PHONE WITH THE ABOVE INFORMATION.

## 2014-12-18 ENCOUNTER — Ambulatory Visit (HOSPITAL_BASED_OUTPATIENT_CLINIC_OR_DEPARTMENT_OTHER): Payer: Self-pay

## 2014-12-18 VITALS — BP 126/87 | HR 92 | Temp 97.3°F | Resp 20

## 2014-12-18 DIAGNOSIS — Z5189 Encounter for other specified aftercare: Secondary | ICD-10-CM

## 2014-12-18 DIAGNOSIS — C562 Malignant neoplasm of left ovary: Secondary | ICD-10-CM

## 2014-12-18 MED ORDER — FILGRASTIM 300 MCG/0.5ML IJ SOSY
300.0000 ug | PREFILLED_SYRINGE | Freq: Once | INTRAMUSCULAR | Status: AC
  Administered 2014-12-18: 300 ug via SUBCUTANEOUS

## 2014-12-20 ENCOUNTER — Ambulatory Visit (HOSPITAL_BASED_OUTPATIENT_CLINIC_OR_DEPARTMENT_OTHER): Payer: Self-pay

## 2014-12-20 VITALS — BP 125/91 | HR 107 | Temp 98.8°F

## 2014-12-20 DIAGNOSIS — Z5189 Encounter for other specified aftercare: Secondary | ICD-10-CM

## 2014-12-20 DIAGNOSIS — C562 Malignant neoplasm of left ovary: Secondary | ICD-10-CM

## 2014-12-20 MED ORDER — FILGRASTIM 300 MCG/0.5ML IJ SOSY
300.0000 ug | PREFILLED_SYRINGE | Freq: Once | INTRAMUSCULAR | Status: AC
  Administered 2014-12-20: 300 ug via SUBCUTANEOUS
  Filled 2014-12-20: qty 0.5

## 2014-12-21 ENCOUNTER — Ambulatory Visit (HOSPITAL_BASED_OUTPATIENT_CLINIC_OR_DEPARTMENT_OTHER): Payer: Self-pay

## 2014-12-21 VITALS — BP 122/96 | HR 105 | Temp 98.7°F

## 2014-12-21 DIAGNOSIS — C562 Malignant neoplasm of left ovary: Secondary | ICD-10-CM

## 2014-12-21 DIAGNOSIS — Z5189 Encounter for other specified aftercare: Secondary | ICD-10-CM

## 2014-12-21 MED ORDER — FILGRASTIM 300 MCG/0.5ML IJ SOSY
300.0000 ug | PREFILLED_SYRINGE | Freq: Once | INTRAMUSCULAR | Status: AC
  Administered 2014-12-21: 300 ug via SUBCUTANEOUS
  Filled 2014-12-21: qty 0.5

## 2014-12-22 ENCOUNTER — Encounter: Payer: Self-pay | Admitting: Oncology

## 2014-12-22 ENCOUNTER — Ambulatory Visit: Payer: Self-pay

## 2014-12-22 NOTE — Progress Notes (Signed)
Per Alyssa [name] neupagen will be delivered tomorrow. Information as verified for her. [phone]

## 2014-12-26 ENCOUNTER — Other Ambulatory Visit: Payer: Self-pay | Admitting: Oncology

## 2014-12-26 DIAGNOSIS — C562 Malignant neoplasm of left ovary: Secondary | ICD-10-CM

## 2014-12-30 ENCOUNTER — Telehealth: Payer: Self-pay | Admitting: Oncology

## 2014-12-30 ENCOUNTER — Encounter: Payer: Self-pay | Admitting: Oncology

## 2014-12-30 ENCOUNTER — Ambulatory Visit (HOSPITAL_BASED_OUTPATIENT_CLINIC_OR_DEPARTMENT_OTHER): Payer: Self-pay | Admitting: Oncology

## 2014-12-30 ENCOUNTER — Other Ambulatory Visit (HOSPITAL_BASED_OUTPATIENT_CLINIC_OR_DEPARTMENT_OTHER): Payer: Self-pay

## 2014-12-30 VITALS — BP 136/87 | HR 111 | Temp 99.0°F | Resp 18 | Ht 62.0 in | Wt 186.2 lb

## 2014-12-30 DIAGNOSIS — C562 Malignant neoplasm of left ovary: Secondary | ICD-10-CM

## 2014-12-30 DIAGNOSIS — T451X5A Adverse effect of antineoplastic and immunosuppressive drugs, initial encounter: Secondary | ICD-10-CM

## 2014-12-30 DIAGNOSIS — I10 Essential (primary) hypertension: Secondary | ICD-10-CM

## 2014-12-30 DIAGNOSIS — G620 Drug-induced polyneuropathy: Secondary | ICD-10-CM

## 2014-12-30 DIAGNOSIS — G622 Polyneuropathy due to other toxic agents: Secondary | ICD-10-CM

## 2014-12-30 DIAGNOSIS — E876 Hypokalemia: Secondary | ICD-10-CM

## 2014-12-30 LAB — COMPREHENSIVE METABOLIC PANEL (CC13)
ALK PHOS: 79 U/L (ref 40–150)
ALT: 25 U/L (ref 0–55)
AST: 22 U/L (ref 5–34)
Albumin: 4 g/dL (ref 3.5–5.0)
Anion Gap: 14 mEq/L — ABNORMAL HIGH (ref 3–11)
BILIRUBIN TOTAL: 0.38 mg/dL (ref 0.20–1.20)
BUN: 21.9 mg/dL (ref 7.0–26.0)
CALCIUM: 10.2 mg/dL (ref 8.4–10.4)
CHLORIDE: 99 meq/L (ref 98–109)
CO2: 29 mEq/L (ref 22–29)
CREATININE: 1.2 mg/dL — AB (ref 0.6–1.1)
EGFR: 55 mL/min/{1.73_m2} — ABNORMAL LOW (ref >90)
Glucose: 114 mg/dl (ref 70–140)
Potassium: 3 mEq/L — CL (ref 3.5–5.1)
Sodium: 142 mEq/L (ref 136–145)
TOTAL PROTEIN: 7.8 g/dL (ref 6.4–8.3)

## 2014-12-30 LAB — CBC WITH DIFFERENTIAL/PLATELET
BASO%: 0.7 % (ref 0.0–2.0)
BASOS ABS: 0 10*3/uL (ref 0.0–0.1)
EOS ABS: 0 10*3/uL (ref 0.0–0.5)
EOS%: 1.1 % (ref 0.0–7.0)
HCT: 34.7 % — ABNORMAL LOW (ref 34.8–46.6)
HGB: 11.8 g/dL (ref 11.6–15.9)
LYMPH#: 0.9 10*3/uL (ref 0.9–3.3)
LYMPH%: 33 % (ref 14.0–49.7)
MCH: 33.6 pg (ref 25.1–34.0)
MCHC: 34 g/dL (ref 31.5–36.0)
MCV: 98.9 fL (ref 79.5–101.0)
MONO#: 0.2 10*3/uL (ref 0.1–0.9)
MONO%: 9 % (ref 0.0–14.0)
NEUT%: 56.2 % (ref 38.4–76.8)
NEUTROS ABS: 1.5 10*3/uL (ref 1.5–6.5)
PLATELETS: 198 10*3/uL (ref 145–400)
RBC: 3.51 10*6/uL — AB (ref 3.70–5.45)
RDW: 14.5 % (ref 11.2–14.5)
WBC: 2.7 10*3/uL — ABNORMAL LOW (ref 3.9–10.3)

## 2014-12-30 LAB — CA 125: CA 125: 13 U/mL (ref <35)

## 2014-12-30 NOTE — Progress Notes (Signed)
OFFICE PROGRESS NOTE   December 30, 2014   Physicians:Emma Rossi/ [place] Bae-Jump,C.Gaccione  INTERVAL HISTORY:  Patient is seen, alone for visit, in continuing attention to adjuvant chemotherapy for IC3 mucinous adenocarcinoma of left ovary, cycle 6 carbo taxol given 12-14-14 with granix x 4 days afterwards. Plan is for CT 01-17-15 and to see Dr Denman George on 01-27-15.   Still fatigued. Less nausea but more aches after most recent treatment, mostly resolved now. Occasional tingling in hands and feet tho none at all presently, and feet more bothersome at night (better when cool). Bowels fine. Appetite improving. Bowels moving regularly.   No PAC Genetics counseling ordered. Pre op CA125 was 68  She will be able to work reduced hours initially when she is back at work in early August  ONCOLOGIC HISTORY Patient had been in usual good health with entirely regular menstrual periods until indigestion and difficulty eating ~ late Nov 2015, then abdominal fullness and distension by late Dec. The abdominal swelling progressed such that she was seen by PCP at [facility], then ED at [facility] on 07-28-14. CT was obtained at [facility], reportedly with 26 x 12 cm cystic mass with enhancing septations and peripheral nodular soft tissue component, apparently arising from left ovary and occupying entire mid pelvis with deviation of otherwise normal appearing uterus; no ascites, omental cake, peritoneal carcinomatosis or adenopathy per CT. She was seen by Dr Armandina Stammer, with CA 125 68 and CEA normal. She was seen in consultation by Dr Denman George on 08-05-14 and, due to surgical availability, sent to Dr Suzan Slick at [facility]. Surgery on 08-11-14 was exploratory laparotomy with TAH, BSO and lymphadenectomy, R0 at completion although the ovary had ruptured preoperatively. Intraoperatively the appendix, bowel, nodes and upper abdomen were unremarkable. She was DC on post op day 2 on lovenox, which she continues. 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 The primary tumor was 25 cm, no LVSI, 0/16 nodes involved appendix benign and pelvic washings positive (pT1c pN0; IC3). Case was presented at [facility] multidisciplinary conference, with consideration of FOLFOX vs carbo taxol; final recommendation was for [place] taxol. She had first carboplatin taxol on 08-31-14 and was neutropenic with ANC 0.1 on day 10, gCSF added. Cycle 6 carbo taxol given 12-14-14.     Review of systems as above, also: nontender slightly firm areas at veins lateral right wrist and dorsum left hand, with some local irritation with benadryl at last infusion. Bladder ok. Less hot flashes. No bleeding. No SOB.  Remainder of 10 point Review of Systems negative.  Objective:  Vital signs in last 24 hours:  BP 136/87 mmHg  Pulse 111  Temp(Src) 99 F (37.2 C) (Oral)  Resp 18  Ht 5\' 2"  (1.575 m)  Wt 186 lb 3.2 oz (84.46 kg)  BMI 34.05 kg/m2  SpO2 100% Weight up 3 lbs Alert, oriented and appropriate. Ambulatory without difficulty.  Complete alopecia  HEENT:PERRL, sclerae not icteric. Oral mucosa moist without lesions, posterior pharynx clear.  Neck supple. No JVD.  Lymphatics:no cervical,supraclavicular, axillary or inguinal adenopathy Resp: clear to auscultation bilaterally and normal percussion bilaterally Cardio: regular rate and rhythm. No gallop. GI: soft, nontender, not distended, no mass or organomegaly. Normally active bowel sounds. Surgical incision not remarkable. Musculoskeletal/ Extremities: without pitting edema, cords, tenderness Neuro: no peripheral neuropathy. Otherwise nonfocal. PSYCH appropriate mood and affect Skin without rash,  ecchymosis, petechiae  Lab Results:  Results for orders placed or performed in visit on 12/30/14  CBC with Differential/Platelet   Result Value Ref Range   WBC 2.7 (L) 3.9 - 10.3 10e3/uL   NEUT# 1.5 1.5 - 6.5 10e3/uL   HGB 11.8 11.6 - 15.9 g/dL   HCT 34.7 (L) 34.8 - 46.6 %   Platelets 198 145 - 400 10e3/uL   MCV 98.9 79.5 - 101.0 fL   MCH 33.6 25.1 - 34.0 pg   MCHC 34.0 31.5 - 36.0 g/dL   RBC 3.51 (L) 3.70 - 5.45 10e6/uL   RDW 14.5 11.2 - 14.5 %   lymph# 0.9 0.9 - 3.3 10e3/uL   MONO# 0.2 0.1 - 0.9 10e3/uL   Eosinophils Absolute 0.0 0.0 - 0.5 10e3/uL   Basophils Absolute 0.0 0.0 - 0.1 10e3/uL   NEUT% 56.2 38.4 - 76.8 %   LYMPH% 33.0 14.0 - 49.7 %   MONO% 9.0 0.0 - 14.0 %   EOS% 1.1 0.0 - 7.0 %   BASO% 0.7 0.0 - 2.0 %   CMET available during visit has K 3.0 despite KCl 20 mEq bid now (see below)  Studies/Results:  No results found.  Medications: I have reviewed the patient's current medications. Increase K to 20 mEq 3x daily x 2 days then continue 20 mEq bid.  DISCUSSION She understands that it can take several months after completing treatment before energy is really back to baseline. Discussed lifestyle interventions for the slight peripheral neuropathy, which hopefully will continue to improve off of chemo. Mentioned GOG diet and exercise study, which she would be interested in if eligible after re-evaluation upcoming. She understands that weight loss to ideal may be helpful long term with gyn and other cancers.  Has not had mammograms yet, tho returned scholarship form some weeks ago. I have spoken with outreach office and patient has completed form again now, which that office will send for processing. That office to let us know when approved, so that mammograms can be scheduled ([phone]) She has not had colonoscopy, which will need to be set up when she is further out from chemo.  Discussed genetics counseling, which she is interested in pursuing. WIll try to set this up  for 7-28 coordinating with gyn onc visit that day. Daughter will be with her at that visit.   Mentioned GOG 0225 diet and exercise  study, which she would like to consider if appropriate after restaging.  Will recheck BMET, CBC Mg, CA 125  when she comes for gyn onc appointment on 01-27-15  Assessment/Plan:  1.IC3 mucinous adenocarcinoma of left ovary: preoperative rupture, surgery by gyn oncology at [facility] 08-11-14 (R0), and adjuvant carbo taxol x 6 cycles from 3-1-1 thru 12-14-14, with granix support. CT 01-17-15, gyn onc 01-27-15, genetics counseling. If NED she is interested in GOG diet and exercise study. 2.surgically induced menopause 3.hypokalemia: K low again today despite supplement and off diuretic. Increase oral potassium as noted and recheck with gyn onc visit.  4.elevated BPs : better on Toprol XL 25 mg.  PCP to manage from here 5.BTL and endometrial ablation 6.constipation after chemo: much better with senokot S 7.no flu vaccine 8.no advance directives, information given. 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 Mammogram scholarship done and given to outreach office staff now. No colonoscopy, which we will try to address soon.   All  questions answered. She knows to call prior to next scheduled appointment if needed. Time spent 25 min including >50% counseling and coordination of care.   LIVESAY,LENNIS P, MD   12/30/2014, 9:10 AM

## 2014-12-30 NOTE — Telephone Encounter (Signed)
Appointments made and avs printed for patient °

## 2015-01-13 ENCOUNTER — Other Ambulatory Visit: Payer: Self-pay | Admitting: Gynecologic Oncology

## 2015-01-13 DIAGNOSIS — Z1231 Encounter for screening mammogram for malignant neoplasm of breast: Secondary | ICD-10-CM

## 2015-01-17 ENCOUNTER — Ambulatory Visit (HOSPITAL_COMMUNITY)
Admission: RE | Admit: 2015-01-17 | Discharge: 2015-01-17 | Disposition: A | Discharge status: Home / Self Care | Payer: Self-pay | Source: Ambulatory Visit | Attending: Gynecologic Oncology | Admitting: Gynecologic Oncology

## 2015-01-17 ENCOUNTER — Ambulatory Visit (HOSPITAL_COMMUNITY): Payer: Self-pay

## 2015-01-17 ENCOUNTER — Ambulatory Visit (HOSPITAL_COMMUNITY)
Admission: RE | Admit: 2015-01-17 | Discharge: 2015-01-17 | Disposition: A | Discharge status: Home / Self Care | Payer: Self-pay | Source: Ambulatory Visit | Attending: Oncology | Admitting: Oncology

## 2015-01-17 ENCOUNTER — Encounter (HOSPITAL_COMMUNITY): Payer: Self-pay

## 2015-01-17 DIAGNOSIS — Z08 Encounter for follow-up examination after completed treatment for malignant neoplasm: Secondary | ICD-10-CM | POA: Insufficient documentation

## 2015-01-17 DIAGNOSIS — Z8543 Personal history of malignant neoplasm of ovary: Secondary | ICD-10-CM | POA: Insufficient documentation

## 2015-01-17 DIAGNOSIS — C562 Malignant neoplasm of left ovary: Secondary | ICD-10-CM

## 2015-01-17 DIAGNOSIS — Z1231 Encounter for screening mammogram for malignant neoplasm of breast: Secondary | ICD-10-CM

## 2015-01-17 MED ORDER — IOHEXOL 300 MG/ML  SOLN
100.0000 mL | Freq: Once | INTRAMUSCULAR | Status: AC | PRN
  Administered 2015-01-17: 100 mL via INTRAVENOUS

## 2015-01-27 ENCOUNTER — Ambulatory Visit: Payer: Self-pay | Attending: Gynecologic Oncology | Admitting: Gynecologic Oncology

## 2015-01-27 ENCOUNTER — Encounter: Payer: Self-pay | Admitting: Genetic Counselor

## 2015-01-27 ENCOUNTER — Other Ambulatory Visit (HOSPITAL_BASED_OUTPATIENT_CLINIC_OR_DEPARTMENT_OTHER): Payer: Self-pay

## 2015-01-27 ENCOUNTER — Telehealth: Payer: Self-pay

## 2015-01-27 ENCOUNTER — Telehealth: Payer: Self-pay | Admitting: *Deleted

## 2015-01-27 ENCOUNTER — Encounter: Payer: Self-pay | Admitting: Gynecologic Oncology

## 2015-01-27 VITALS — BP 132/95 | HR 105 | Temp 98.6°F | Resp 18 | Ht 62.0 in | Wt 188.7 lb

## 2015-01-27 DIAGNOSIS — C562 Malignant neoplasm of left ovary: Secondary | ICD-10-CM

## 2015-01-27 LAB — CBC WITH DIFFERENTIAL/PLATELET
BASO%: 1.2 % (ref 0.0–2.0)
Basophils Absolute: 0 10*3/uL (ref 0.0–0.1)
EOS%: 4.1 % (ref 0.0–7.0)
Eosinophils Absolute: 0.2 10*3/uL (ref 0.0–0.5)
HEMATOCRIT: 38.9 % (ref 34.8–46.6)
HGB: 13.4 g/dL (ref 11.6–15.9)
LYMPH%: 20.2 % (ref 14.0–49.7)
MCH: 34.2 pg — AB (ref 25.1–34.0)
MCHC: 34.5 g/dL (ref 31.5–36.0)
MCV: 99 fL (ref 79.5–101.0)
MONO#: 0.3 10*3/uL (ref 0.1–0.9)
MONO%: 6.8 % (ref 0.0–14.0)
NEUT#: 2.5 10*3/uL (ref 1.5–6.5)
NEUT%: 67.7 % (ref 38.4–76.8)
PLATELETS: 277 10*3/uL (ref 145–400)
RBC: 3.93 10*6/uL (ref 3.70–5.45)
RDW: 13.1 % (ref 11.2–14.5)
WBC: 3.8 10*3/uL — ABNORMAL LOW (ref 3.9–10.3)
lymph#: 0.8 10*3/uL — ABNORMAL LOW (ref 0.9–3.3)

## 2015-01-27 LAB — BASIC METABOLIC PANEL (CC13)
ANION GAP: 12 meq/L — AB (ref 3–11)
BUN: 16 mg/dL (ref 7.0–26.0)
CALCIUM: 10 mg/dL (ref 8.4–10.4)
CHLORIDE: 98 meq/L (ref 98–109)
CO2: 32 mEq/L — ABNORMAL HIGH (ref 22–29)
Creatinine: 1.1 mg/dL (ref 0.6–1.1)
EGFR: 60 mL/min/{1.73_m2} — ABNORMAL LOW (ref >90)
GLUCOSE: 114 mg/dL (ref 70–140)
Potassium: 2.9 mEq/L — CL (ref 3.5–5.1)
SODIUM: 141 meq/L (ref 136–145)

## 2015-01-27 LAB — MAGNESIUM (CC13): MAGNESIUM: 1.8 mg/dL (ref 1.5–2.5)

## 2015-01-27 NOTE — Telephone Encounter (Signed)
S/w patient about her potassium being low. She stated she already heard from someone today and will be increasing her potassium.

## 2015-01-27 NOTE — Patient Instructions (Signed)
We will see you in September with a CT scan to evaluate the area in the left pelvic sidewall prior to your visit.  Call if you develop any symptoms that remain persistent for two weeks or more.  Please call for any questions or concerns.

## 2015-01-27 NOTE — Progress Notes (Signed)
Followup Note: Gyn-Onc  Consult was requested by Dr. Marin [name] for the evaluation of Alyssa [name] 54 y.o. female with an ovarian mass and elevated CA 125  CC:  Chief Complaint  Patient presents with  . carcinoma of left ovary    follow-up    Assessment/Plan:  [name]  is a 54 y.o.  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   1/ left pelvic mass on CT imaging: I personally reviewed the films. The cystic left pelvic side wall mass appears most consistent with a lymphocyst. It is smooth and regular in appearnce and cystic in appearance. Additioanlly, her CA 125 is normal. However, given her recent hx of mucinous ovarian cancer and concern for persistent disease/ recurrence, we will repeat the CT of the abdomen and pelvis in 2 months (September, 2016) and she will see me then for surveillance. If it is stable, no additional surveillance is necessary.   2/ Followup: Dr Alyssa [name] in 1 month, and with me in 27months.  3/ She was counseled regarding symptoms of recurrence and to notify us sooner if these develop  4/ Genetics: all patients with epithelial ovarian cancer are recommended to undergo genetic screening. She has her appointment today.  5/ Study: she is a candidat for [employer] and exercise study. I endorse this.  HPI: Alyssa [name] is a 54 year old G3 P3 who is seen in consultation at the request of Dr. Marin [name] for a 26 cm pelvic mass and elevated CA-125. The patient began noting vague abdominal fullness and distention since December 2015. When she was evaluated by her local ER at [facility] a CT of the abdomen and pelvis was obtained on 07/28/2014. This revealed a large (26 a 12 cm) cystic mass with enhancing septations and heterogeneous cystic spaces and peripheral nodular soft tissue component. It  appeared to be arising from the left ovary. It occupied the entire mid pelvis. There was deviation of the otherwise normal appearing uterus. There was no ascites lymphadenopathy perineal carcinomatosis or omental cake identified. She was seen by Dr. Armandina [name] this further evaluated her with a CA-125 which was mildly elevated at 68. CEA was normal. Given the potential for underlying malignancy she was sent for my evaluation and consultation.  The patient is otherwise healthy woman. She denies abdominal pain, early satiety, or weight loss. Her only prior abdominal surgery is a tubal ligation. She has also undergone endometrial ablation. The family history is not concerning for increased risk for malignancy (her mother had dual cancers of vulva and lung but was a heavy smoker). She is neck for performance status. She is premenopausal and continues to have regular menses with no intermenstrual bleeding.  She has a history of an ASCUS pap 3 years ago with negative hr HPV testing at that time.  She underwent TAH, BSO lymphadenectomy, omentectomy with Dr Alyssa [name] at [facility] on 08/11/14. 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 The primary tumor was 25 cm, no LDS I, 0 of 16 nodes involved , the tumor was intestinal type with expansile patent and associated with a mucinous borderline tumor of the ovary.  She went on to receive 6 cycles of adjuvant carboplatin and paclitaxel chemotherapy (from 08/31/14 to 12/14/14).  CA 125 at completion of therapy in June, 2016 was 13.  CT chest/abdo/pelvis on 01/17/15 : Status post total abdominal hysterectomy and bilateral salpingo-oophorectomy. Previously noted large pelvis mass has been resected. However, today's study does demonstrate a 6.0 x 4.7 x 7.1 cm well-defined low-attenuation lesion along the left pelvic sidewall (image 71 of series 2), suspicious for local recurrence of disease. No other  definite cystic or solid pelvic mass is identified. No definite peritoneal nodularity.  Interval History: she feels well with no complaints and no ongoing symptoms of chemotherapy.  Current Meds:  Outpatient Encounter Prescriptions as of 01/27/2015  Medication Sig  . ibuprofen (ADVIL,MOTRIN) 600 MG tablet Take 600 mg by mouth every 6 (six) hours as needed.  Marland Kitchen LORazepam (ATIVAN) 0.5 MG tablet Place 1 tablet under the under the tongue or swallow  Every 6 hrs as needed for nausea.  Will make Drowsy.  . metoprolol succinate (TOPROL XL) 25 MG 24 hr tablet Take 1 tablet (25 mg total) by mouth daily.  Marland Kitchen omeprazole (PRILOSEC) 20 MG capsule Take 1 capsule (20 mg total) by mouth daily.  . potassium chloride (K-DUR) 10 MEQ tablet Take 2 tablets (20 mEq total) by mouth 2 (two) times daily.  . vitamin E 400 UNIT capsule Take 400 Units by mouth 2 (two) times daily.  Marland Kitchen dexamethasone (DECADRON) 4 MG tablet Take 5 tablets with food 12 hrs and 6 hrs prior to Taxol Chemotherapy. (Patient not taking: Reported on 12/09/2014)  . docusate sodium (COLACE) 100 MG capsule Take 100 mg by mouth 2 (two) times daily as needed.   . fluconazole (DIFLUCAN) 100 MG tablet Take 1 tablet (100 mg total) by mouth daily. As directed for thrush around chemo (Patient not taking: Reported on 11/09/2014)  . ondansetron (ZOFRAN) 4 MG tablet Take 4 mg by mouth every 8 (eight) hours as needed for nausea or vomiting.  . polyethylene glycol (MIRALAX / GLYCOLAX) packet Take 17 g by mouth 2 (two) times daily as needed.   Facility-Administered Encounter Medications as of 01/27/2015  Medication  . sodium chloride 0.9 % 1,000 mL with potassium chloride 10 mEq infusion    Allergy:  Allergies  Allergen Reactions  . Codeine Itching  . Doxycycline     Per pt, she gets chest pain from doxycycline  . Penicillins Nausea And Vomiting    Social Hx:   History   Social History  . Marital Status: Married    Spouse Name: N/A  . Number of Children:  N/A  . Years of Education: N/A   Occupational History  . Not on file.   Social History Main Topics  . Smoking status: Never Smoker   . Smokeless tobacco: Not on file  . Alcohol Use: No  . Drug Use: No  . Sexual Activity: Yes   Other Topics Concern  . Not on file   Social History Narrative    Past Surgical Hx:  Past Surgical History  Procedure Laterality Date  . Tubal ligation  1991  . Endometrial ablation  2002    Past Medical Hx: History reviewed. No pertinent past medical history.  Past Gynecological History:  G3P3 (SVD's), tubal ligation. LMP in December 2015.  No LMP recorded. Patient has had a hysterectomy.  Family Hx:  Family History  Problem Relation Age of Onset  . Cancer Mother   . Stroke Father     Review of Systems:  Constitutional  Feels well,    ENT Normal appearing ears and nares bilaterally Skin/Breast  No rash, sores, jaundice, itching, dryness Cardiovascular  No chest pain,  shortness of breath, or edema  Pulmonary  No cough or wheeze.  Gastro Intestinal  No nausea, vomitting, or diarrhoea. No bright red blood per rectum, no abdominal pain, change in bowel movement, or constipation.  Genito Urinary  No frequency, urgency, dysuria, see HPI Musculo Skeletal  No myalgia, arthralgia, joint swelling or pain  Neurologic  No weakness, numbness, change in gait,  Psychology  No depression, anxiety, insomnia.   Vitals:  Blood pressure 132/95, pulse 105, temperature 98.6 F (37 C), temperature source Oral, resp. rate 18, height 5\' 2"  (1.575 m), weight 188 lb 11.2 oz (85.594 kg), SpO2 99 %.  Physical Exam: WD in NAD Neck  Supple NROM, without any enlargements.  Lymph Node Survey No cervical supraclavicular or inguinal adenopathy Cardiovascular  Pulse normal rate, regularity and rhythm. S1 and S2 normal.  Lungs  Clear to auscultation bilateraly, without wheezes/crackles/rhonchi. Good air movement.  Skin  No rash/lesions/breakdown   Psychiatry  Alert and oriented to person, place, and time  Abdomen  Normoactive bowel sounds, abdomen soft, non-tender and mildly overweight without evidence of hernia. No palpable masses Back No CVA tenderness Genito Urinary  Vulva/vagina: Normal external female genitalia.  No lesions. No discharge or bleeding.  Bladder/urethra:  No lesions or masses, well supported bladder  Vagina: regular with no lesions  Cervix: surgically absent  Uterus: surgically absent  Adnexa: no palpable masses Rectal  Good tone, no masses no cul de sac nodularity.  Extremities  No bilateral cyanosis, clubbing or edema.   Donaciano Eva, MD   01/27/2015, 4:05 PM

## 2015-01-27 NOTE — Telephone Encounter (Signed)
Called pt to inform her of the results concerning Potassium level(2.9L). I ask pt if she had been taking her  potassium as prescribed 20 mEq twice daily and she said she has. I consulted with Melissa Cross,NP and I told pt to increase Potassium to 20 mEq 3 times daily x 2 days then resume back to taking 20 mEq BID. I also suggested some higher potassium foods as well. Pt verbalized understanding and agreed to the plan. Message to be fwd to Dr. Marko Plume.

## 2015-01-28 ENCOUNTER — Encounter: Payer: Self-pay | Admitting: *Deleted

## 2015-01-28 ENCOUNTER — Encounter: Payer: Self-pay | Admitting: Genetic Counselor

## 2015-01-28 ENCOUNTER — Telehealth: Payer: Self-pay

## 2015-01-28 ENCOUNTER — Telehealth: Payer: Self-pay | Admitting: *Deleted

## 2015-01-28 DIAGNOSIS — C562 Malignant neoplasm of left ovary: Secondary | ICD-10-CM

## 2015-01-28 DIAGNOSIS — E876 Hypokalemia: Secondary | ICD-10-CM

## 2015-01-28 NOTE — Telephone Encounter (Signed)
Received call from patient that Dr. Denman George released her to return work but states she needs a note faxed to work. Letter signed by Joylene John, NP and faxed to [phone] (Ticket# [id number]).

## 2015-01-28 NOTE — Telephone Encounter (Signed)
-----   Message from Gordy Levan, MD sent at 01/27/2015  4:10 PM EDT ----- See nursing notes form 7-28 re K. Have her increase K to 40 mEq bid until either I see her or PCP sees her.   If she still does not have PCP, needs to find someone now. Note I do not think she has insurance yet  (I think Jillyn Ledger is close to [place]? If so, can call [facility] and ask who they suggest for PCP. I don't know if the [facility] that is the equivalent to [facility] would see [place] patient?)  Be sure she is taking K correctly - she has 10 mEq tablets. Likely will need refill Be sure no OTC meds that we don't know about Be sure no diarrhea  Note she should be off decadron and diflucan now - still on med list  thanks

## 2015-01-31 MED ORDER — POTASSIUM CHLORIDE ER 10 MEQ PO TBCR: EXTENDED_RELEASE_TABLET | ORAL | Status: DC

## 2015-01-31 NOTE — Telephone Encounter (Signed)
Reached [name] and discussed KCL.  Sent new prescription in for a month's supply. Alyssa [name] will f/u with PCP regarding potassium level and further  dosing from [facility] prior to seeing Dr. Marko Plume 02-28-15. She is not on any new medications OCT or prescription.   She has had a little diarrhea.  She fluctuates between constipation and diarrhea.  She has not been eating the best diet since finishing her treatments since food tastes better.

## 2015-01-31 NOTE — Addendum Note (Signed)
Addended by: Baruch Merl on: 01/31/2015 08:43 PM   Modules accepted: Orders

## 2015-01-31 NOTE — Telephone Encounter (Signed)
LM for Alyssa [name] stating that Dr. Marko Plume wants her to take 40 meq = (4) 10 meq tabs daily as noted below.   Need to discuss PCP and review patient's meds, especially OTC meds as noted below.

## 2015-02-01 ENCOUNTER — Telehealth: Payer: Self-pay | Admitting: *Deleted

## 2015-02-01 NOTE — Telephone Encounter (Signed)
PT. HAS TWO MEDICATIONS FOR BLOOD PRESSURE SITTING NEXT TO EACH OTHER. SHE IS NOT SURE IF SHE HAS BEEN TAKING THE ONE DR.LIVESAY LAST ORDERED FOR HER. THE MEDICATIONS ARE TOPROL XL AND TRIAMT/HCTZ. PT. IS TAKING TRIAMT/HCTZ 37.5/25 ONE TABLET DAILY. THE TRIAMT/HCTZ IS NOT ON THE PT.'S MEDICATION LIST BUT THE TOPROL XL IS ON THE PT.'S MEDICATION LIST. PT. GAVE HER RETURN CALL NUMBER AS [phone] WITH SOME DIFFICULTY. [place] PHONE NUMBER LISTED IS [phone].

## 2015-02-02 NOTE — Telephone Encounter (Addendum)
Reviewed patient's chart and noted that on 11-23-14 that this nurse discussed with her stopping the Maxzide 25 mg daily and begin Lopressor XL 25 mg daily LM in Ms. Bardales's home phone number listed in EMR [phone] mail regarding this change . Requested that she f/u with PCP at [facility] with lab and visit as discussed in phone note 01-31-15.

## 2015-02-04 NOTE — Telephone Encounter (Addendum)
Reached Alyssa [name] and she said that she stopped the Maxzide 25 mg yesterday and began the Toprol XL 25 mg. Today. She will continue on the KCL 40 meq bid.  She has an appointment with PCP 02-10-15.  She through out the Maxzide tablets.

## 2015-02-25 ENCOUNTER — Other Ambulatory Visit (HOSPITAL_BASED_OUTPATIENT_CLINIC_OR_DEPARTMENT_OTHER): Payer: Self-pay

## 2015-02-25 DIAGNOSIS — C562 Malignant neoplasm of left ovary: Secondary | ICD-10-CM

## 2015-02-25 LAB — COMPREHENSIVE METABOLIC PANEL (CC13)
ALBUMIN: 3.9 g/dL (ref 3.5–5.0)
ALK PHOS: 71 U/L (ref 40–150)
ALT: 21 U/L (ref 0–55)
AST: 24 U/L (ref 5–34)
Anion Gap: 9 mEq/L (ref 3–11)
BUN: 14 mg/dL (ref 7.0–26.0)
CALCIUM: 10.1 mg/dL (ref 8.4–10.4)
CO2: 28 mEq/L (ref 22–29)
Chloride: 105 mEq/L (ref 98–109)
Creatinine: 1.1 mg/dL (ref 0.6–1.1)
EGFR: 56 mL/min/{1.73_m2} — AB (ref >90)
GLUCOSE: 127 mg/dL (ref 70–140)
Potassium: 4.2 mEq/L (ref 3.5–5.1)
Sodium: 142 mEq/L (ref 136–145)
Total Bilirubin: 0.43 mg/dL (ref 0.20–1.20)
Total Protein: 7.3 g/dL (ref 6.4–8.3)

## 2015-02-25 LAB — CBC WITH DIFFERENTIAL/PLATELET
BASO%: 1.2 % (ref 0.0–2.0)
Basophils Absolute: 0 10*3/uL (ref 0.0–0.1)
EOS ABS: 0.2 10*3/uL (ref 0.0–0.5)
EOS%: 6.1 % (ref 0.0–7.0)
HEMATOCRIT: 38.2 % (ref 34.8–46.6)
HEMOGLOBIN: 12.9 g/dL (ref 11.6–15.9)
LYMPH%: 35.4 % (ref 14.0–49.7)
MCH: 33.2 pg (ref 25.1–34.0)
MCHC: 33.8 g/dL (ref 31.5–36.0)
MCV: 98.2 fL (ref 79.5–101.0)
MONO#: 0.2 10*3/uL (ref 0.1–0.9)
MONO%: 7.3 % (ref 0.0–14.0)
NEUT%: 50 % (ref 38.4–76.8)
NEUTROS ABS: 1.3 10*3/uL — AB (ref 1.5–6.5)
PLATELETS: 225 10*3/uL (ref 145–400)
RBC: 3.89 10*6/uL (ref 3.70–5.45)
RDW: 12.4 % (ref 11.2–14.5)
WBC: 2.6 10*3/uL — ABNORMAL LOW (ref 3.9–10.3)
lymph#: 0.9 10*3/uL (ref 0.9–3.3)

## 2015-02-26 LAB — CA 125: CA 125: 9 U/mL (ref <35)

## 2015-02-27 ENCOUNTER — Encounter: Payer: Self-pay | Admitting: Oncology

## 2015-02-27 ENCOUNTER — Other Ambulatory Visit: Payer: Self-pay | Admitting: Oncology

## 2015-02-27 NOTE — Progress Notes (Signed)
[facility] END OF TREATMENT   Name: Alyssa [name] Date: February 27, 2015  MRN: [id number] DOB: 22-Jan-1961   TREATMENT DATES: 08-31-14 thru 12-14-14   REFERRING PHYSICIAN: Suzan Slick  DIAGNOSIS: mucinous adenocarcinoma of left ovary  STAGE AT START OF TREATMENT: IC3   INTENT: curative   DRUGS OR REGIMENS GIVEN:carboplatin taxol every 3 weeks x 6   MAJOR TOXICITIES: taxol aches, neutropenia, constipation, nausea   REASON TREATMENT STOPPED: completion of planned course   PERFORMANCE STATUS AT END: 1   ONGOING PROBLEMS: mild peripheral neuropathy   FOLLOW UP PLANS: restaging CT and follow up with gyn oncology. Follow up medical oncology

## 2015-02-28 ENCOUNTER — Encounter: Payer: Self-pay | Admitting: Oncology

## 2015-02-28 ENCOUNTER — Ambulatory Visit (HOSPITAL_BASED_OUTPATIENT_CLINIC_OR_DEPARTMENT_OTHER): Payer: Self-pay | Admitting: Oncology

## 2015-02-28 ENCOUNTER — Ambulatory Visit (HOSPITAL_BASED_OUTPATIENT_CLINIC_OR_DEPARTMENT_OTHER): Payer: Self-pay | Admitting: Genetic Counselor

## 2015-02-28 ENCOUNTER — Encounter: Payer: Self-pay | Admitting: Genetic Counselor

## 2015-02-28 ENCOUNTER — Other Ambulatory Visit: Payer: Self-pay

## 2015-02-28 ENCOUNTER — Telehealth: Payer: Self-pay | Admitting: Oncology

## 2015-02-28 VITALS — BP 141/90 | HR 98 | Temp 98.2°F | Resp 18 | Ht 62.0 in | Wt 194.0 lb

## 2015-02-28 DIAGNOSIS — Z8042 Family history of malignant neoplasm of prostate: Secondary | ICD-10-CM

## 2015-02-28 DIAGNOSIS — G620 Drug-induced polyneuropathy: Secondary | ICD-10-CM

## 2015-02-28 DIAGNOSIS — Z801 Family history of malignant neoplasm of trachea, bronchus and lung: Secondary | ICD-10-CM

## 2015-02-28 DIAGNOSIS — T451X5A Adverse effect of antineoplastic and immunosuppressive drugs, initial encounter: Secondary | ICD-10-CM

## 2015-02-28 DIAGNOSIS — Z808 Family history of malignant neoplasm of other organs or systems: Secondary | ICD-10-CM

## 2015-02-28 DIAGNOSIS — C562 Malignant neoplasm of left ovary: Secondary | ICD-10-CM

## 2015-02-28 DIAGNOSIS — G622 Polyneuropathy due to other toxic agents: Secondary | ICD-10-CM

## 2015-02-28 DIAGNOSIS — Z315 Encounter for genetic counseling: Secondary | ICD-10-CM

## 2015-02-28 NOTE — Progress Notes (Signed)
OFFICE PROGRESS NOTE   February 28, 2015   Physicians:Emma Rossi/ [place] [facility] [place] (PCP, name not known)  INTERVAL HISTORY:  Patient is seen, alone for visit, continuing follow up of IC3 mucinous adenocarcinoma of left ovary, for which she completed 6 cycles of adjuvant carboplatin taxol on 12-14-14. She had CT AP 01-17-15 and saw Dr Denman George after that imaging, with findings thought most likely left pelvic side wall lymphocyst but with other history plan is to repeat the CT with visit to Dr Denman George again in Sept.   Patient has felt very well, back at work on usual schedule for last 2 weeks. 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 Appetite and energy are good, bowels moving regularly without medication, no abdominal or pelvic discomfort, no bleeding. No peripheral neuropathy symptoms now. No nausea, no SOB, no LE swelling.     No PAC Genetics counseling to be today Pre op CA125 was 47  Seen at PCP office in July in f/u of BP medication, continuing toprol.  ONCOLOGIC HISTORY Patient had been in usual good health with entirely regular menstrual periods until indigestion and difficulty eating ~ late Nov 2015, then abdominal fullness and distension by late Dec. The abdominal swelling progressed such that she was seen by PCP at [facility], then ED at [facility] on 07-28-14. CT was obtained at [facility], reportedly with 26 x 12 cm cystic mass with enhancing septations and peripheral nodular soft tissue component, apparently arising from left ovary and occupying entire mid pelvis with deviation of otherwise normal appearing uterus; no ascites, omental cake, peritoneal carcinomatosis or adenopathy per CT. She was seen by Dr Armandina Stammer, with CA 125 68 and CEA normal. She was seen in consultation by Dr Denman George on 08-05-14 and, due to surgical availability, sent to Dr Suzan Slick  at [facility]. Surgery on 08-11-14 was exploratory laparotomy with TAH, BSO and lymphadenectomy, R0 at completion although the ovary had ruptured preoperatively. Intraoperatively the appendix, bowel, nodes and upper abdomen were unremarkable. She was DC on post op day 2 on lovenox, which she continues. [facility] surgical pathology [phone] [id number]) from 08-11-14 found well differentiated mucinous adenocarcinoma of left ovary intestinal type with expansile pattern and associated mucinous borderline tumor also in the ovary. The primary tumor was 25 cm, no LVSI, 0/16 nodes involved appendix benign and pelvic washings positive (pT1c pN0; IC3). Case was presented at [facility] multidisciplinary conference, with consideration of FOLFOX vs carbo taxol; final recommendation was for [place] taxol. She had first carboplatin taxol on 08-31-14 and was neutropenic with ANC 0.1 on day 10, gCSF added. Cycle 6 carbo taxol given 12-14-14. CT AP 01-17-15 had 6 x 4.7 x 7.1 cm area at left pelvic sidewall possible lymphocyst.     Review of systems as above, also: No fever or symptoms of infection. Hair is growing back. Bladder fine. Sleeping well. Not exercising regularly. Occasional tingling in right hand seems positional. Remainder of 10 point Review of Systems negative.  Objective:  Vital signs in last 24 hours:  BP 141/90 mmHg  Pulse 98  Temp(Src) 98.2 F (36.8 C) (Oral)  Resp 18  Ht $R'5\' 2"'xk$  (1.575 m)  Wt 194 lb (87.998 kg)  BMI 35.47 kg/m2  SpO2 100% weight up 6 lbs.   Alert, oriented and appropriate. Easily ambulatory, looks comfortable, very pleasant as always.  Hair growing back.  HEENT:PERRL, sclerae not icteric. Oral mucosa moist without lesions,  posterior pharynx clear.  Neck supple. No JVD.  Lymphatics:no cervical,supraclavicular, axillary or inguinal adenopathy Resp: clear to auscultation bilaterally and normal percussion bilaterally Cardio: regular rate and rhythm. No gallop. GI: soft, nontender, not distended, no mass or  organomegaly. Normally active bowel sounds. Surgical incision not remarkable. Musculoskeletal/ Extremities: without pitting edema, cords, tenderness Neuro: no peripheral neuropathy. Otherwise nonfocal. PSYCH appropriate mood and affect Skin without rash, ecchymosis, petechiae Breasts: smooth area left ~10:00 reportedly known cyst, otherwise bilaterally without dominant mass, skin or nipple findings. Axillae benign.   Lab Results:  Results for orders placed or performed in visit on 02/25/15  CBC with Differential/Platelet  Result Value Ref Range   WBC 2.6 (L) 3.9 - 10.3 10e3/uL   NEUT# 1.3 (L) 1.5 - 6.5 10e3/uL   HGB 12.9 11.6 - 15.9 g/dL   HCT 38.2 34.8 - 46.6 %   Platelets 225 145 - 400 10e3/uL   MCV 98.2 79.5 - 101.0 fL   MCH 33.2 25.1 - 34.0 pg   MCHC 33.8 31.5 - 36.0 g/dL   RBC 3.89 3.70 - 5.45 10e6/uL   RDW 12.4 11.2 - 14.5 %   lymph# 0.9 0.9 - 3.3 10e3/uL   MONO# 0.2 0.1 - 0.9 10e3/uL   Eosinophils Absolute 0.2 0.0 - 0.5 10e3/uL   Basophils Absolute 0.0 0.0 - 0.1 10e3/uL   NEUT% 50.0 38.4 - 76.8 %   LYMPH% 35.4 14.0 - 49.7 %   MONO% 7.3 0.0 - 14.0 %   EOS% 6.1 0.0 - 7.0 %   BASO% 1.2 0.0 - 2.0 %  Comprehensive metabolic panel (Cmet) - [facility]  Result Value Ref Range   Sodium 142 136 - 145 mEq/L   Potassium 4.2 3.5 - 5.1 mEq/L   Chloride 105 98 - 109 mEq/L   CO2 28 22 - 29 mEq/L   Glucose 127 70 - 140 mg/dl   BUN 14.0 7.0 - 26.0 mg/dL   Creatinine 1.1 0.6 - 1.1 mg/dL   Total Bilirubin 0.43 0.20 - 1.20 mg/dL   Alkaline Phosphatase 71 40 - 150 U/L   AST 24 5 - 34 U/L   ALT 21 0 - 55 U/L   Total Protein 7.3 6.4 - 8.3 g/dL   Albumin 3.9 3.5 - 5.0 g/dL   Calcium 10.1 8.4 - 10.4 mg/dL   Anion Gap 9 3 - 11 mEq/L   EGFR 56 (L) >90 ml/min/1.73 m2  CA 125  Result Value Ref Range   CA 125 9 <35 U/mL     Studies/Results: EXAM: CT ABDOMEN AND PELVIS WITH CONTRAST  TECHNIQUE: Multidetector CT imaging of the abdomen and pelvis was performed using the standard protocol  following bolus administration of intravenous contrast.  CONTRAST: 121mL OMNIPAQUE IOHEXOL 300 MG/ML SOLN  COMPARISON: CT of the abdomen and pelvis 07/28/2014.  FINDINGS: Lower chest: Unremarkable.  Hepatobiliary: No cystic or solid hepatic lesions. No intra or extrahepatic biliary ductal dilatation. Large rim calcified gallstone measuring 3 cm in the gallbladder. No current findings to suggest an acute cholecystitis at this time.  Pancreas: No pancreatic mass. No pancreatic ductal dilatation. No pancreatic or peripancreatic fluid or inflammatory changes.  Spleen: Unremarkable.  Adrenals/Urinary Tract: Bilateral adrenal glands and bilateral kidneys are normal in appearance. No hydroureteronephrosis. Urinary bladder is nearly completely decompressed, but otherwise unremarkable in appearance.  Stomach/Bowel: Normal appearance of the stomach. No pathologic dilatation of small bowel or colon.  Vascular/Lymphatic: No significant atherosclerotic disease throughout the abdominal and pelvic vasculature. Filling defect in the  right gonadal vein immediately before the junction with the inferior vena cava, compatible with thrombus (image 44 of series 2). No lymphadenopathy noted in the abdomen or pelvis.  Reproductive: Status post total abdominal hysterectomy and bilateral salpingo-oophorectomy. Previously noted large pelvis mass has been resected. However, today's study does demonstrate a 6.0 x 4.7 x 7.1 cm well-defined low-attenuation lesion along the left pelvic sidewall (image 71 of series 2), suspicious for local recurrence of disease. No other definite cystic or solid pelvic mass is identified. No definite peritoneal nodularity.  Other: No significant volume of ascites. No pneumoperitoneum.  Musculoskeletal: There are no aggressive appearing lytic or blastic lesions noted in the visualized portions of the skeleton.  IMPRESSION: 1. Status post total  abdominal hysterectomy and bilateral salpingo-oophorectomy, with 6.0 x 4.7 x 7.1 cm simple appearing mass along the left pelvic sidewall, concerning for local recurrence of disease. The possibility of a benign lesions such as a peritoneal inclusion cyst could be considered, but is not favored in light of the patient's history of ovarian malignancy. 2. 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 3. Cholelithiasis without evidence of acute cholecystitis at this time. 4. Additional incidental findings, as above.     EXAM: DIGITAL SCREENING BILATERAL MAMMOGRAM WITH CAD  01-17-15  COMPARISON: Previous exam(s).  ACR Breast Density Category b: There are scattered areas of fibroglandular density.  FINDINGS: There are no findings suspicious for malignancy. Images were processed with CAD.  IMPRESSION: No mammographic evidence of malignancy. A result letter of this screening mammogram will be mailed directly to the patient.  RECOMMENDATION: Screening mammogram in one year. (Code:SM-B-01Y)  BI-RADS CATEGORY 1: Negative.    Medications: I have reviewed the patient's current medications. Has been on K 10 mEq daily for ~ past month, stop K now as no longer on diuretic with BP med. OK to use OTC ibuprofen prn with food.   DISCUSSION: labs reviewed, including CA 125. Keep appointment with Dr Denman George following repeat CT late Sept as scheduled.  Encouraged resuming regular exercise, had been doing treadmill prior to cancer diagnosis and will resume this  Assessment/Plan:   1.IC3 mucinous adenocarcinoma of left ovary: preoperative rupture, surgery by gyn oncology at [facility] 08-11-14 (R0), and adjuvant carbo taxol x 6 cycles from 3-1-1 thru 12-14-14, with granix support. Likely lymphocyst in left pelvis, for follow up CT and to see Dr Denman George again Sept . Genetics counseling appointment later today. Needs regular exercise  and weight loss to ideal. Not eligible for GOG 0225 as stage 1 disease. 2.surgically induced menopause 3.hypokalemia resolved since off diuretic. Stop supplemental potassium 4.HTN controlled with Toprol XL 25 mg. 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  8.No colonoscopy, which we will try to address   All questions answered.  As long as she is doing well, I will see her with labs early Dec. Time spent 15 min including >50% counseling and coordination of care.    Zemirah Krasinski P, MD   02/28/2015, 9:56 AM

## 2015-02-28 NOTE — Telephone Encounter (Signed)
Gave avs & calendar for December. °

## 2015-02-28 NOTE — Progress Notes (Signed)
REFERRING Aboubacar Matsuo: Evlyn Clines, MD  PRIMARY Celina Shiley:  No PCP Per Patient  PRIMARY REASON FOR VISIT:  1. Carcinoma of left ovary   2. Family history of prostate cancer      HISTORY OF PRESENT ILLNESS:   Ms. Liska, a 54 y.o. female, was seen for a [place] cancer genetics consultation at the request of Dr. Marko Plume due to a personal and family history of cancer.  Ms. Musolf presents to clinic today to discuss the possibility of a hereditary predisposition to cancer, genetic testing, and to further clarify her future cancer risks, as well as potential cancer risks for family members.   In February 2016, at the age of 55, Ms. Ibe was diagnosed with carcinoma of the left ovary.  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 This was treated with surgery and six rounds of chemotherapy.    CANCER HISTORY:   No history exists.     HORMONAL RISK FACTORS:  Menarche was at age 23.  First live birth at age 35.  OCP use for approximately 6 years.  Ovaries intact: no.  Hysterectomy: yes.  Menopausal status: postmenopausal.  HRT use: 0 years. Colonoscopy: no; not examined. Mammogram within the last year: yes. Number of breast biopsies: 0. Up to date with pelvic exams:  yes. Any excessive radiation exposure in the past:  no  Past Medical History  Diagnosis Date  . Ovarian cancer 08/2014    mucinous ovarian cancer    Past Surgical History  Procedure Laterality Date  . Tubal ligation  1991  . Endometrial ablation  2002  . Abdominal hysterectomy      Social History   Social History  . Marital Status: Married    Spouse Name: N/A  . Number of Children: 3  . Years of Education: N/A   Social History Main Topics  . Smoking status: Never Smoker   . Smokeless tobacco: None  . Alcohol Use: No  . Drug Use: No  . Sexual Activity: Yes   Other Topics Concern  . None   Social History Narrative     FAMILY HISTORY:  We obtained a detailed, 4-generation family history.   Significant diagnoses are listed below: Family History  Problem Relation Age of Onset  . Cancer Mother 43    vulvar cancer  . Lung cancer Mother 41  . Stroke Father   . Prostate cancer Maternal Uncle   . Stroke Maternal Grandmother   . Prostate cancer Maternal Grandfather   . Cervical cancer Paternal Grandmother   . Throat cancer Paternal Grandfather   . Prostate cancer Maternal Uncle    The patient has three children and one brother and two sisters.  None have cancer.  One brother and one sister committed suicide.  Her mother was diagnosed with vulvar cancer at 62 and lung cancer at 34.  Her mother ahd seven full siblings (four brothers and three sisters) and three paternal half siblings (one brother and two sisters).  Two full brothers were diagnosed with prostate cancer.  Ms. Posada maternal grandfather was diagnosed with prostate cancer.  Her father died of a stroke, and her father's sister died at 84 weeks of age.  Her paternal grandparents died of cancer - grandmother with cervical cancer and grandfather with throat cancer.  Patient's ancestors are of Caucasian descent. There is no reported Ashkenazi Jewish ancestry. There is no known consanguinity.  GENETIC COUNSELING ASSESSMENT: Jahzaria Vary is a 54 y.o. female with a personal history of ovarian cancer and family  history of prostate cancer which somewhat suggestive of a hereditary cancer syndrome and predisposition to cancer. We, therefore, discussed and recommended the following at today's visit.   DISCUSSION: Based on her personal history of ovarian cancer and family history of prostate cancer, we discussed BRCA mutations.  We reviewed the characteristics, features and inheritance patterns of hereditary cancer syndromes. We also discussed genetic testing, including the appropriate family members to test, the process of testing, insurance coverage and turn-around-time for results. We discussed the implications of a negative, positive  and/or variant of uncertain significant result. We recommended Ms. Brandner pursue genetic testing for the a gene panel that identifies genes associated with ovarian cancer.   Ms. Holtry does not have insurance, so we discussed different testing options through [facility]'s financial assistance program and self pay through [employer].  She does not meet the financial criteria for [facility], and the out of pocket cost through [employer] (currently $475) is too much right now. She deferred testing until a future date.  PLAN: Ms. Nahm will keep in contact with [place] and pursue genetic testing in the future, possibly in the beginning of the year. Results should be available within approximately 2-3 weeks' time, at which point they will be disclosed by telephone to Ms. Lonsway, as will any additional recommendations warranted by these results. Ms. Dail will receive a summary of her genetic counseling visit and a copy of her results once available. This information will also be available in Epic. We encouraged Ms. Sollers to remain in contact with cancer genetics annually so that we can continuously update the family history and inform her of any changes in cancer genetics and testing that may be of benefit for her family. Ms. Moulin questions were answered to her satisfaction today. Our contact information was provided should additional questions or concerns arise.  Lastly, we encouraged Ms. Meloni to remain in contact with cancer genetics annually so that we can continuously update the family history and inform her of any changes in cancer genetics and testing that may be of benefit for this family.   Ms.  Knight questions were answered to her satisfaction today. Our contact information was provided should additional questions or concerns arise. Thank you for the referral and allowing [place] to share in the care of your patient.   Karen P. Florene Glen, [place], [facility] Certified Genetic Counselor [email or web address] phone:  [phone]  The patient was seen for a total of 30 minutes in face-to-face genetic counseling.  This patient was discussed with Drs. Magrinat, Lindi Adie and/or Burr Medico who agrees with the above.    _______________________________________________________________________ For Office Staff:  Number of people involved in session: 1 Was an Intern/ student involved with case: no

## 2015-02-28 NOTE — Progress Notes (Signed)
Per [employer] neupogen to be shipped 03/01/15. I called and verified all info with our facility info [phone]

## 2015-03-22 ENCOUNTER — Ambulatory Visit (HOSPITAL_COMMUNITY)
Admission: RE | Admit: 2015-03-22 | Discharge: 2015-03-22 | Disposition: A | Discharge status: Home / Self Care | Payer: Self-pay | Source: Ambulatory Visit | Attending: Gynecologic Oncology | Admitting: Gynecologic Oncology

## 2015-03-22 ENCOUNTER — Encounter (HOSPITAL_COMMUNITY): Payer: Self-pay

## 2015-03-22 DIAGNOSIS — Z90710 Acquired absence of both cervix and uterus: Secondary | ICD-10-CM | POA: Insufficient documentation

## 2015-03-22 DIAGNOSIS — Z90722 Acquired absence of ovaries, bilateral: Secondary | ICD-10-CM | POA: Insufficient documentation

## 2015-03-22 DIAGNOSIS — Z9221 Personal history of antineoplastic chemotherapy: Secondary | ICD-10-CM | POA: Insufficient documentation

## 2015-03-22 DIAGNOSIS — C562 Malignant neoplasm of left ovary: Secondary | ICD-10-CM | POA: Insufficient documentation

## 2015-03-22 DIAGNOSIS — I82890 Acute embolism and thrombosis of other specified veins: Secondary | ICD-10-CM | POA: Insufficient documentation

## 2015-03-22 DIAGNOSIS — K8020 Calculus of gallbladder without cholecystitis without obstruction: Secondary | ICD-10-CM | POA: Insufficient documentation

## 2015-03-22 DIAGNOSIS — R1900 Intra-abdominal and pelvic swelling, mass and lump, unspecified site: Secondary | ICD-10-CM | POA: Insufficient documentation

## 2015-03-22 MED ORDER — IOHEXOL 300 MG/ML  SOLN
100.0000 mL | Freq: Once | INTRAMUSCULAR | Status: AC | PRN
  Administered 2015-03-22: 100 mL via INTRAVENOUS

## 2015-03-23 ENCOUNTER — Telehealth: Payer: Self-pay | Admitting: Gynecologic Oncology

## 2015-03-23 NOTE — Telephone Encounter (Signed)
Attempted to call patient with CT scan results.  Message left asking pt to call the office or if not the scan results would be discussed at her upcoming appt on Friday.

## 2015-03-25 ENCOUNTER — Encounter: Payer: Self-pay | Admitting: Gynecologic Oncology

## 2015-03-25 ENCOUNTER — Ambulatory Visit: Payer: Self-pay | Attending: Gynecologic Oncology | Admitting: Gynecologic Oncology

## 2015-03-25 ENCOUNTER — Ambulatory Visit: Payer: Self-pay | Admitting: Gynecologic Oncology

## 2015-03-25 VITALS — BP 128/94 | HR 76 | Temp 98.2°F | Resp 18 | Ht 62.0 in | Wt 190.6 lb

## 2015-03-25 DIAGNOSIS — C562 Malignant neoplasm of left ovary: Secondary | ICD-10-CM

## 2015-03-25 NOTE — Patient Instructions (Signed)
Follow up in March 2017 or sooner if needed.  Please call us closer to the date to schedule.  Please call for any questions or concerns.

## 2015-03-25 NOTE — Progress Notes (Signed)
Followup Note: Gyn-Onc   CC:  Chief Complaint  Patient presents with  . carcinoma of left ovary    follow up    Assessment/Plan:  Ms. Dezarai Prew  is a 54 y.o.  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   1/ left pelvic mass on CT imaging:This appears most consistent with a lymphocyst. It is smooth and regular in appearnce and cystic in appearance. Additioanlly, her CA 125 is normal. It is stable/decreasing on followup imaging. No additional surveillance is necessary.   2/ Followup: Dr Marko Plume in 2 month, and with me in 5 months.  3/ She was counseled regarding symptoms of recurrence and to notify us sooner if these develop  HPI: Abigaelle is a 54 year old G3 P3 who is seen in consultation at the request of Dr. Marin Roberts for a 26 cm pelvic mass and elevated CA-125. The patient began noting vague abdominal fullness and distention since December 2015. When she was evaluated by her local ER at [facility] a CT of the abdomen and pelvis was obtained on 07/28/2014. This revealed a large (26 a 12 cm) cystic mass with enhancing septations and heterogeneous cystic spaces and peripheral nodular soft tissue component. It appeared to be arising from the left ovary. It occupied the entire mid pelvis. There was deviation of the otherwise normal appearing uterus. There was no ascites lymphadenopathy perineal carcinomatosis or omental cake identified. She was seen by Dr. Armandina Stammer this further evaluated her with a CA-125 which was mildly elevated at 68. CEA was normal. Given the potential for underlying malignancy she was sent for my evaluation and consultation.  The patient is otherwise healthy woman. She denies abdominal pain, early satiety, or weight loss. Her only prior abdominal surgery is a tubal ligation. She has also  undergone endometrial ablation. The family history is not concerning for increased risk for malignancy (her mother had dual cancers of vulva and lung but was a heavy smoker). She is neck for performance status. She is premenopausal and continues to have regular menses with no intermenstrual bleeding.  She has a history of an ASCUS pap 3 years ago with negative hr HPV testing at that time.  She underwent TAH, BSO lymphadenectomy, omentectomy with Dr Kaleen Mask at [facility] on 08/11/14. 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 The primary tumor was 25 cm, no LDS I, 0 of 16 nodes involved , the tumor was intestinal type with expansile patent and associated with a mucinous borderline tumor of the ovary.  She went on to receive 6 cycles of adjuvant carboplatin and paclitaxel chemotherapy (from 08/31/14 to 12/14/14).  CA 125 at completion of therapy in June, 2016 was 13.   CT chest/abdo/pelvis on 01/17/15 : Status post total abdominal hysterectomy and bilateral salpingo-oophorectomy. Previously noted large pelvis mass has been resected. However, today's study does demonstrate a 6.0 x 4.7 x 7.1 cm well-defined low-attenuation lesion along the left pelvic sidewall (image 71 of series 2), suspicious for local recurrence of disease. No other definite cystic or solid pelvic mass is identified. No definite peritoneal nodularity.   03/03/15 Followup CT: showed reduction in size of size of left pelvic lymphocyst.  Interval History: she feels well with no complaints and no ongoing symptoms of chemotherapy. No symptoms from lymphocyst compression.  Current  Meds:  Outpatient Encounter Prescriptions as of 03/25/2015  Medication Sig  . loratadine (CLARITIN) 10 MG tablet Take 10 mg by mouth daily.  . metoprolol succinate (TOPROL XL) 25 MG 24 hr tablet Take 1 tablet (25 mg total) by mouth daily.  Marland Kitchen omeprazole (PRILOSEC) 20 MG capsule Take 1 capsule  (20 mg total) by mouth daily.  . Thiamine Mononitrate (VITAMIN B1 PO) Take by mouth. With vitamin C  . vitamin E 400 UNIT capsule Take 400 Units by mouth 2 (two) times daily.  Marland Kitchen ibuprofen (ADVIL,MOTRIN) 600 MG tablet Take 600 mg by mouth every 6 (six) hours as needed.  . ondansetron (ZOFRAN) 4 MG tablet Take 4 mg by mouth every 8 (eight) hours as needed for nausea or vomiting.  . [DISCONTINUED] docusate sodium (COLACE) 100 MG capsule Take 100 mg by mouth 2 (two) times daily as needed.   . [DISCONTINUED] LORazepam (ATIVAN) 0.5 MG tablet Place 1 tablet under the under the tongue or swallow  Every 6 hrs as needed for nausea.  Will make Drowsy. (Patient not taking: Reported on 02/28/2015)  . [DISCONTINUED] polyethylene glycol (MIRALAX / GLYCOLAX) packet Take 17 g by mouth 2 (two) times daily as needed.  . [DISCONTINUED] potassium chloride (K-DUR) 10 MEQ tablet Take 40 meq bid or as diredcted (Patient not taking: Reported on 03/25/2015)   Facility-Administered Encounter Medications as of 03/25/2015  Medication  . sodium chloride 0.9 % 1,000 mL with potassium chloride 10 mEq infusion    Allergy:  Allergies  Allergen Reactions  . Codeine Itching  . Doxycycline     Per pt, she gets chest pain from doxycycline  . Penicillins Nausea And Vomiting    Social Hx:   Social History   Social History  . Marital Status: Married    Spouse Name: N/A  . Number of Children: 3  . Years of Education: N/A   Occupational History  . Not on file.   Social History Main Topics  . Smoking status: Never Smoker   . Smokeless tobacco: Not on file  . Alcohol Use: No  . Drug Use: No  . Sexual Activity: Yes   Other Topics Concern  . Not on file   Social History Narrative    Past Surgical Hx:  Past Surgical History  Procedure Laterality Date  . Tubal ligation  1991  . Endometrial ablation  2002  . Abdominal hysterectomy      Past Medical Hx:  Past Medical History  Diagnosis Date  . Ovarian cancer  08/2014    mucinous ovarian cancer    Past Gynecological History:  G3P3 (SVD's), tubal ligation. LMP in December 2015.  No LMP recorded. Patient has had a hysterectomy.  Family Hx:  Family History  Problem Relation Age of Onset  . Cancer Mother 29    vulvar cancer  . Lung cancer Mother 67  . Stroke Father   . Prostate cancer Maternal Uncle   . Stroke Maternal Grandmother   . Prostate cancer Maternal Grandfather   . Cervical cancer Paternal Grandmother   . Throat cancer Paternal Grandfather   . Prostate cancer Maternal Uncle     Review of Systems:  Constitutional  Feels well,    ENT Normal appearing ears and nares bilaterally Skin/Breast  No rash, sores, jaundice, itching, dryness Cardiovascular  No chest pain, shortness of breath, or edema  Pulmonary  No cough or wheeze.  Gastro Intestinal  No nausea, vomitting, or diarrhoea. No bright red blood per rectum, no abdominal  pain, change in bowel movement, or constipation.  Genito Urinary  No frequency, urgency, dysuria, see HPI Musculo Skeletal  No myalgia, arthralgia, joint swelling or pain  Neurologic  No weakness, numbness, change in gait,  Psychology  No depression, anxiety, insomnia.   Vitals:  Blood pressure 128/94, pulse 76, temperature 98.2 F (36.8 C), temperature source Oral, resp. rate 18, height 5\' 2"  (1.575 m), weight 190 lb 9.6 oz (86.456 kg), SpO2 100 %.  Physical Exam: WD in NAD Neck  Supple NROM, without any enlargements.  Lymph Node Survey No cervical supraclavicular or inguinal adenopathy Cardiovascular  Pulse normal rate, regularity and rhythm. S1 and S2 normal.  Lungs  Clear to auscultation bilateraly, without wheezes/crackles/rhonchi. Good air movement.  Skin  No rash/lesions/breakdown  Psychiatry  Alert and oriented to person, place, and time  Abdomen  Normoactive bowel sounds, abdomen soft, non-tender and mildly overweight without evidence of hernia. No palpable masses Back No CVA  tenderness Genito Urinary  Vulva/vagina: Normal external female genitalia.  No lesions. No discharge or bleeding.  Bladder/urethra:  No lesions or masses, well supported bladder  Vagina: regular with no lesions  Cervix: surgically absent  Uterus: surgically absent  Adnexa: palpable cystic mass in left pelvis Rectal  Good tone, no masses no cul de sac nodularity.  Extremities  No bilateral cyanosis, clubbing or edema.   Donaciano Eva, MD   03/25/2015, 1:12 PM

## 2015-04-08 ENCOUNTER — Encounter (HOSPITAL_COMMUNITY): Payer: Self-pay

## 2015-05-29 ENCOUNTER — Other Ambulatory Visit: Payer: Self-pay | Admitting: Oncology

## 2015-05-29 DIAGNOSIS — C562 Malignant neoplasm of left ovary: Secondary | ICD-10-CM

## 2015-06-02 ENCOUNTER — Encounter: Payer: Self-pay | Admitting: Oncology

## 2015-06-02 ENCOUNTER — Telehealth: Payer: Self-pay | Admitting: Oncology

## 2015-06-02 ENCOUNTER — Ambulatory Visit (HOSPITAL_BASED_OUTPATIENT_CLINIC_OR_DEPARTMENT_OTHER): Payer: Self-pay | Admitting: Oncology

## 2015-06-02 ENCOUNTER — Other Ambulatory Visit (HOSPITAL_BASED_OUTPATIENT_CLINIC_OR_DEPARTMENT_OTHER): Payer: Self-pay

## 2015-06-02 VITALS — BP 135/93 | HR 76 | Temp 98.2°F | Resp 18 | Ht 62.0 in | Wt 195.4 lb

## 2015-06-02 DIAGNOSIS — C562 Malignant neoplasm of left ovary: Secondary | ICD-10-CM

## 2015-06-02 DIAGNOSIS — E8940 Asymptomatic postprocedural ovarian failure: Secondary | ICD-10-CM

## 2015-06-02 DIAGNOSIS — E669 Obesity, unspecified: Secondary | ICD-10-CM

## 2015-06-02 DIAGNOSIS — I10 Essential (primary) hypertension: Secondary | ICD-10-CM

## 2015-06-02 DIAGNOSIS — C561 Malignant neoplasm of right ovary: Secondary | ICD-10-CM

## 2015-06-02 LAB — CBC WITH DIFFERENTIAL/PLATELET
BASO%: 1.3 % (ref 0.0–2.0)
Basophils Absolute: 0 10*3/uL (ref 0.0–0.1)
EOS%: 7 % (ref 0.0–7.0)
Eosinophils Absolute: 0.2 10*3/uL (ref 0.0–0.5)
HCT: 42.1 % (ref 34.8–46.6)
HGB: 13.9 g/dL (ref 11.6–15.9)
LYMPH%: 22.8 % (ref 14.0–49.7)
MCH: 30.3 pg (ref 25.1–34.0)
MCHC: 33 g/dL (ref 31.5–36.0)
MCV: 91.7 fL (ref 79.5–101.0)
MONO#: 0.2 10*3/uL (ref 0.1–0.9)
MONO%: 6.2 % (ref 0.0–14.0)
NEUT#: 2.2 10*3/uL (ref 1.5–6.5)
NEUT%: 62.7 % (ref 38.4–76.8)
Platelets: 213 10*3/uL (ref 145–400)
RBC: 4.59 10*6/uL (ref 3.70–5.45)
RDW: 13.1 % (ref 11.2–14.5)
WBC: 3.6 10*3/uL — ABNORMAL LOW (ref 3.9–10.3)
lymph#: 0.8 10*3/uL — ABNORMAL LOW (ref 0.9–3.3)

## 2015-06-02 LAB — COMPREHENSIVE METABOLIC PANEL (CC13)
ALT: 20 U/L (ref 0–55)
AST: 23 U/L (ref 5–34)
Albumin: 3.9 g/dL (ref 3.5–5.0)
Alkaline Phosphatase: 110 U/L (ref 40–150)
Anion Gap: 8 mEq/L (ref 3–11)
BUN: 27.1 mg/dL — AB (ref 7.0–26.0)
CHLORIDE: 105 meq/L (ref 98–109)
CO2: 26 meq/L (ref 22–29)
Calcium: 10 mg/dL (ref 8.4–10.4)
Creatinine: 1.1 mg/dL (ref 0.6–1.1)
EGFR: 56 mL/min/{1.73_m2} — ABNORMAL LOW (ref >90)
Glucose: 83 mg/dl (ref 70–140)
Potassium: 4.1 mEq/L (ref 3.5–5.1)
SODIUM: 138 meq/L (ref 136–145)
Total Bilirubin: <0.3 mg/dL (ref 0.20–1.20)
Total Protein: 7.7 g/dL (ref 6.4–8.3)

## 2015-06-02 LAB — CA 125: CA 125: 7 U/mL (ref <35)

## 2015-06-02 NOTE — Telephone Encounter (Signed)
Gave patient avs report and appointments for both LL in June and Dr. Denman George in March.

## 2015-06-02 NOTE — Progress Notes (Signed)
OFFICE PROGRESS NOTE   June 02, 2015   Physicians:Emma Rossi/ [facility] (PCP, name not known)  INTERVAL HISTORY:  Patient is seen, alone for visit, in scheduled follow up of IC3 mucinous adenocarcinoma of left ovary, on observation since completing adjuvant carboplatin taxol on 12-14-14. Last imaging was CT AP 03-22-15, with apparent lymphocyst in pelvis, and saw Dr Denman George 03-25-15. She is to see Dr Denman George again in 3 months from this appointment.  Patient has felt well, back at work full time and no complaints that seem referable to gyn cancer or that treatment. 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 She has not been watching diet and is not getting regular exercise; she and husband have both gained weight and are addressing this now (unfortunately she is NOT eligible for GOG 0225 due to < stage II). Bowels are moving regularly. She has no SOB or chest discomfort. She is not checking BP outside of MD office, this recommended before next follow up with PCP. No bleeding. No fever or symptoms of infection. No LE swelling. Remainder of 10 point Review of Systems negative.    No PAC Genetics counseling done, testing recommended, not done due to cost Pre op CA125 was 68 Flu vaccine done by PCP   ONCOLOGIC HISTORY Patient had been in usual good health with entirely regular menstrual periods until indigestion and difficulty eating ~ late Nov 2015, then abdominal fullness and distension by late Dec. The abdominal swelling progressed such that she was seen by PCP at [facility], then ED at [facility] on 07-28-14. CT was obtained at [facility], reportedly with 26 x 12 cm cystic mass with enhancing septations and peripheral nodular soft tissue component, apparently arising from left ovary and occupying entire mid pelvis with deviation of otherwise normal appearing uterus; no ascites, omental cake, peritoneal carcinomatosis  or adenopathy per CT. She was seen by Dr Armandina Stammer, with CA 125 68 and CEA normal. She was seen in consultation by Dr Denman George on 08-05-14 and, due to surgical availability, sent to Dr Suzan Slick at [facility]. Surgery on 08-11-14 was exploratory laparotomy with TAH, BSO and lymphadenectomy, R0 at completion although the ovary had ruptured preoperatively. Intraoperatively the appendix, bowel, nodes and upper abdomen were unremarkable. She was DC on post op day 2 on lovenox, which she continues. [facility] surgical pathology [id number] 1) from 08-11-14 found well differentiated mucinous adenocarcinoma of left ovary intestinal type with expansile pattern and associated mucinous borderline tumor also in the ovary. The primary tumor was 25 cm, no LVSI, 0/16 nodes involved appendix benign and pelvic washings positive (pT1c pN0; IC3). Case was presented at [facility] multidisciplinary conference, with consideration of FOLFOX vs carbo taxol; final recommendation was for [place] taxol. She had first carboplatin taxol on 08-31-14 and was neutropenic with ANC 0.1 on day 10, gCSF added. Cycle 6 carbo taxol given 12-14-14. CT AP 01-17-15 had 6 x 4.7 x 7.1 cm area at left pelvic sidewall possible lymphocyst, follow up CT 03-22-15 stable lymphocyst.   Objective:  Vital signs in last 24 hours:  BP 135/93 mmHg  Pulse 76  Temp(Src) 98.2 F (36.8 C) (Oral)  Resp 18  Ht 5\' 2"  (1.575 m)  Wt 195 lb 6.4 oz (88.633 kg)  BMI 35.73 kg/m2  SpO2 100% Weight up 5 more lbs Alert, oriented and appropriate. Ambulatory without difficulty.  No alopecia  HEENT:PERRL, sclerae not icteric. Oral mucosa moist without lesions, posterior pharynx clear.  Neck supple. No  JVD.  Lymphatics:no cervical,supraclavicular, axillary or inguinal adenopathy Resp: clear to auscultation bilaterally and normal percussion bilaterally Cardio: regular rate and rhythm. No gallop. GI: abdomen obese, soft, nontender, not distended, no mass or organomegaly. Normally active  bowel sounds. Surgical incision not remarkable. Musculoskeletal/ Extremities: without pitting edema, cords, tenderness Neuro: no change peripheral neuropathy. Otherwise nonfocal. PSYCH appropriate mood and affect Skin without rash, ecchymosis, petechiae Breasts: bilaterally without dominant mass, skin or nipple findings. Axillae benign.   Lab Results:  Results for orders placed or performed in visit on 06/02/15  CBC with Differential  Result Value Ref Range   WBC 3.6 (L) 3.9 - 10.3 10e3/uL   NEUT# 2.2 1.5 - 6.5 10e3/uL   HGB 13.9 11.6 - 15.9 g/dL   HCT 42.1 34.8 - 46.6 %   Platelets 213 145 - 400 10e3/uL   MCV 91.7 79.5 - 101.0 fL   MCH 30.3 25.1 - 34.0 pg   MCHC 33.0 31.5 - 36.0 g/dL   RBC 4.59 3.70 - 5.45 10e6/uL   RDW 13.1 11.2 - 14.5 %   lymph# 0.8 (L) 0.9 - 3.3 10e3/uL   MONO# 0.2 0.1 - 0.9 10e3/uL   Eosinophils Absolute 0.2 0.0 - 0.5 10e3/uL   Basophils Absolute 0.0 0.0 - 0.1 10e3/uL   NEUT% 62.7 38.4 - 76.8 %   LYMPH% 22.8 14.0 - 49.7 %   MONO% 6.2 0.0 - 14.0 %   EOS% 7.0 0.0 - 7.0 %   BASO% 1.3 0.0 - 2.0 %   CA 125 available after visit 7  Studies/Results:   CT AP 03-22-15 and mammograms 01-17-15 reviewed by MD   Medications: I have reviewed the patient's current medications. Not on diuretic now, continues Toprol XL 25 mg daily.  DISCUSSION: Discussed the lymphocyst seen on CT, asymptomatic.  Discussed progressive weight gain, eating lots of sweets, no exercise other than at work. Husband has also gained weight, so they plan to address this together. We will let her know results of CA 125  Needs to check BP and take log to upcoming PCP appointment. I have told her that the weight gain is not helping HTN.  She does not want referral for screening colonoscopy now.   Assessment/Plan:  1.IC3 mucinous adenocarcinoma of left ovary: preoperative rupture, surgery by gyn oncology at [facility] 08-11-14 (R0), and adjuvant carbo taxol x 6 cycles from 3-1-1 thru 12-14-14, with  granix support. 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  Genetics testing was recommended but not able to afford presently. She will see Dr Denman George in March and I will see her ~ June, or sooner if needed 2.Progressive weight gain: Needs to address diet, regular exercise and weight loss to ideal. Not eligible for GOG 0225 as stage 1 disease. 3.hypokalemia resolved since off diuretic. She needed supplemental potassium when on the diuretic 4.HTN being addressed by PCP, presently on Toprol XL 25 mg. She should check BP and take log prior to return visit to PCP in Jan. Weight loss to ideal, increase exercise. 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  8.No colonoscopy, which she declines again today. 9.surgical menopause 10.obesity, BMI 35, as above   All questions answered. Time spent 20 min including >50% counseling and coordination of care. I will try to get this note to her PCP at [facility] (name not clear)   Ioma Chismar P, MD   06/02/2015, 8:28 AM

## 2015-06-03 ENCOUNTER — Encounter: Payer: Self-pay | Admitting: Oncology

## 2015-06-03 ENCOUNTER — Telehealth: Payer: Self-pay

## 2015-06-03 DIAGNOSIS — E669 Obesity, unspecified: Secondary | ICD-10-CM | POA: Insufficient documentation

## 2015-06-03 NOTE — Telephone Encounter (Signed)
Told Alyssa [name] the results of the CA-125 as noted below by  Dr. Marko Plume.

## 2015-06-03 NOTE — Progress Notes (Signed)
Medical Oncology  PCP Dr Wallene Dales, [place].Marko Plume, MD

## 2015-06-03 NOTE — Telephone Encounter (Signed)
-----   Message from Gordy Levan, MD sent at 06/02/2015  8:59 PM EST ----- Labs seen and need follow up: please let her know ca125 still in good low range at 7

## 2015-09-01 ENCOUNTER — Other Ambulatory Visit: Payer: Self-pay

## 2015-09-01 ENCOUNTER — Ambulatory Visit: Payer: Self-pay | Admitting: Oncology

## 2015-09-15 ENCOUNTER — Other Ambulatory Visit: Payer: Self-pay | Admitting: Oncology

## 2015-09-15 DIAGNOSIS — K219 Gastro-esophageal reflux disease without esophagitis: Secondary | ICD-10-CM | POA: Insufficient documentation

## 2015-09-15 DIAGNOSIS — C562 Malignant neoplasm of left ovary: Secondary | ICD-10-CM

## 2015-09-16 ENCOUNTER — Ambulatory Visit: Payer: Self-pay | Admitting: Gynecologic Oncology

## 2015-09-16 ENCOUNTER — Other Ambulatory Visit: Payer: Self-pay

## 2015-09-19 ENCOUNTER — Other Ambulatory Visit (HOSPITAL_BASED_OUTPATIENT_CLINIC_OR_DEPARTMENT_OTHER): Payer: BLUE CROSS/BLUE SHIELD

## 2015-09-19 ENCOUNTER — Ambulatory Visit: Payer: BLUE CROSS/BLUE SHIELD | Attending: Gynecologic Oncology | Admitting: Gynecologic Oncology

## 2015-09-19 ENCOUNTER — Encounter: Payer: Self-pay | Admitting: Gynecologic Oncology

## 2015-09-19 VITALS — BP 136/84 | HR 75 | Temp 98.5°F | Resp 19 | Wt 194.4 lb

## 2015-09-19 DIAGNOSIS — R1909 Other intra-abdominal and pelvic swelling, mass and lump: Secondary | ICD-10-CM

## 2015-09-19 DIAGNOSIS — Z90722 Acquired absence of ovaries, bilateral: Secondary | ICD-10-CM | POA: Diagnosis not present

## 2015-09-19 DIAGNOSIS — Z880 Allergy status to penicillin: Secondary | ICD-10-CM | POA: Diagnosis not present

## 2015-09-19 DIAGNOSIS — Z9221 Personal history of antineoplastic chemotherapy: Secondary | ICD-10-CM | POA: Diagnosis not present

## 2015-09-19 DIAGNOSIS — C569 Malignant neoplasm of unspecified ovary: Secondary | ICD-10-CM | POA: Diagnosis present

## 2015-09-19 DIAGNOSIS — Z9079 Acquired absence of other genital organ(s): Secondary | ICD-10-CM | POA: Diagnosis not present

## 2015-09-19 DIAGNOSIS — Z90710 Acquired absence of both cervix and uterus: Secondary | ICD-10-CM | POA: Diagnosis not present

## 2015-09-19 DIAGNOSIS — C562 Malignant neoplasm of left ovary: Secondary | ICD-10-CM

## 2015-09-19 DIAGNOSIS — Z8543 Personal history of malignant neoplasm of ovary: Secondary | ICD-10-CM

## 2015-09-20 ENCOUNTER — Telehealth: Payer: Self-pay | Admitting: Gynecologic Oncology

## 2015-09-20 ENCOUNTER — Encounter: Payer: Self-pay | Admitting: Gynecologic Oncology

## 2015-09-20 LAB — CANCER ANTIGEN 125 (PARALLEL TESTING): CA 125: 7 U/mL (ref <35)

## 2015-09-20 LAB — CA 125: Cancer Antigen (CA) 125: 11.2 U/mL (ref 0.0–38.1)

## 2015-09-20 NOTE — Progress Notes (Signed)
Followup Note: Gyn-Onc   CC:  Chief Complaint  Patient presents with  . Ovarian Cancer    follow-up    Assessment/Plan:  [name]  is a 55 y.o.  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 No evidence of recurrence on exam today.   1/ left pelvic mass on CT imaging:This appears most consistent with a lymphocyst. It is smooth and regular in appearnce and cystic in appearance. It is stable/decreasing on followup imaging. No additional surveillance is necessary.   2/ Followup: Dr Marko Plume in 3 month, and with me in 6 months.  3/ She was counseled regarding symptoms of recurrence and to notify us sooner if these develop.  4/ CA 125 pending.  HPI: Alyssa [name] is a 55 year old G3 P3 who is seen in consultation at the request of Dr. Marin Roberts for a 26 cm pelvic mass and elevated CA-125. The patient began noting vague abdominal fullness and distention since December 2015. When she was evaluated by her local ER at [facility] a CT of the abdomen and pelvis was obtained on 07/28/2014. This revealed a large (26 a 12 cm) cystic mass with enhancing septations and heterogeneous cystic spaces and peripheral nodular soft tissue component. It appeared to be arising from the left ovary. It occupied the entire mid pelvis. There was deviation of the otherwise normal appearing uterus. There was no ascites lymphadenopathy perineal carcinomatosis or omental cake identified. She was seen by Dr. Armandina Stammer this further evaluated her with a CA-125 which was mildly elevated at 68. CEA was normal. Given the potential for underlying malignancy she was sent for my evaluation and consultation.  The patient is otherwise healthy woman. She denies abdominal pain, early satiety, or weight loss. Her only prior abdominal surgery is a tubal  ligation. She has also undergone endometrial ablation. The family history is not concerning for increased risk for malignancy (her mother had dual cancers of vulva and lung but was a heavy smoker). She is neck for performance status. She is premenopausal and continues to have regular menses with no intermenstrual bleeding.  She has a history of an ASCUS pap 3 years ago with negative hr HPV testing at that time.  She underwent TAH, BSO lymphadenectomy, omentectomy with Dr Kaleen Mask at [facility] on 08/11/14. 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 The primary tumor was 25 cm, no LDS I, 0 of 16 nodes involved , the tumor was intestinal type with expansile patent and associated with a mucinous borderline tumor of the ovary.  She went on to receive 6 cycles of adjuvant carboplatin and paclitaxel chemotherapy (from 08/31/14 to 12/14/14).  CA 125 at completion of therapy in June, 2016 was 13.   CT chest/abdo/pelvis on 01/17/15 : Status post total abdominal hysterectomy and bilateral salpingo-oophorectomy. Previously noted large pelvis mass has been resected. However, today's study does demonstrate a 6.0 x 4.7 x 7.1 cm well-defined low-attenuation lesion along the left pelvic sidewall (image 71 of series 2), suspicious for local recurrence of disease. No other definite cystic or solid pelvic mass is identified. No definite peritoneal nodularity.   03/03/15 Followup CT: showed reduction in size of size of left pelvic lymphocyst.  Interval History: she feels well with no complaints and no ongoing symptoms of chemotherapy. No symptoms from lymphocyst  compression.  Current Meds:  Outpatient Encounter Prescriptions as of 09/19/2015  Medication Sig  . albuterol (PROAIR HFA) 108 (90 Base) MCG/ACT inhaler Inhale into the lungs.  Marland Kitchen azithromycin (ZITHROMAX) 250 MG tablet   . ibuprofen (ADVIL,MOTRIN) 600 MG tablet Take 600 mg by mouth every 6  (six) hours as needed.  . loratadine (CLARITIN) 10 MG tablet Take 10 mg by mouth daily.  . metoprolol succinate (TOPROL XL) 25 MG 24 hr tablet Take 1 tablet (25 mg total) by mouth daily.  Marland Kitchen omeprazole (PRILOSEC) 20 MG capsule Take 1 capsule (20 mg total) by mouth daily.  . Thiamine Mononitrate (VITAMIN B1 PO) Take by mouth. With vitamin C  . vitamin E 400 UNIT capsule Take 400 Units by mouth 2 (two) times daily.   Facility-Administered Encounter Medications as of 09/19/2015  Medication  . sodium chloride 0.9 % 1,000 mL with potassium chloride 10 mEq infusion    Allergy:  Allergies  Allergen Reactions  . Codeine Itching  . Doxycycline     Per pt, she gets chest pain from doxycycline  . Penicillins Nausea And Vomiting    Social Hx:   Social History   Social History  . Marital Status: Married    Spouse Name: N/A  . Number of Children: 3  . Years of Education: N/A   Occupational History  . Not on file.   Social History Main Topics  . Smoking status: Never Smoker   . Smokeless tobacco: Not on file  . Alcohol Use: No  . Drug Use: No  . Sexual Activity: Yes   Other Topics Concern  . Not on file   Social History Narrative    Past Surgical Hx:  Past Surgical History  Procedure Laterality Date  . Tubal ligation  1991  . Endometrial ablation  2002  . Abdominal hysterectomy      Past Medical Hx:  Past Medical History  Diagnosis Date  . Ovarian cancer ([place]) 08/2014    mucinous ovarian cancer    Past Gynecological History:  G3P3 (SVD's), tubal ligation. LMP in December 2015.  No LMP recorded. Patient has had a hysterectomy.  Family Hx:  Family History  Problem Relation Age of Onset  . Cancer Mother 53    vulvar cancer  . Lung cancer Mother 42  . Stroke Father   . Prostate cancer Maternal Uncle   . Stroke Maternal Grandmother   . Prostate cancer Maternal Grandfather   . Cervical cancer Paternal Grandmother   . Throat cancer Paternal Grandfather   . Prostate  cancer Maternal Uncle     Review of Systems:  Constitutional  Feels well,    ENT Normal appearing ears and nares bilaterally Skin/Breast  No rash, sores, jaundice, itching, dryness Cardiovascular  No chest pain, shortness of breath, or edema  Pulmonary  No cough or wheeze.  Gastro Intestinal  No nausea, vomitting, or diarrhoea. No bright red blood per rectum, no abdominal pain, change in bowel movement, or constipation.  Genito Urinary  No frequency, urgency, dysuria, see HPI Musculo Skeletal  No myalgia, arthralgia, joint swelling or pain  Neurologic  No weakness, numbness, change in gait,  Psychology  No depression, anxiety, insomnia.   Vitals:  Blood pressure 136/84, pulse 75, temperature 98.5 F (36.9 C), temperature source Oral, resp. rate 19, weight 194 lb 6.4 oz (88.179 kg), SpO2 98 %.  Physical Exam: WD in NAD Neck  Supple NROM, without any enlargements.  Lymph Node Survey No cervical supraclavicular or inguinal  adenopathy Cardiovascular  Pulse normal rate, regularity and rhythm. S1 and S2 normal.  Lungs  Clear to auscultation bilateraly, without wheezes/crackles/rhonchi. Good air movement.  Skin  No rash/lesions/breakdown  Psychiatry  Alert and oriented to person, place, and time  Abdomen  Normoactive bowel sounds, abdomen soft, non-tender and mildly overweight without evidence of hernia. No palpable masses Back No CVA tenderness Genito Urinary  Vulva/vagina: Normal external female genitalia.  No lesions. No discharge or bleeding.  Bladder/urethra:  No lesions or masses, well supported bladder  Vagina: regular with no lesions  Cervix: surgically absent  Uterus: surgically absent  Adnexa: palpable cystic mass in left pelvis Rectal  Good tone, no masses no cul de sac nodularity.  Extremities  No bilateral cyanosis, clubbing or edema.   Donaciano Eva, MD   09/20/2015, 7:41 AM

## 2015-09-20 NOTE — Telephone Encounter (Signed)
Patient called wanting CA 125 results.  Informed of results.  No concerns voiced.  Advised to call for any needs or concerns.

## 2015-11-22 ENCOUNTER — Telehealth: Payer: Self-pay

## 2015-11-22 NOTE — Telephone Encounter (Signed)
-----   Message from Gordy Levan, MD sent at 11/21/2015  1:54 PM EDT ----- Please let patient know that she should try decreasing omeprazole/ prilosec to every other day for 2 weeks, then DC if GERD still doing well. After DC omeprazole, she can use OTC pepcid/ famotidine 20 mg daily prn for GERD.  If GERD not controlled with these changes, we may need to get PCP to help  thanks

## 2015-11-22 NOTE — Telephone Encounter (Signed)
Discussed the tapering of prilosec as noted below by Dr. Marko Plume.  Alyssa [name] verbalized understading and wrote directions down.  She will see Dr. Marko Plume on 12-01-15.

## 2015-11-28 ENCOUNTER — Other Ambulatory Visit: Payer: Self-pay | Admitting: Oncology

## 2015-11-28 DIAGNOSIS — Z1239 Encounter for other screening for malignant neoplasm of breast: Secondary | ICD-10-CM

## 2015-12-01 ENCOUNTER — Telehealth: Payer: Self-pay | Admitting: Oncology

## 2015-12-01 ENCOUNTER — Other Ambulatory Visit (HOSPITAL_BASED_OUTPATIENT_CLINIC_OR_DEPARTMENT_OTHER): Payer: BLUE CROSS/BLUE SHIELD

## 2015-12-01 ENCOUNTER — Ambulatory Visit: Payer: Self-pay | Admitting: Genetic Counselor

## 2015-12-01 ENCOUNTER — Ambulatory Visit (HOSPITAL_BASED_OUTPATIENT_CLINIC_OR_DEPARTMENT_OTHER): Payer: BLUE CROSS/BLUE SHIELD | Admitting: Oncology

## 2015-12-01 ENCOUNTER — Other Ambulatory Visit: Payer: Self-pay | Admitting: Oncology

## 2015-12-01 ENCOUNTER — Other Ambulatory Visit: Payer: BLUE CROSS/BLUE SHIELD

## 2015-12-01 ENCOUNTER — Encounter: Payer: Self-pay | Admitting: Oncology

## 2015-12-01 VITALS — BP 126/95 | HR 74 | Temp 98.6°F | Resp 18 | Ht 62.0 in | Wt 195.7 lb

## 2015-12-01 DIAGNOSIS — C562 Malignant neoplasm of left ovary: Secondary | ICD-10-CM

## 2015-12-01 DIAGNOSIS — E876 Hypokalemia: Secondary | ICD-10-CM | POA: Diagnosis not present

## 2015-12-01 DIAGNOSIS — I10 Essential (primary) hypertension: Secondary | ICD-10-CM | POA: Diagnosis not present

## 2015-12-01 DIAGNOSIS — E669 Obesity, unspecified: Secondary | ICD-10-CM

## 2015-12-01 DIAGNOSIS — Z1231 Encounter for screening mammogram for malignant neoplasm of breast: Secondary | ICD-10-CM

## 2015-12-01 DIAGNOSIS — E8940 Asymptomatic postprocedural ovarian failure: Secondary | ICD-10-CM

## 2015-12-01 DIAGNOSIS — R52 Pain, unspecified: Secondary | ICD-10-CM

## 2015-12-01 DIAGNOSIS — Z8042 Family history of malignant neoplasm of prostate: Secondary | ICD-10-CM | POA: Insufficient documentation

## 2015-12-01 DIAGNOSIS — Z1239 Encounter for other screening for malignant neoplasm of breast: Secondary | ICD-10-CM

## 2015-12-01 LAB — CBC WITH DIFFERENTIAL/PLATELET
BASO%: 1.5 % (ref 0.0–2.0)
Basophils Absolute: 0 10*3/uL (ref 0.0–0.1)
EOS%: 8.9 % — AB (ref 0.0–7.0)
Eosinophils Absolute: 0.2 10*3/uL (ref 0.0–0.5)
HCT: 44.3 % (ref 34.8–46.6)
HGB: 15 g/dL (ref 11.6–15.9)
LYMPH%: 29.7 % (ref 14.0–49.7)
MCH: 31.4 pg (ref 25.1–34.0)
MCHC: 33.9 g/dL (ref 31.5–36.0)
MCV: 92.9 fL (ref 79.5–101.0)
MONO#: 0.2 10*3/uL (ref 0.1–0.9)
MONO%: 6.9 % (ref 0.0–14.0)
NEUT#: 1.4 10*3/uL — ABNORMAL LOW (ref 1.5–6.5)
NEUT%: 53 % (ref 38.4–76.8)
Platelets: 217 10*3/uL (ref 145–400)
RBC: 4.77 10*6/uL (ref 3.70–5.45)
RDW: 13.4 % (ref 11.2–14.5)
WBC: 2.6 10*3/uL — AB (ref 3.9–10.3)
lymph#: 0.8 10*3/uL — ABNORMAL LOW (ref 0.9–3.3)

## 2015-12-01 LAB — COMPREHENSIVE METABOLIC PANEL
ALT: 24 U/L (ref 0–55)
AST: 22 U/L (ref 5–34)
Albumin: 4.1 g/dL (ref 3.5–5.0)
Alkaline Phosphatase: 109 U/L (ref 40–150)
Anion Gap: 10 mEq/L (ref 3–11)
BILIRUBIN TOTAL: 0.42 mg/dL (ref 0.20–1.20)
BUN: 19.1 mg/dL (ref 7.0–26.0)
CO2: 25 mEq/L (ref 22–29)
Calcium: 9.8 mg/dL (ref 8.4–10.4)
Chloride: 104 mEq/L (ref 98–109)
Creatinine: 1.2 mg/dL — ABNORMAL HIGH (ref 0.6–1.1)
EGFR: 51 mL/min/{1.73_m2} — AB (ref >90)
Glucose: 94 mg/dl (ref 70–140)
Potassium: 4 mEq/L (ref 3.5–5.1)
Sodium: 139 mEq/L (ref 136–145)
TOTAL PROTEIN: 8 g/dL (ref 6.4–8.3)

## 2015-12-01 NOTE — Progress Notes (Signed)
OFFICE PROGRESS NOTE   December 02, 2015   Physicians: Terrence Dupont Rossi/ [place] Bae-Jump,C.Gaccione,  Wallene Dales (PCP [facility] [address]), note this is first time that I have had this name, and patient is requesting referral for another PCP. New patient referral made to Dr Billey Gosling 02-27-16  INTERVAL HISTORY:  Patient is seen, alone for visit, in scheduled follow up of IC3 well differentiated mucinous adenocarinoma (intestinal type) of left ovary, on observation since completing adjuvant carboplatin taxol 12-14-2014. She saw Dr Denman George in 08-2015 and will see her again 3 months from today's visit. Last imaging was CT AP 03-22-15, apparent lymphocyst in pelvis.   Patient has mostly felt well since she was here last, tho some positional RLE discomfort, fatigue particularly after 8 hrs at work, and no luck with weight loss. She has no symptoms that are clearly of concern from standpoint of the gyn cancer or that treatment. Patient believes that TFTs were ok by PCP. Patient reports dull aching in low right buttock laterally when she sleeps on either side; this is not at hip joint, radiates at time to right knee and occasionally to right foot, no low back pain, no swelling, no trauma, no soreness to pressure at hip joint, resolves quickly when she repositions onto back. No other new or different pain. No LE neuropathy.  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 She is not getting any other regular exercise. She does not eat breakfast, does not eat or drink including water while at work, drinks cola and sweet tea after work, first food yesterday was fast food chicken nuggets at 1400 (works 0600 - 1400), by which time she was very hungry. Does not like vegetables, does like fruits.  Bowels are moving regularly. No bladder symptoms. No SOB with this activity. No bleeding. No LE swelling. No recent infectious illness.   Remainder of 10 point Review of Systems negative.   No PAC Preop Ca 125  68 Genetics counseling done previously, patient in agreement with testing to be sent today as she now has insurance coverage.  ONCOLOGIC HISTORY Patient had been in usual good health with entirely regular menstrual periods until indigestion and difficulty eating ~ late Nov 2015, then abdominal fullness and distension by late Dec. The abdominal swelling progressed such that she was seen by PCP at [facility], then ED at [facility] on 07-28-14. CT was obtained at [facility], reportedly with 26 x 12 cm cystic mass with enhancing septations and peripheral nodular soft tissue component, apparently arising from left ovary and occupying entire mid pelvis with deviation of otherwise normal appearing uterus; no ascites, omental cake, peritoneal carcinomatosis or adenopathy per CT. She was seen by Dr Armandina Stammer, with CA 125 68 and CEA normal. She was seen in consultation by Dr Denman George on 08-05-14 and, due to surgical availability, sent to Dr Suzan Slick at [facility]. Surgery on 08-11-14 was exploratory laparotomy with TAH, BSO and lymphadenectomy, R0 at completion although the ovary had ruptured preoperatively. Intraoperatively the appendix, bowel, nodes and upper abdomen were unremarkable. She was DC on post op day 2 on lovenox, which she continues. [facility] surgical pathology [phone]) from 08-11-14 found well differentiated mucinous adenocarcinoma of left ovary intestinal type with expansile pattern and associated mucinous borderline tumor also in the ovary. The primary tumor was 25 cm, no LVSI, 0/16 nodes involved appendix benign and pelvic washings positive (pT1c  pN0; IC3). Case was presented at [facility] multidisciplinary conference, with consideration of FOLFOX vs carbo taxol; final recommendation was for [place] taxol. She had first carboplatin taxol on 08-31-14 and was neutropenic with ANC 0.1 on day 10, gCSF added. Cycle 6 carbo taxol given  12-14-14. CT AP 01-17-15 had 6 x 4.7 x 7.1 cm area at left pelvic sidewall possible lymphocyst, follow up CT 03-22-15 stable lymphocyst.   Objective:  Vital signs in last 24 hours:  BP 126/95 mmHg  Pulse 74  Temp(Src) 98.6 F (37 C) (Oral)  Resp 18  Ht '5\' 2"'$  (1.575 m)  Wt 195 lb 11.2 oz (88.769 kg)  BMI 35.78 kg/m2  SpO2 100% Weight stable   Respirations not labored Alert, oriented and appropriate. Ambulatory without difficulty.  No alopecia  HEENT:PERRL, sclerae not icteric. Oral mucosa moist without lesions, posterior pharynx clear.  Neck supple. No JVD.  Lymphatics:no cervical,supraclavicular, axillary or inguinal adenopathy Resp: clear to auscultation bilaterally and normal percussion bilaterally Cardio: regular rate and rhythm. No gallop. GI: abdomen obese, soft, nontender, not distended, no mass or organomegaly. Normally active bowel sounds. Surgical incision not remarkable. Musculoskeletal/ Extremities: without pitting edema, cords, tenderness Neuro: no significant residual peripheral neuropathy. Otherwise nonfocal. PSYCH appropriate mood and affect Skin without rash, ecchymosis, petechiae   Lab Results:  Results for orders placed or performed in visit on 12/01/15  CBC with Differential  Result Value Ref Range   WBC 2.6 (L) 3.9 - 10.3 10e3/uL   NEUT# 1.4 (L) 1.5 - 6.5 10e3/uL   HGB 15.0 11.6 - 15.9 g/dL   HCT 44.3 34.8 - 46.6 %   Platelets 217 145 - 400 10e3/uL   MCV 92.9 79.5 - 101.0 fL   MCH 31.4 25.1 - 34.0 pg   MCHC 33.9 31.5 - 36.0 g/dL   RBC 4.77 3.70 - 5.45 10e6/uL   RDW 13.4 11.2 - 14.5 %   lymph# 0.8 (L) 0.9 - 3.3 10e3/uL   MONO# 0.2 0.1 - 0.9 10e3/uL   Eosinophils Absolute 0.2 0.0 - 0.5 10e3/uL   Basophils Absolute 0.0 0.0 - 0.1 10e3/uL   NEUT% 53.0 38.4 - 76.8 %   LYMPH% 29.7 14.0 - 49.7 %   MONO% 6.9 0.0 - 14.0 %   EOS% 8.9 (H) 0.0 - 7.0 %   BASO% 1.5 0.0 - 2.0 %  Comprehensive metabolic panel  Result Value Ref Range   Sodium 139 136 - 145  mEq/L   Potassium 4.0 3.5 - 5.1 mEq/L   Chloride 104 98 - 109 mEq/L   CO2 25 22 - 29 mEq/L   Glucose 94 70 - 140 mg/dl   BUN 19.1 7.0 - 26.0 mg/dL   Creatinine 1.2 (H) 0.6 - 1.1 mg/dL   Total Bilirubin 0.42 0.20 - 1.20 mg/dL   Alkaline Phosphatase 109 40 - 150 U/L   AST 22 5 - 34 U/L   ALT 24 0 - 55 U/L   Total Protein 8.0 6.4 - 8.3 g/dL   Albumin 4.1 3.5 - 5.0 g/dL   Calcium 9.8 8.4 - 10.4 mg/dL   Anion Gap 10 3 - 11 mEq/L   EGFR 51 (L) >90 ml/min/1.73 m2  CA 125  Result Value Ref Range   Cancer Antigen (CA) 125 10.7 0.0 - 38.1 U/mL  CA 125 (Parallel Testing)  Result Value Ref Range   CA 125 8 <35 U/mL     Studies/Results: Last mammograms at [facility] 01-17-15, needed again 12-2015  Medications: I have reviewed  the patient's current medications. She will restart K that she has at home, 20 mEq today then 10 mEq daily. I have told patient that I do not believe the toprol has caused weight gain.  Insurance questioning ongoing use of prilosec in March, instructed then to taper down and off, can use OTC pepcid if needed.   DISCUSSION WBC/ ANC in range that she has had, not symptomatic, follow Clinically doing well from standpoint of the left ovarian cancer and that treatment. I have spoken with gyn oncology for next visit there in Sept + lab, and I will see her ~ early Dec.  I have spoken with genetics counselor directly now, who kindly agrees to coordinate blood draw for testing today.  Mammograms due at [facility] July. She has not yet agreed to colonoscopy.  Lengthy discussion about diet and exercise for weight loss. Unfortunately she was not eligible for GOG 0225, due to stage 1 disease, as that type program would likely be very useful for her. She will check # of steps at work with pedometer, aiming for at least 5000 steps daily now. She agrees to drink OJ in AM before work, and to take 10 min breaks during work to drink water. She will decrease sweet tea intake by at  least half, and will increase water intake. Discussed fast food.   Doubt RLE pain related to cancer. Discussed trying to sleep with pillow between knees.  Meds as above. She requests referral to another PCP, agrees with internal medicine given complicated medical history; she is glad to come to [place]. I have made referral to Dr Quay Burow as above and we will let patient know  Assessment/Plan: 1.IC3 mucinous adenocarcinoma of left ovary: preoperative rupture, surgery by gyn oncology at [facility] 08-11-14 (R0), and adjuvant carbo taxol x 6 cycles from 3-1-1 thru 12-14-14, with granix support. 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 Genetics testing to be sent today. She will see Dr Denman George in early Sept and I will see her ~ 3 months after that.  From standpoint of gyn cancer, needs to lose weight to ideal.  2.Obesity, BMI 35.9.  Needs to address diet, regular exercise and weight loss to ideal. Not eligible for GOG 0225 as stage 1 disease. TFTs reportedly not remarkable. 3.hypokalemia again since off K supplement, which she had used with diuretic. No longer on diuretic, still K a little low. Plan as above 4.HTN on metoprolol by PCP.  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 Mammograms due at [facility] in July, ordered now 8.No colonoscopy, which she declines again today. 9.surgical menopause 10. RLE discomfort not clearly bursitis, positional (when sleeps on side). As above.     All questions answered and patient knows to call if concerns prior to next scheduled visit. Time spent 30 min including >50% counseling and coordination of care. CC Dr Denman George. All information will be in [facility] system for Dr Quay Burow' new patient appointment 02-27-16.    Gordy Levan, MD   12/02/2015, 12:33 PM

## 2015-12-01 NOTE — Progress Notes (Signed)
I saw the patient for about 10 minutes to review genetic testing, possible test results and to complete paperwork.  Genetic testing was ordered through [facility].  The breast/ovairan cancer panel was ordered.  The Breast/Ovarian gene panel offered by [facility] includes sequencing and rearrangement analysis for the following 20 genes:  ATM, BARD1, BRCA1, BRCA2, BRIP1, CDH1, CHEK2, EPCAM, FANCC, MLH1, MSH2, MSH6, NBN, PALB2, PMS2, PTEN, RAD51C, RAD51D, TP53, and XRCC2.   The patient will be contacted by the laboratory performing the testing if her OOP cost will be over $100. We will call in about 2-3 weeks with results.  Roma Kayser, [place], [place] Counselor [phone]

## 2015-12-01 NOTE — Telephone Encounter (Signed)
appt made and avs printed °

## 2015-12-02 ENCOUNTER — Telehealth: Payer: Self-pay

## 2015-12-02 ENCOUNTER — Other Ambulatory Visit: Payer: Self-pay | Admitting: Oncology

## 2015-12-02 DIAGNOSIS — C562 Malignant neoplasm of left ovary: Secondary | ICD-10-CM

## 2015-12-02 LAB — CA 125: CANCER ANTIGEN (CA) 125: 10.7 U/mL (ref 0.0–38.1)

## 2015-12-02 LAB — CANCER ANTIGEN 125 (PARALLEL TESTING): CA 125: 8 U/mL (ref <35)

## 2015-12-02 NOTE — Telephone Encounter (Signed)
Patient Demographics     Patient Name Sex DOB SSN Address Phone    Alyssa [name], Alyssa [name] Female [birth year] [id number] [address] [address] [phone] (Home) [phone] (Work)      Message  Received: Norman Herrlich, MD  Baruch Merl, RN           Please let her know that I was able to get a new patient appointment with [facility] internal medicine physician Dr Celso Amy, for Mon 8-28 at 0915. That office will send her an information packet.  (this was first new patient appointment with internal medicine available, but I think is fine)   We need to let her know CA 125 when completed from today, so can give all of this information in one call   thanks

## 2015-12-02 NOTE — Telephone Encounter (Signed)
-----   Message from Gordy Levan, MD sent at 12/02/2015  9:05 AM EDT ----- Labs seen and need follow up: please let her know marker in good low range by both previous lab method and the new lab method

## 2015-12-02 NOTE — Telephone Encounter (Signed)
Pt called for CA 125 results. Given to pt.

## 2015-12-02 NOTE — Telephone Encounter (Signed)
LM in voice mail stating results of CA-125 and new patient appointment as noted below by Dr. Marko Plume. Alyssa [name] can call the [facility] on Monday 12-05-15 if she has any questions or concerns.

## 2016-01-18 ENCOUNTER — Ambulatory Visit: Payer: BLUE CROSS/BLUE SHIELD

## 2016-02-20 ENCOUNTER — Ambulatory Visit: Payer: BLUE CROSS/BLUE SHIELD

## 2016-02-27 ENCOUNTER — Encounter: Payer: Self-pay | Admitting: Internal Medicine

## 2016-02-27 ENCOUNTER — Ambulatory Visit (INDEPENDENT_AMBULATORY_CARE_PROVIDER_SITE_OTHER): Payer: BLUE CROSS/BLUE SHIELD | Admitting: Internal Medicine

## 2016-02-27 ENCOUNTER — Other Ambulatory Visit (INDEPENDENT_AMBULATORY_CARE_PROVIDER_SITE_OTHER): Payer: BLUE CROSS/BLUE SHIELD

## 2016-02-27 VITALS — BP 134/96 | HR 69 | Temp 98.5°F | Resp 18 | Ht 62.0 in | Wt 198.0 lb

## 2016-02-27 DIAGNOSIS — K219 Gastro-esophageal reflux disease without esophagitis: Secondary | ICD-10-CM

## 2016-02-27 DIAGNOSIS — E876 Hypokalemia: Secondary | ICD-10-CM

## 2016-02-27 DIAGNOSIS — E669 Obesity, unspecified: Secondary | ICD-10-CM | POA: Diagnosis not present

## 2016-02-27 DIAGNOSIS — I10 Essential (primary) hypertension: Secondary | ICD-10-CM | POA: Diagnosis not present

## 2016-02-27 DIAGNOSIS — C562 Malignant neoplasm of left ovary: Secondary | ICD-10-CM

## 2016-02-27 DIAGNOSIS — N838 Other noninflammatory disorders of ovary, fallopian tube and broad ligament: Secondary | ICD-10-CM

## 2016-02-27 LAB — COMPREHENSIVE METABOLIC PANEL
ALBUMIN: 4.4 g/dL (ref 3.5–5.2)
ALT: 18 U/L (ref 0–35)
AST: 17 U/L (ref 0–37)
Alkaline Phosphatase: 87 U/L (ref 39–117)
BUN: 18 mg/dL (ref 6–23)
CALCIUM: 9.5 mg/dL (ref 8.4–10.5)
CHLORIDE: 103 meq/L (ref 96–112)
CO2: 29 mEq/L (ref 19–32)
CREATININE: 1.06 mg/dL (ref 0.40–1.20)
GFR: 57.54 mL/min — ABNORMAL LOW (ref >60.00)
Glucose, Bld: 101 mg/dL — ABNORMAL HIGH (ref 70–99)
POTASSIUM: 4.7 meq/L (ref 3.5–5.1)
Sodium: 138 mEq/L (ref 135–145)
Total Bilirubin: 0.3 mg/dL (ref 0.2–1.2)
Total Protein: 7.6 g/dL (ref 6.0–8.3)

## 2016-02-27 MED ORDER — LOSARTAN POTASSIUM 50 MG PO TABS: 50.0000 mg | ORAL_TABLET | Freq: Every day | ORAL | 3 refills | Status: DC

## 2016-02-27 MED ORDER — AMLODIPINE BESYLATE 5 MG PO TABS: 5.0000 mg | ORAL_TABLET | Freq: Every day | ORAL | 3 refills | Status: DC

## 2016-02-27 MED ORDER — OMEPRAZOLE 20 MG PO CPDR: 20.0000 mg | DELAYED_RELEASE_CAPSULE | Freq: Every day | ORAL | 3 refills | Status: DC

## 2016-02-27 NOTE — Patient Instructions (Addendum)
Medications reviewed and updated.  Changes include:  stop the potassium.  Stop the pepcid and restart the omeprazole 20 mg daily.  Decrease metoprolol to 25 mg daily starting tomorrow and continue that for three more days then stop.  Tomorrow start losartan 50 mg daily.  When you stop the metoprolol start amlodipine and take with losartan daily.     Test(s) ordered today. Your results will be released to [place] (or called to you) after review, usually within 72hours after test completion. If any changes need to be made, you will be notified at that same time.  All other Health Maintenance issues reviewed.   All recommended immunizations and age-appropriate screenings are up-to-date or discussed.  No immunizations administered today.    Your prescription(s) have been submitted to your pharmacy. Please take as directed and contact our office if you believe you are having problem(s) with the medication(s).  Please followup in 4 weeks, sooner if needed

## 2016-02-27 NOTE — Assessment & Plan Note (Signed)
Following with gyn, oncology

## 2016-02-27 NOTE — Progress Notes (Signed)
Subjective:    Patient ID: Alyssa [name], female    DOB: Apr 10, 1961, 55 y.o.   MRN: [id number]  HPI She is here to establish with a new pcp.    GERD:  She takes the pepcid twice daily and has GERD almost daily.  She takes advil at night for lumbar radiculopathy.   She was on omeprazole 20 mg daily and that controlled her GERD, but she was switched to the pepcid due to possible long term effects of the PPI.    Hypokalemia:  She is currently not taking any potassium. She was advised to take it, but did not think she needed it.  She wonders if she can officially discontinue it.    Hypertension: She is taking her medication daily. She thinks this medication has caused weight gain.  She would like to switch her medication back to her old medication.  She is compliant with a low sodium diet.  She denies chest pain, regular palpitations, edema, shortness of breath and regular headaches. Her pulse was elevated and she gets very anxious when she goes to the doctor.  She is not exercising regularly.  She does not monitor her blood pressure at home.       Medications and allergies reviewed with patient and updated if appropriate.  Patient Active Problem List   Diagnosis Date Noted  . Family history of prostate cancer 12/01/2015  . Obesity (BMI 35.0-39.9 without comorbidity) ([place]) 06/03/2015  . Hypokalemia due to loss of potassium 11/19/2014  . Essential hypertension 11/11/2014  . Chemotherapy-induced peripheral neuropathy ([place]) 10/18/2014  . Thrush 10/18/2014  . Vasomotor instability 10/05/2014  . Chemotherapy induced neutropenia ([place]) 10/05/2014  . 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 Surgical menopause 08/24/2014  . Left tubo-ovarian mass 08/05/2014  . Ovarian cancer ([place]) 08/02/2014    Current Outpatient Prescriptions on File Prior to Visit  Medication Sig Dispense Refill  . ibuprofen (ADVIL,MOTRIN) 600 MG tablet Take 600 mg by mouth every 6 (six) hours as needed. Reported on  12/01/2015    . metoprolol succinate (TOPROL XL) 25 MG 24 hr tablet Take 1 tablet (25 mg total) by mouth daily. (Patient taking differently: Take 50 mg by mouth daily. Take 2 tablets daily.) 30 tablet 2   Current Facility-Administered Medications on File Prior to Visit  Medication Dose Route Frequency Provider Last Rate Last Dose  . sodium chloride 0.9 % 1,000 mL with potassium chloride 10 mEq infusion   Intravenous Continuous Lennis Marion Downer, MD   Stopped at 12/16/14 1120    Past Medical History:  Diagnosis Date  . Ovarian cancer ([place]) 08/2014   mucinous ovarian cancer    Past Surgical History:  Procedure Laterality Date  . ABDOMINAL HYSTERECTOMY    . ENDOMETRIAL ABLATION  2002  . TUBAL LIGATION  1991    Social History   Social History  . Marital status: Married    Spouse name: N/A  . Number of children: 3  . Years of education: N/A   Social History Main Topics  . Smoking status: Never Smoker  . Smokeless tobacco: None  . Alcohol use No  . Drug use: No  . Sexual activity: Yes   Other Topics Concern  . None   Social History Narrative  . None    Family History  Problem Relation Age of Onset  . Cancer Mother 40    vulvar cancer  . Lung cancer Mother 52  . Stroke Father   . Prostate cancer  Maternal Uncle   . Stroke Maternal Grandmother   . Prostate cancer Maternal Grandfather   . Cervical cancer Paternal Grandmother   . Throat cancer Paternal Grandfather   . Prostate cancer Maternal Uncle     Review of Systems  Constitutional: Negative for appetite change, chills and fever.  Eyes: Negative for visual disturbance.  Respiratory: Negative for cough, shortness of breath and wheezing.   Cardiovascular: Positive for palpitations (occasional). Negative for chest pain and leg swelling.  Gastrointestinal: Positive for nausea (sometimes). Negative for abdominal pain, blood in stool, constipation and diarrhea.       Gerd  Endocrine: Negative for polydipsia and  polyuria.  Genitourinary: Negative for dysuria and hematuria.  Musculoskeletal: Positive for back pain (right lower back pain radiating down right leg, chornic lower back pain) and myalgias (muscle cramps at night in legs).  Neurological: Negative for dizziness, light-headedness and headaches.  Psychiatric/Behavioral: Negative for dysphoric mood. The patient is not nervous/anxious.        Objective:   Vitals:   02/27/16 0937  BP: (!) 134/96  Pulse: 69  Resp: 18  Temp: 98.5 F (36.9 C)   Filed Weights   02/27/16 0937  Weight: 198 lb (89.8 kg)   Body mass index is 36.21 kg/m.   Physical Exam Constitutional: She appears well-developed and well-nourished. No distress.  HENT:  Head: Normocephalic and atraumatic.  Right Ear: External ear normal. Normal ear canal and TM Left Ear: External ear normal.  Normal ear canal and TM Mouth/Throat: Oropharynx is clear and moist.  Eyes: Conjunctivae and EOM are normal.  Neck: Neck supple. No tracheal deviation present. No thyromegaly present.  No carotid bruit  Cardiovascular: Normal rate, regular rhythm and normal heart sounds.   No murmur heard.  No edema. Pulmonary/Chest: Effort normal and breath sounds normal. No respiratory distress. She has no wheezes. She has no rales.  Abdominal: Soft. She exhibits no distension. There is no tenderness.  Lymphadenopathy: She has no cervical adenopathy.  Skin: Skin is warm and dry. She is not diaphoretic.  Psychiatric: She has a normal mood and affect. Her behavior is normal.       Assessment & Plan:   See Problem List for Assessment and Plan of chronic medical problems.

## 2016-02-27 NOTE — Assessment & Plan Note (Signed)
Slightly elevated here and has been slightly elevated On metoprolol and she does not like it - ? Caused weight gain - more likely it is related to being postmenopausal Her HR was elevated and we may not be able to get her off the BB Will try tapering her off metoprolol Start losartan and then add amlodipine Check cmp now and when she returns Monitor BP is she is able

## 2016-02-27 NOTE — Assessment & Plan Note (Signed)
Discussed weight loss at length Needs to start exercising, more importantly needs to change eating habits Will monitor and discuss again at her next visit

## 2016-02-27 NOTE — Assessment & Plan Note (Signed)
Not controlled D/c pepcid Start omeprazole 20 mg daily - benefits outweigh risks, but work on weight loss - plan is to eventually get her off pepcid prn

## 2016-02-27 NOTE — Assessment & Plan Note (Signed)
Last potassium was normal - will d/c potassium Check cmp today and again when she returns in a few weeks

## 2016-02-27 NOTE — Progress Notes (Signed)
Pre visit review using our clinic review tool, if applicable. No additional management support is needed unless otherwise documented below in the visit note. 

## 2016-02-28 ENCOUNTER — Telehealth: Payer: Self-pay | Admitting: Internal Medicine

## 2016-02-28 NOTE — Telephone Encounter (Signed)
Please follow up with patient on how and what BP medication patient needs to take.

## 2016-02-29 NOTE — Telephone Encounter (Signed)
Spoke with pt to inform.  

## 2016-02-29 NOTE — Telephone Encounter (Signed)
Please advise 

## 2016-02-29 NOTE — Telephone Encounter (Signed)
Instructions were on AVS:  Decrease metoprolol to 25 mg daily x 3 days then stop.    Should be taking losartan 50 mg daily.    When you stop the metoprolol start amlodipine and take with losartan daily.

## 2016-03-09 ENCOUNTER — Other Ambulatory Visit (HOSPITAL_BASED_OUTPATIENT_CLINIC_OR_DEPARTMENT_OTHER): Payer: BLUE CROSS/BLUE SHIELD

## 2016-03-09 ENCOUNTER — Encounter: Payer: Self-pay | Admitting: Gynecologic Oncology

## 2016-03-09 ENCOUNTER — Ambulatory Visit: Payer: BLUE CROSS/BLUE SHIELD | Attending: Gynecologic Oncology | Admitting: Gynecologic Oncology

## 2016-03-09 VITALS — BP 127/83 | HR 80 | Temp 97.8°F | Resp 18 | Ht 62.0 in | Wt 198.6 lb

## 2016-03-09 DIAGNOSIS — Z800 Family history of malignant neoplasm of digestive organs: Secondary | ICD-10-CM | POA: Diagnosis not present

## 2016-03-09 DIAGNOSIS — C562 Malignant neoplasm of left ovary: Secondary | ICD-10-CM | POA: Diagnosis not present

## 2016-03-09 DIAGNOSIS — Z801 Family history of malignant neoplasm of trachea, bronchus and lung: Secondary | ICD-10-CM | POA: Insufficient documentation

## 2016-03-09 DIAGNOSIS — Z823 Family history of stroke: Secondary | ICD-10-CM | POA: Insufficient documentation

## 2016-03-09 DIAGNOSIS — Z880 Allergy status to penicillin: Secondary | ICD-10-CM | POA: Diagnosis not present

## 2016-03-09 DIAGNOSIS — Z90710 Acquired absence of both cervix and uterus: Secondary | ICD-10-CM | POA: Diagnosis not present

## 2016-03-09 DIAGNOSIS — Z8042 Family history of malignant neoplasm of prostate: Secondary | ICD-10-CM | POA: Insufficient documentation

## 2016-03-09 DIAGNOSIS — Z8049 Family history of malignant neoplasm of other genital organs: Secondary | ICD-10-CM | POA: Insufficient documentation

## 2016-03-09 DIAGNOSIS — Z881 Allergy status to other antibiotic agents status: Secondary | ICD-10-CM | POA: Insufficient documentation

## 2016-03-09 DIAGNOSIS — R1900 Intra-abdominal and pelvic swelling, mass and lump, unspecified site: Secondary | ICD-10-CM

## 2016-03-09 DIAGNOSIS — N9489 Other specified conditions associated with female genital organs and menstrual cycle: Secondary | ICD-10-CM

## 2016-03-09 DIAGNOSIS — Z885 Allergy status to narcotic agent status: Secondary | ICD-10-CM | POA: Insufficient documentation

## 2016-03-09 DIAGNOSIS — Z8543 Personal history of malignant neoplasm of ovary: Secondary | ICD-10-CM | POA: Insufficient documentation

## 2016-03-09 LAB — COMPREHENSIVE METABOLIC PANEL
ALBUMIN: 3.9 g/dL (ref 3.5–5.0)
ALK PHOS: 96 U/L (ref 40–150)
ALT: 23 U/L (ref 0–55)
ANION GAP: 10 meq/L (ref 3–11)
AST: 23 U/L (ref 5–34)
BUN: 16 mg/dL (ref 7.0–26.0)
CALCIUM: 9.9 mg/dL (ref 8.4–10.4)
CO2: 28 meq/L (ref 22–29)
Chloride: 105 mEq/L (ref 98–109)
Creatinine: 1.1 mg/dL (ref 0.6–1.1)
EGFR: 55 mL/min/{1.73_m2} — AB (ref >90)
Glucose: 80 mg/dl (ref 70–140)
Potassium: 4.2 mEq/L (ref 3.5–5.1)
Sodium: 144 mEq/L (ref 136–145)
TOTAL PROTEIN: 7.8 g/dL (ref 6.4–8.3)
Total Bilirubin: 0.41 mg/dL (ref 0.20–1.20)

## 2016-03-09 LAB — CBC WITH DIFFERENTIAL/PLATELET
BASO%: 1.2 % (ref 0.0–2.0)
Basophils Absolute: 0 10*3/uL (ref 0.0–0.1)
EOS ABS: 0.2 10*3/uL (ref 0.0–0.5)
EOS%: 7 % (ref 0.0–7.0)
HEMATOCRIT: 43.6 % (ref 34.8–46.6)
HGB: 14.4 g/dL (ref 11.6–15.9)
LYMPH%: 24.3 % (ref 14.0–49.7)
MCH: 30.9 pg (ref 25.1–34.0)
MCHC: 33.1 g/dL (ref 31.5–36.0)
MCV: 93.5 fL (ref 79.5–101.0)
MONO#: 0.2 10*3/uL (ref 0.1–0.9)
MONO%: 6.9 % (ref 0.0–14.0)
NEUT%: 60.6 % (ref 38.4–76.8)
NEUTROS ABS: 2.1 10*3/uL (ref 1.5–6.5)
PLATELETS: 222 10*3/uL (ref 145–400)
RBC: 4.67 10*6/uL (ref 3.70–5.45)
RDW: 13.4 % (ref 11.2–14.5)
WBC: 3.5 10*3/uL — AB (ref 3.9–10.3)
lymph#: 0.8 10*3/uL — ABNORMAL LOW (ref 0.9–3.3)

## 2016-03-09 NOTE — Progress Notes (Signed)
Followup Note: Gyn-Onc   CC:  Chief Complaint  Patient presents with  . carcinoma of left ovary    follow up visit    Assessment/Plan:  [name]  is a 55 y.o.  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 No evidence of recurrence on exam today.   1/ left pelvic mass on CT imaging:This appears most consistent with a lymphocyst. It is smooth and regular in appearnce and cystic in appearance. It is stable/decreasing on followup imaging. No additional surveillance is necessary.   2/ Followup: [name] in 3 month, and with me in 6 months.  3/ She was counseled regarding symptoms of recurrence and to notify us sooner if these develop.  4/ CA 125 pending.  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 Discussed that it is proestrogenic and because it is not an FDA medication, its safety with respect to increased breast cancer use is not known.  HPI: Alyssa [name] is a 55 year old G3 P3 who is seen in consultation at the request of Alyssa. Marin [name] for a 26 cm pelvic mass and elevated CA-125. The patient began noting vague abdominal fullness and distention since December 2015. When she was evaluated by her local ER at [name] a CT of the abdomen and pelvis was obtained on 07/28/2014. This revealed a large (26 a 12 cm) cystic mass with enhancing septations and heterogeneous cystic spaces and peripheral nodular soft tissue component. It appeared to be arising from the left ovary. It occupied the entire mid pelvis. There was deviation of the otherwise normal appearing uterus. There was no ascites lymphadenopathy perineal carcinomatosis or omental cake identified. She was seen by Alyssa. Armandina [name] this further evaluated her with a CA-125 which was mildly  elevated at 68. CEA was normal. Given the potential for underlying malignancy she was sent for my evaluation and consultation.  The patient is otherwise healthy woman. She denies abdominal pain, early satiety, or weight loss. Her only prior abdominal surgery is a tubal ligation. She has also undergone endometrial ablation. The family history is not concerning for increased risk for malignancy (her mother had dual cancers of vulva and lung but was a heavy smoker). She is neck for performance status. She is premenopausal and continues to have regular menses with no intermenstrual bleeding.  She has a history of an ASCUS pap 3 years ago with negative hr HPV testing at that time.  She underwent TAH, BSO lymphadenectomy, omentectomy with [name] at [facility] on 08/11/14. 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 The primary tumor was 25 cm, no LDS I, 0 of 16 nodes involved , the tumor was intestinal type with expansile patent and associated with a mucinous borderline tumor of the ovary.  She went on to receive 6 cycles of adjuvant carboplatin and paclitaxel chemotherapy (from 08/31/14 to 12/14/14).  CA 125 at completion of therapy in June, 2016 was 13.   CT chest/abdo/pelvis on 01/17/15 : Status post total abdominal hysterectomy and bilateral salpingo-oophorectomy. Previously noted large pelvis mass has been resected. However, today's study does demonstrate a 6.0 x 4.7 x 7.1 cm well-defined low-attenuation lesion along the left pelvic sidewall (image 71 of series 2), suspicious for local recurrence of disease. No other definite  cystic or solid pelvic mass is identified. No definite peritoneal nodularity.   03/03/15 Followup CT: showed reduction in size of size of left pelvic lymphocyst.  Interval History: she feels well with no complaints and no ongoing symptoms of chemotherapy. No symptoms from lymphocyst compression. She has no  libido and this is distressing to her.  Current Meds:  Outpatient Encounter Prescriptions as of 03/09/2016  Medication Sig  . amLODipine (NORVASC) 5 MG tablet Take 1 tablet (5 mg total) by mouth daily.  Marland Kitchen ibuprofen (ADVIL,MOTRIN) 600 MG tablet Take 600 mg by mouth every 6 (six) hours as needed. Reported on 12/01/2015  . losartan (COZAAR) 50 MG tablet Take 1 tablet (50 mg total) by mouth daily.  Marland Kitchen omeprazole (PRILOSEC) 20 MG capsule Take 1 capsule (20 mg total) by mouth daily.   Facility-Administered Encounter Medications as of 03/09/2016  Medication  . sodium chloride 0.9 % 1,000 mL with potassium chloride 10 mEq infusion    Allergy:  Allergies  Allergen Reactions  . Codeine Itching  . Doxycycline     Per pt, she gets chest pain from doxycycline  . Penicillins Nausea And Vomiting    Social Hx:   Social History   Social History  . Marital status: Married    Spouse name: N/A  . Number of children: 3  . Years of education: N/A   Occupational History  . Not on file.   Social History Main Topics  . Smoking status: Never Smoker  . Smokeless tobacco: Never Used  . Alcohol use No  . Drug use: No  . Sexual activity: Yes   Other Topics Concern  . Not on file   Social History Narrative  . No narrative on file    Past Surgical Hx:  Past Surgical History:  Procedure Laterality Date  . ABDOMINAL HYSTERECTOMY    . APPENDECTOMY     with hysterectomy  . ENDOMETRIAL ABLATION  2002  . TUBAL LIGATION  1991    Past Medical Hx:  Past Medical History:  Diagnosis Date  . Ovarian cancer ([place]) 08/2014   mucinous ovarian cancer    Past Gynecological History:  G3P3 (SVD's), tubal ligation. LMP in December 2015.  No LMP recorded. Patient has had a hysterectomy.  Family Hx:  Family History  Problem Relation Age of Onset  . Cancer Mother 56    vulvar cancer  . Lung cancer Mother 85  . Stroke Father   . Prostate cancer Maternal Uncle   . Stroke Maternal Grandmother   . Prostate  cancer Maternal Grandfather   . Cervical cancer Paternal Grandmother   . Throat cancer Paternal Grandfather   . Prostate cancer Maternal Uncle     Review of Systems:  Constitutional  Feels well,    ENT Normal appearing ears and nares bilaterally Skin/Breast  No rash, sores, jaundice, itching, dryness Cardiovascular  No chest pain, shortness of breath, or edema  Pulmonary  No cough or wheeze.  Gastro Intestinal  No nausea, vomitting, or diarrhoea. No bright red blood per rectum, no abdominal pain, change in bowel movement, or constipation.  Genito Urinary  No frequency, urgency, dysuria, see HPI Musculo Skeletal  No myalgia, arthralgia, joint swelling or pain  Neurologic  No weakness, numbness, change in gait,  Psychology  No depression, anxiety, insomnia.   Vitals:  Blood pressure 127/83, pulse 80, temperature 97.8 F (36.6 C), temperature source Oral, resp. rate 18, height 5\' 2"  (1.575 m), weight 198 lb 9.6 oz (90.1 kg), SpO2 100 %.  Physical Exam: WD in NAD Neck  Supple NROM, without any enlargements.  Lymph Node Survey No cervical supraclavicular or inguinal adenopathy Cardiovascular  Pulse normal rate, regularity and rhythm. S1 and S2 normal.  Lungs  Clear to auscultation bilateraly, without wheezes/crackles/rhonchi. Good air movement.  Skin  No rash/lesions/breakdown  Psychiatry  Alert and oriented to person, place, and time  Abdomen  Normoactive bowel sounds, abdomen soft, non-tender and mildly overweight without evidence of hernia. No palpable masses Back No CVA tenderness Genito Urinary  Vulva/vagina: Normal external female genitalia.  No lesions. No discharge or bleeding.  Bladder/urethra:  No lesions or masses, well supported bladder  Vagina: regular with no lesions  Cervix: surgically absent  Uterus: surgically absent  Adnexa:no longer able to palpate cystic mass in left pelvis Rectal  Good tone, no masses no cul de sac nodularity.  Extremities   No bilateral cyanosis, clubbing or edema.   Donaciano Eva, MD   03/09/2016, 1:09 PM

## 2016-03-09 NOTE — Patient Instructions (Addendum)
Avlimil is the name of the supplement that can help with low arousal.  Plan to follow up with Dr. Marko Plume in three months or sooner if needed.  Follow up with Dr. Denman George in six months.  That appt can be made when you come to see Dr. Marko Plume.

## 2016-03-10 LAB — CA 125: Cancer Antigen (CA) 125: 10.2 U/mL (ref 0.0–38.1)

## 2016-03-10 LAB — CANCER ANTIGEN 125 (PARALLEL TESTING): CA 125: 8 U/mL (ref <35)

## 2016-03-13 ENCOUNTER — Telehealth: Payer: Self-pay

## 2016-03-13 NOTE — Telephone Encounter (Signed)
Incoming call , patient requesting results from her CA 125 level , findings CA 125 ( 8 ) , WNL . Patient informed of results , states understanding, denies further questions at this time. Melissa Cross, APNP updated as well as Dr Mariana Kaufman nurse.

## 2016-03-14 ENCOUNTER — Other Ambulatory Visit: Payer: Self-pay | Admitting: Oncology

## 2016-04-06 ENCOUNTER — Ambulatory Visit (INDEPENDENT_AMBULATORY_CARE_PROVIDER_SITE_OTHER): Payer: BLUE CROSS/BLUE SHIELD | Admitting: Internal Medicine

## 2016-04-06 ENCOUNTER — Other Ambulatory Visit (INDEPENDENT_AMBULATORY_CARE_PROVIDER_SITE_OTHER): Payer: BLUE CROSS/BLUE SHIELD

## 2016-04-06 ENCOUNTER — Encounter: Payer: Self-pay | Admitting: Internal Medicine

## 2016-04-06 VITALS — BP 118/86 | HR 76 | Temp 98.5°F | Resp 16 | Wt 199.0 lb

## 2016-04-06 DIAGNOSIS — I10 Essential (primary) hypertension: Secondary | ICD-10-CM | POA: Diagnosis not present

## 2016-04-06 DIAGNOSIS — E876 Hypokalemia: Secondary | ICD-10-CM

## 2016-04-06 DIAGNOSIS — K219 Gastro-esophageal reflux disease without esophagitis: Secondary | ICD-10-CM | POA: Diagnosis not present

## 2016-04-06 DIAGNOSIS — Z23 Encounter for immunization: Secondary | ICD-10-CM | POA: Diagnosis not present

## 2016-04-06 LAB — COMPREHENSIVE METABOLIC PANEL
ALT: 29 U/L (ref 0–35)
AST: 26 U/L (ref 0–37)
Albumin: 4.4 g/dL (ref 3.5–5.2)
Alkaline Phosphatase: 100 U/L (ref 39–117)
BUN: 22 mg/dL (ref 6–23)
CO2: 31 meq/L (ref 19–32)
Calcium: 10.2 mg/dL (ref 8.4–10.5)
Chloride: 101 mEq/L (ref 96–112)
Creatinine, Ser: 1.06 mg/dL (ref 0.40–1.20)
GFR: 57.51 mL/min — AB (ref >60.00)
GLUCOSE: 81 mg/dL (ref 70–99)
POTASSIUM: 4.4 meq/L (ref 3.5–5.1)
SODIUM: 139 meq/L (ref 135–145)
Total Bilirubin: 0.5 mg/dL (ref 0.2–1.2)
Total Protein: 8.1 g/dL (ref 6.0–8.3)

## 2016-04-06 NOTE — Progress Notes (Signed)
Subjective:    Patient ID: Alyssa [name], female    DOB: 1961/06/02, 55 y.o.   MRN: [id number]  HPI The patient is here for follow up.  GERD:  We restarted omeprazole 20 mg daily because her was not controlled with Pepcid. She is taking her medication daily as prescribed.  She denies any GERD symptoms and feels her GERD is well controlled.   Hypertension: We changed her medication and her last visit-4 weeks ago. We discontinue metoprolol because she was concerned it was causing side effects. She was put on losartan and amlodipine. She is taking her medication daily. She is compliant with a low sodium diet.  She denies chest pain, palpitations, edema, shortness of breath and regular headaches. She is exercising regularly.  She does not monitor her blood pressure at home.    Hypokalemia: She was taking potassium, but we discontinued this 4 weeks ago. Her potassium has been in the normal range with low dose potassium and she is hoping she can officially discontinue it.  Mole on side: It has been there a while, But she wanted to have me look at it to make sure it was okay. It does get scaly itches a little.  Medications and allergies reviewed with patient and updated if appropriate.  Patient Active Problem List   Diagnosis Date Noted  . 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 Family history of prostate cancer 12/01/2015  . Esophageal reflux 09/15/2015  . Obesity (BMI 35.0-39.9 without comorbidity) 06/03/2015  . Hypokalemia due to loss of potassium 11/19/2014  . Essential hypertension 11/11/2014  . Chemotherapy-induced peripheral neuropathy ([place]) 10/18/2014  . Vasomotor instability 10/05/2014  . Chemotherapy induced neutropenia ([place]) 10/05/2014  . 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 Surgical menopause 08/24/2014  . Ovarian cancer ([place]) 08/02/2014    Current Outpatient Prescriptions on File Prior to Visit  Medication Sig Dispense  Refill  . amLODipine (NORVASC) 5 MG tablet Take 1 tablet (5 mg total) by mouth daily. 30 tablet 3  . ibuprofen (ADVIL,MOTRIN) 600 MG tablet Take 600 mg by mouth every 6 (six) hours as needed. Reported on 12/01/2015    . losartan (COZAAR) 50 MG tablet Take 1 tablet (50 mg total) by mouth daily. 30 tablet 3  . omeprazole (PRILOSEC) 20 MG capsule Take 1 capsule (20 mg total) by mouth daily. 90 capsule 3   Current Facility-Administered Medications on File Prior to Visit  Medication Dose Route Frequency Provider Last Rate Last Dose  . sodium chloride 0.9 % 1,000 mL with potassium chloride 10 mEq infusion   Intravenous Continuous Lennis Marion Downer, MD   Stopped at 12/16/14 1120    Past Medical History:  Diagnosis Date  . Ovarian cancer ([place]) 08/2014   mucinous ovarian cancer    Past Surgical History:  Procedure Laterality Date  . ABDOMINAL HYSTERECTOMY    . APPENDECTOMY     with hysterectomy  . ENDOMETRIAL ABLATION  2002  . TUBAL LIGATION  1991    Social History   Social History  . Marital status: Married    Spouse name: N/A  . Number of children: 3  . Years of education: N/A   Social History Main Topics  . Smoking status: Never Smoker  . Smokeless tobacco: Never Used  . Alcohol use No  . Drug use: No  . Sexual activity: Yes   Other Topics Concern  . Not on file   Social History Narrative  .  No narrative on file    Family History  Problem Relation Age of Onset  . Cancer Mother 33    vulvar cancer  . Lung cancer Mother 73  . Stroke Father   . Prostate cancer Maternal Uncle   . Stroke Maternal Grandmother   . Prostate cancer Maternal Grandfather   . Cervical cancer Paternal Grandmother   . Throat cancer Paternal Grandfather   . Prostate cancer Maternal Uncle     Review of Systems  Constitutional: Negative for fever.  Respiratory: Negative for cough, shortness of breath and wheezing.   Cardiovascular: Positive for leg swelling (  ? Left , ? fluid retention).  Negative for chest pain and palpitations.  Neurological: Positive for headaches. Negative for light-headedness.       Objective:   Vitals:   04/06/16 0945  BP: 118/86  Pulse: 76  Resp: 16  Temp: 98.5 F (36.9 C)   Filed Weights   04/06/16 0945  Weight: 199 lb (90.3 kg)   Body mass index is 36.4 kg/m.   Physical Exam    Constitutional: Appears well-developed and well-nourished. No distress.  HENT:  Head: Normocephalic and atraumatic.  Neck: Neck supple. No tracheal deviation present. No thyromegaly present.  No cervical lymphadenopathy Cardiovascular: Normal rate, regular rhythm and normal heart sounds.   No murmur heard. No carotid bruit .  No edema Pulmonary/Chest: Effort normal and breath sounds normal. No respiratory distress. No has no wheezes. No rales.  Skin: Skin is warm and dry. Not diaphoretic.  normal-appearing mole on left side-seborrheic keratosis  Psychiatric: Normal mood and affect. Behavior is normal.      Assessment & Plan:    See Problem List for Assessment and Plan of chronic medical problems.   Follow-up in 6 months

## 2016-04-06 NOTE — Patient Instructions (Addendum)
  Test(s) ordered today. Your results will be released to MyChart (or called to you) after review, usually within 72hours after test completion. If any changes need to be made, you will be notified at that same time.  All other Health Maintenance issues reviewed.   All recommended immunizations and age-appropriate screenings are up-to-date or discussed.  Flu vaccine administered today.   Medications reviewed and updated.  No changes recommended at this time.    Please followup in 6 months   

## 2016-04-06 NOTE — Assessment & Plan Note (Signed)
Potassium supplementation discontinued at her last visit CMP today

## 2016-04-06 NOTE — Progress Notes (Signed)
Pre visit review using our clinic review tool, if applicable. No additional management support is needed unless otherwise documented below in the visit note. 

## 2016-04-06 NOTE — Assessment & Plan Note (Signed)
BP well controlled Current regimen effective and well tolerated Continue current medications at current doses cmp  

## 2016-04-06 NOTE — Assessment & Plan Note (Signed)
GERD controlled with daily omeprazole 20 mg Continue daily medication

## 2016-04-08 ENCOUNTER — Encounter: Payer: Self-pay | Admitting: Internal Medicine

## 2016-04-10 NOTE — Telephone Encounter (Signed)
Please advise 

## 2016-05-27 ENCOUNTER — Other Ambulatory Visit: Payer: Self-pay | Admitting: Oncology

## 2016-05-27 DIAGNOSIS — C562 Malignant neoplasm of left ovary: Secondary | ICD-10-CM

## 2016-06-04 ENCOUNTER — Ambulatory Visit: Payer: BLUE CROSS/BLUE SHIELD | Admitting: Oncology

## 2016-06-04 ENCOUNTER — Other Ambulatory Visit: Payer: BLUE CROSS/BLUE SHIELD

## 2016-06-04 ENCOUNTER — Telehealth: Payer: Self-pay

## 2016-06-04 NOTE — Telephone Encounter (Signed)
lvm we will r/s her appt. In basket sent.

## 2016-06-06 ENCOUNTER — Telehealth: Payer: Self-pay | Admitting: *Deleted

## 2016-06-06 NOTE — Telephone Encounter (Signed)
"  I've checked my schedule and will be able to keep the appointments on June 18, 2016.  Confirmed appointment date and time with this call.

## 2016-06-18 ENCOUNTER — Ambulatory Visit (HOSPITAL_BASED_OUTPATIENT_CLINIC_OR_DEPARTMENT_OTHER): Payer: BLUE CROSS/BLUE SHIELD | Admitting: Oncology

## 2016-06-18 ENCOUNTER — Telehealth: Payer: Self-pay | Admitting: *Deleted

## 2016-06-18 ENCOUNTER — Encounter: Payer: Self-pay | Admitting: Oncology

## 2016-06-18 ENCOUNTER — Telehealth: Payer: Self-pay | Admitting: Oncology

## 2016-06-18 ENCOUNTER — Ambulatory Visit (HOSPITAL_BASED_OUTPATIENT_CLINIC_OR_DEPARTMENT_OTHER): Payer: BLUE CROSS/BLUE SHIELD

## 2016-06-18 ENCOUNTER — Other Ambulatory Visit: Payer: Self-pay | Admitting: Oncology

## 2016-06-18 VITALS — BP 129/82 | HR 73 | Temp 98.7°F | Resp 18 | Ht 62.0 in | Wt 201.6 lb

## 2016-06-18 DIAGNOSIS — E669 Obesity, unspecified: Secondary | ICD-10-CM | POA: Diagnosis not present

## 2016-06-18 DIAGNOSIS — C562 Malignant neoplasm of left ovary: Secondary | ICD-10-CM | POA: Diagnosis not present

## 2016-06-18 DIAGNOSIS — I10 Essential (primary) hypertension: Secondary | ICD-10-CM

## 2016-06-18 DIAGNOSIS — Z1239 Encounter for other screening for malignant neoplasm of breast: Secondary | ICD-10-CM

## 2016-06-18 DIAGNOSIS — Z6837 Body mass index (BMI) 37.0-37.9, adult: Secondary | ICD-10-CM

## 2016-06-18 DIAGNOSIS — Z20828 Contact with and (suspected) exposure to other viral communicable diseases: Secondary | ICD-10-CM

## 2016-06-18 DIAGNOSIS — M25551 Pain in right hip: Secondary | ICD-10-CM | POA: Diagnosis not present

## 2016-06-18 LAB — CBC WITH DIFFERENTIAL/PLATELET
BASO%: 0.9 % (ref 0.0–2.0)
Basophils Absolute: 0 10*3/uL (ref 0.0–0.1)
EOS%: 4.6 % (ref 0.0–7.0)
Eosinophils Absolute: 0.2 10*3/uL (ref 0.0–0.5)
HCT: 42 % (ref 34.8–46.6)
HEMOGLOBIN: 14.1 g/dL (ref 11.6–15.9)
LYMPH%: 22 % (ref 14.0–49.7)
MCH: 30.9 pg (ref 25.1–34.0)
MCHC: 33.6 g/dL (ref 31.5–36.0)
MCV: 92.1 fL (ref 79.5–101.0)
MONO#: 0.2 10*3/uL (ref 0.1–0.9)
MONO%: 6.4 % (ref 0.0–14.0)
NEUT%: 66.1 % (ref 38.4–76.8)
NEUTROS ABS: 2.3 10*3/uL (ref 1.5–6.5)
Platelets: 237 10*3/uL (ref 145–400)
RBC: 4.56 10*6/uL (ref 3.70–5.45)
RDW: 13 % (ref 11.2–14.5)
WBC: 3.5 10*3/uL — AB (ref 3.9–10.3)
lymph#: 0.8 10*3/uL — ABNORMAL LOW (ref 0.9–3.3)

## 2016-06-18 LAB — COMPREHENSIVE METABOLIC PANEL
ALBUMIN: 3.7 g/dL (ref 3.5–5.0)
ALK PHOS: 102 U/L (ref 40–150)
ALT: 38 U/L (ref 0–55)
AST: 29 U/L (ref 5–34)
Anion Gap: 8 mEq/L (ref 3–11)
BILIRUBIN TOTAL: 0.34 mg/dL (ref 0.20–1.20)
BUN: 16.4 mg/dL (ref 7.0–26.0)
CO2: 28 mEq/L (ref 22–29)
Calcium: 9.6 mg/dL (ref 8.4–10.4)
Chloride: 105 mEq/L (ref 98–109)
Creatinine: 1 mg/dL (ref 0.6–1.1)
EGFR: 65 mL/min/{1.73_m2} — ABNORMAL LOW (ref >90)
GLUCOSE: 103 mg/dL (ref 70–140)
Potassium: 3.9 mEq/L (ref 3.5–5.1)
SODIUM: 140 meq/L (ref 136–145)
TOTAL PROTEIN: 7.5 g/dL (ref 6.4–8.3)

## 2016-06-18 MED ORDER — OSELTAMIVIR PHOSPHATE 75 MG PO CAPS: 75.0000 mg | ORAL_CAPSULE | Freq: Every day | ORAL | 0 refills | Status: DC

## 2016-06-18 NOTE — Progress Notes (Unsigned)
OFFICE PROGRESS NOTE   June 18, 2016   Physicians:  INTERVAL HISTORY:     ONCOLOGIC HISTORY  No history exists.    Review of systems as above, also:  Remainder of 10 point Review of Systems negative.  Objective:  Vital signs in last 24 hours:  There were no vitals taken for this visit.  Alert, oriented and appropriate. Ambulatory without assistance difficulty.  Alopecia  HEENT:PERRL, sclerae not icteric. Oral mucosa moist without lesions, posterior pharynx clear.  Neck supple. No JVD.  Lymphatics:no cervical,suraclavicular, axillary or inguinal adenopathy Resp: clear to auscultation bilaterally and normal percussion bilaterally Cardio: regular rate and rhythm. No gallop. GI: soft, nontender, not distended, no mass or organomegaly. Normally active bowel sounds. Surgical incision not remarkable. Musculoskeletal/ Extremities: without pitting edema, cords, tenderness Neuro: no peripheral neuropathy. Otherwise nonfocal Skin without rash, ecchymosis, petechiae Breasts: without dominant mass, skin or nipple findings. Axillae benign. Portacath-without erythema or tenderness  Lab Results:  Results for orders placed or performed in visit on 06/18/16  CBC with Differential  Result Value Ref Range   WBC 3.5 (L) 3.9 - 10.3 10e3/uL   NEUT# 2.3 1.5 - 6.5 10e3/uL   HGB 14.1 11.6 - 15.9 g/dL   HCT 42.0 34.8 - 46.6 %   Platelets 237 145 - 400 10e3/uL   MCV 92.1 79.5 - 101.0 fL   MCH 30.9 25.1 - 34.0 pg   MCHC 33.6 31.5 - 36.0 g/dL   RBC 4.56 3.70 - 5.45 10e6/uL   RDW 13.0 11.2 - 14.5 %   lymph# 0.8 (L) 0.9 - 3.3 10e3/uL   MONO# 0.2 0.1 - 0.9 10e3/uL   Eosinophils Absolute 0.2 0.0 - 0.5 10e3/uL   Basophils Absolute 0.0 0.0 - 0.1 10e3/uL   NEUT% 66.1 38.4 - 76.8 %   LYMPH% 22.0 14.0 - 49.7 %   MONO% 6.4 0.0 - 14.0 %   EOS% 4.6 0.0 - 7.0 %   BASO% 0.9 0.0 - 2.0 %     Studies/Results:  No results found.  Medications: I have reviewed the patient's current  medications.  DISCUSSION  Genetics testing from 12-2015  Clarene Essex, Counselor  Gordy Levan, MD        It had been put on a billing hold according to my notes. I will call the lab and find out more if I can.      Assessment/Plan:        Evlyn Clines, MD   06/18/2016, 11:43 AM

## 2016-06-18 NOTE — Telephone Encounter (Signed)
"  I'm calling for my Pharmacy who needs clarification.  Tamiflu is normally a ten day supply.  I was ordered this because my daughter has the flu but it's only a seven day supply.  I am to only have seven days or does the order need to be for ten day supply.  Please call me and  the pharmacy to clarify this order [phone]."

## 2016-06-18 NOTE — Progress Notes (Signed)
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 She saw Dr Denman George 03-09-16 and will see her again 3 months from this visit. Last imaging was CT AP 03-2015.  Patient has been generally well since she was here last, with exception of more bothersome pain right hip to right lateral upper leg/ knee. This discomfort was present when I saw her last in 12-2015, but more constant and bothersome now, especially after she stands for hours at work. No other new or different pain. She is using OTC patch, possibly helpful. She has not used any NSAID recently, as Dr Quay Burow concerned about renal function; she is more aware of good oral hydration. Appetite is good, no regular exercise, no abdominal or pelvic discomfort, no bleeding. No LE swelling, no skin rash, no respiratory symptoms, no changes in breasts.  Family member has just been diagnosed with influenza, patient has been with her all week. Patient has no flu symptoms as yet, did have flu vaccine. Remainder of 10 point Review of Systems negative.   No PAC Preop Ca 125  68 Genetics counseling done previously, Breast Ovarian Panel by [facility] planned, however apparently some financial issue as this does not seem to have been completed. [place] genetics counselors aware per my communication previously. Flu vaccine 04-06-16   ONCOLOGIC HISTORY Patient had been in usual good health with entirely regular menstrual periods until indigestion and difficulty eating ~ late Nov 2015, then abdominal fullness and distension by late Dec. The abdominal swelling progressed such that she was seen by PCP at [facility], then ED at [facility] on 07-28-14. CT was obtained at [facility], reportedly with 26 x  12 cm cystic mass with enhancing septations and peripheral nodular soft tissue component, apparently arising from left ovary and occupying entire mid pelvis with deviation of otherwise normal appearing uterus; no ascites, omental cake, peritoneal carcinomatosis or adenopathy per CT. She was seen by Dr Armandina Stammer, with CA 125 68 and CEA normal. She was seen in consultation by Dr Denman George on 08-05-14 and, due to surgical availability, sent to Dr Suzan Slick at [facility]. Surgery on 08-11-14 was exploratory laparotomy with TAH, BSO and lymphadenectomy, R0 at completion although the ovary had ruptured preoperatively. Intraoperatively the appendix, bowel, nodes and upper abdomen were unremarkable. She was DC on post op day 2 on lovenox, which she continues. 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 The primary tumor was 25 cm, no LVSI, 0/16 nodes involved appendix benign and pelvic washings positive (pT1c pN0; IC3). Case was presented at [facility] multidisciplinary conference, with consideration of FOLFOX vs carbo taxol; final recommendation was for [place] taxol. She had first carboplatin taxol on 08-31-14 and was neutropenic with ANC 0.1 on day 10, gCSF added. Cycle 6 carbo taxol given 12-14-14. CT AP 01-17-15 had 6 x 4.7 x 7.1 cm area at left pelvic sidewall possible lymphocyst, follow up CT 03-22-15 stable lymphocyst.   Objective:  Vital signs in last 24 hours:  BP 129/82 (BP Location: Left Arm, Patient Position: Sitting)   Pulse 73   Temp 98.7 F (37.1 C) (Oral)   Resp 18   Ht 5'  2" (1.575 m)   Wt 201 lb 9.6 oz (91.4 kg)   SpO2 98%   BMI 36.87 kg/m  Weight up 6 lbs from 12-2015 Alert, oriented and appropriate, looks comfortable and very pleasant as always.. Ambulatory without difficulty, able to get on and off exam table.   HEENT:PERRL, sclerae  not icteric. Oral mucosa moist without lesions, posterior pharynx clear.  Neck supple. No JVD.  Lymphatics:no cervical,supraclavicular, axillary or inguinal adenopathy Resp: clear to auscultation bilaterally and normal percussion bilaterally Cardio: regular rate and rhythm. No gallop. GI: soft, nontender, not distended, no mass or organomegaly. Normally active bowel sounds. Surgical incision not remarkable. Musculoskeletal/ Extremities: LE without pitting edema, cords, tenderness. Back not tender. Not tender to direct palpation at right hip.  Neuro: no peripheral neuropathy. Otherwise nonfocal. PSYCH appropriate mood and affect Skin without rash, ecchymosis, petechiae Breasts: without dominant mass, skissn or nipple findings. Axillae benign.   Lab Results:  Results for orders placed or performed in visit on 06/18/16  CBC with Differential  Result Value Ref Range   WBC 3.5 (L) 3.9 - 10.3 10e3/uL   NEUT# 2.3 1.5 - 6.5 10e3/uL   HGB 14.1 11.6 - 15.9 g/dL   HCT 42.0 34.8 - 46.6 %   Platelets 237 145 - 400 10e3/uL   MCV 92.1 79.5 - 101.0 fL   MCH 30.9 25.1 - 34.0 pg   MCHC 33.6 31.5 - 36.0 g/dL   RBC 4.56 3.70 - 5.45 10e6/uL   RDW 13.0 11.2 - 14.5 %   lymph# 0.8 (L) 0.9 - 3.3 10e3/uL   MONO# 0.2 0.1 - 0.9 10e3/uL   Eosinophils Absolute 0.2 0.0 - 0.5 10e3/uL   Basophils Absolute 0.0 0.0 - 0.1 10e3/uL   NEUT% 66.1 38.4 - 76.8 %   LYMPH% 22.0 14.0 - 49.7 %   MONO% 6.4 0.0 - 14.0 %   EOS% 4.6 0.0 - 7.0 %   BASO% 0.9 0.0 - 2.0 %  Comprehensive metabolic panel  Result Value Ref Range   Sodium 140 136 - 145 mEq/L   Potassium 3.9 3.5 - 5.1 mEq/L   Chloride 105 98 - 109 mEq/L   CO2 28 22 - 29 mEq/L   Glucose 103 70 - 140 mg/dl   BUN 16.4 7.0 - 26.0 mg/dL   Creatinine 1.0 0.6 - 1.1 mg/dL   Total Bilirubin 0.34 0.20 - 1.20 mg/dL   Alkaline Phosphatase 102 40 - 150 U/L   AST 29 5 - 34 U/L   ALT 38 0 - 55 U/L   Total Protein 7.5 6.4 - 8.3 g/dL   Albumin 3.7 3.5 - 5.0 g/dL   Calcium  9.6 8.4 - 10.4 mg/dL   Anion Gap 8 3 - 11 mEq/L   EGFR 65 (L) >90 ml/min/1.73 m2   CA 125 available after visit stable in good low range at 10, this having been 10.2 3 months ago and 10.7 in June 2017.  Studies/Results:  Overdue mammograms, last at [facility] 01-17-15. She agrees to having these done prior to end of year, as she has met deductible.   Medications: I have reviewed the patient's current medications. Tamiflu 75 mg daily prophylactic due to direct contact with family member in home with influenza.  DISCUSSION Clinically doing well from standpoint of the gyn cancer and that treatment. She will see Dr Denman George in March with CA 125. She is aware that another medical oncologist will be working with the gyn oncology team after Jan, may be followed  either by Dr Denman George or alternate Dr Denman George and medical oncology, per Dr Serita Grit recommendation then.   She agrees to mammograms as above  Tamiflu prophylaxis for flu exposure as above. Her husband needs to speak with his MD about tamiflu also today.  She will try NSAID as ibuprofen or aleve with food 1-2x daily next few days, increasing po fluid intake by double her usual during that time. If right hip symptoms do not resolve, she should let Dr Quay Burow know.   Assessment/Plan:  1.IC3 mucinous adenocarcinoma of left ovary: preoperative rupture, surgery by gyn oncology at [facility] 08-11-14 (R0), and adjuvant carbo taxol x 6 cycles from 3-1-1 thru 12-14-14. 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 Genetics testing has not been accomplished. Next visit will be with Dr Denman George with CA 125 in March 2.Obesity, BMI 37.  Needs to address diet, regular exercise and weight loss to ideal. Was not eligible for GOG 0225 as stage 1 disease. TFTs reportedly not remarkable. 3.direct exposure to influenza: begin tamiflu today prophylactic 4.HTN managed by PCP.  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 Mammograms overdue at [place]: she agrees to these next week, order placed now. 8.No colonoscopy, which she has declined so far 9.surgical menopause 10. RLE discomfort gradually more bothersome over >6 months. Short trial of NSAID with good oral hydration. May need other evaluation. 11. Flu vaccine done 04-06-16. She understands that this may not be very effective with flu strains this year  All questions answered and she knows to call if concerns prior to next scheduled visit. Time spent 25 min including >50% counseling and coordination of care. CC PCP, Dr Leone Payor, MD   06/18/2016, 6:26 PM

## 2016-06-18 NOTE — Telephone Encounter (Signed)
Medical Oncology  MD spoke with pharmacist.  Their prophylactic tamiflu is packaged in #10; if they break the package to dispense #7, the rest will be wasted. Fine to give #10 Pharmacist will call patient, who has not picked it up as yet.  Godfrey Pick, MD

## 2016-06-19 DIAGNOSIS — M25551 Pain in right hip: Secondary | ICD-10-CM | POA: Insufficient documentation

## 2016-06-19 LAB — CA 125: CANCER ANTIGEN (CA) 125: 10 U/mL (ref 0.0–38.1)

## 2016-06-20 ENCOUNTER — Telehealth: Payer: Self-pay | Admitting: *Deleted

## 2016-06-20 NOTE — Telephone Encounter (Signed)
Dr. Marko Plume spoke with the pharmacy on 06-18-16 evening.

## 2016-06-20 NOTE — Telephone Encounter (Signed)
"  I'm calling for my CA 125 results."  Ca-125 = 10 and stable.  No further questions.

## 2016-06-26 ENCOUNTER — Telehealth: Payer: Self-pay | Admitting: Internal Medicine

## 2016-06-26 ENCOUNTER — Ambulatory Visit
Admission: RE | Admit: 2016-06-26 | Discharge: 2016-06-26 | Disposition: A | Discharge status: Home / Self Care | Payer: BLUE CROSS/BLUE SHIELD | Source: Ambulatory Visit | Attending: Oncology | Admitting: Oncology

## 2016-06-26 DIAGNOSIS — Z1231 Encounter for screening mammogram for malignant neoplasm of breast: Secondary | ICD-10-CM | POA: Diagnosis not present

## 2016-06-26 DIAGNOSIS — Z1239 Encounter for other screening for malignant neoplasm of breast: Secondary | ICD-10-CM

## 2016-06-26 MED ORDER — AMLODIPINE BESYLATE 5 MG PO TABS: 5.0000 mg | ORAL_TABLET | Freq: Every day | ORAL | 5 refills | Status: DC

## 2016-06-26 MED ORDER — LOSARTAN POTASSIUM 50 MG PO TABS: 50.0000 mg | ORAL_TABLET | Freq: Every day | ORAL | 5 refills | Status: DC

## 2016-06-26 NOTE — Telephone Encounter (Signed)
Refills have been sent.  

## 2016-06-26 NOTE — Telephone Encounter (Signed)
Pt request refill for amlodipine and losartan send to [facility]. Please help, she has 2 pills left.

## 2016-06-27 ENCOUNTER — Telehealth: Payer: Self-pay | Admitting: Genetic Counselor

## 2016-06-27 NOTE — Telephone Encounter (Signed)
Pt decline genetic testing.

## 2016-07-03 ENCOUNTER — Telehealth: Payer: Self-pay

## 2016-07-03 ENCOUNTER — Other Ambulatory Visit: Payer: Self-pay | Admitting: Oncology

## 2016-07-03 NOTE — Telephone Encounter (Signed)
S/w Tammy at Dr Huston Foley office that it is OK to remove tooth, per Dr Mariana Kaufman attached note. She took verbal authorization.  LVM with pt that it is OK for tooth removal.

## 2016-07-03 NOTE — Telephone Encounter (Signed)
Pt needs an OK from Dr Marko Plume for a tooth removal. Dentist is [facility] in [place]. To be done Wed 07/11/16. Last taxol/carbo was 12/14/14, dentist still wants Dr Mariana Kaufman input.

## 2016-07-03 NOTE — Telephone Encounter (Signed)
-----   Message from Gordy Levan, MD sent at 07/03/2016  3:57 PM EST ----- Regarding: dental extraction  RN note: Pt needs an OK from Dr Marko Plume for a tooth removal. Dentist is [facility] in [place]. To be done Wed 07/11/16. Last taxol/carbo was 12/14/14, dentist still wants Dr Mariana Kaufman input.  MD reply: She is fine from my standpoint for dental procedure. Please let dentist know  Thank you

## 2016-07-10 ENCOUNTER — Other Ambulatory Visit: Payer: Self-pay | Admitting: Nurse Practitioner

## 2016-07-24 ENCOUNTER — Other Ambulatory Visit: Payer: Self-pay | Admitting: Emergency Medicine

## 2016-07-24 MED ORDER — AMLODIPINE BESYLATE 5 MG PO TABS: 5.0000 mg | ORAL_TABLET | Freq: Every day | ORAL | 0 refills | Status: DC

## 2016-07-24 MED ORDER — LOSARTAN POTASSIUM 50 MG PO TABS: 50.0000 mg | ORAL_TABLET | Freq: Every day | ORAL | 0 refills | Status: DC

## 2016-07-26 ENCOUNTER — Other Ambulatory Visit: Payer: Self-pay | Admitting: Internal Medicine

## 2016-08-03 DIAGNOSIS — K136 Irritative hyperplasia of oral mucosa: Secondary | ICD-10-CM | POA: Diagnosis not present

## 2016-08-09 DIAGNOSIS — K136 Irritative hyperplasia of oral mucosa: Secondary | ICD-10-CM | POA: Diagnosis not present

## 2016-08-30 NOTE — Progress Notes (Signed)
Followup Note: Gyn-Onc   CC:  Chief Complaint  Patient presents with  . Ovarian Cancer    Assessment/Plan:  [name]  is a 56 y.o.  year old with a history of stage IC3 well differentiated mucinous adenocarcinoma (intestinal type) of the left ovary, s/p staging surgery at [facility] on 08/11/14, followed by 6 cycles of adjuvant carboplatin and paclitaxel. No evidence of recurrence on exam today.   1/ left pelvic mass on CT imaging:This appears most consistent with a lymphocyst. It is smooth and regular in appearnce and cystic in appearance. It is stable/decreasing on followup imaging. No additional surveillance is necessary.   2/ Followup: with me in 6 months as she is now 2 year s/p treatment.  3/ She was counseled regarding symptoms of recurrence and to notify us sooner if these develop.  4/ CA 125 pending.  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 Patient desires trying estrogen. Vivelle dot prescribed. Discussed increased risk of breast cancer with prolonged use >5years.  HPI: Alyssa [name] is a 56 year old G3 P3 who is seen in consultation at the request of Dr. Marin [name] for a 26 cm pelvic mass and elevated CA-125. The patient began noting vague abdominal fullness and distention since December 2015. When she was evaluated by her local ER at [facility] a CT of the abdomen and pelvis was obtained on 07/28/2014. This revealed a large (26 a 12 cm) cystic mass with enhancing septations and heterogeneous cystic spaces and peripheral nodular soft tissue component. It appeared to be arising from the left ovary. It occupied the entire mid pelvis. There was deviation of the otherwise normal appearing uterus. There was no ascites lymphadenopathy perineal carcinomatosis or omental cake identified. She was seen by Dr. Armandina [name] this further evaluated her with a CA-125 which was mildly elevated at 68. CEA was normal. Given the potential  for underlying malignancy she was sent for my evaluation and consultation.  The patient is otherwise healthy woman. She denies abdominal pain, early satiety, or weight loss. Her only prior abdominal surgery is a tubal ligation. She has also undergone endometrial ablation. The family history is not concerning for increased risk for malignancy (her mother had dual cancers of vulva and lung but was a heavy smoker). She is neck for performance status. She is premenopausal and continues to have regular menses with no intermenstrual bleeding.  She has a history of an ASCUS pap 3 years ago with negative hr HPV testing at that time.  She underwent TAH, BSO lymphadenectomy, omentectomy with Dr Alyssa [name] at [facility] on 08/11/14. 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 The primary tumor was 25 cm, no LDS I, 0 of 16 nodes involved , the tumor was intestinal type with expansile patent and associated with a mucinous borderline tumor of the ovary.  She went on to receive 6 cycles of adjuvant carboplatin and paclitaxel chemotherapy (from 08/31/14 to 12/14/14).  CA 125 at completion of therapy in June, 2016 was 13.   CT chest/abdo/pelvis on 01/17/15 : Status post total abdominal hysterectomy and bilateral salpingo-oophorectomy. Previously noted large pelvis mass has been resected. However, today's study does demonstrate a 6.0 x 4.7 x 7.1 cm well-defined low-attenuation lesion along the left pelvic sidewall (image 71 of series 2), suspicious for local recurrence of disease. No other definite cystic or solid pelvic mass is identified. No definite peritoneal nodularity.   03/03/15  Followup CT: showed reduction in size of size of left pelvic lymphocyst.  CA 125 on 06/18/16 was stable at 10.  Interval History: she feels well with no complaints and no ongoing symptoms of chemotherapy. No symptoms from lymphocyst compression. She has no libido and this  is distressing to her.  Current Meds:  Outpatient Encounter Prescriptions as of 09/03/2016  Medication Sig  . amLODipine (NORVASC) 5 MG tablet TAKE 1 TABLET BY MOUTH  DAILY  . ibuprofen (ADVIL,MOTRIN) 600 MG tablet Take 600 mg by mouth every 6 (six) hours as needed. Reported on 12/01/2015  . losartan (COZAAR) 50 MG tablet TAKE 1 TABLET BY MOUTH  DAILY  . omeprazole (PRILOSEC) 20 MG capsule Take 1 capsule (20 mg total) by mouth daily.  Marland Kitchen UNABLE TO FIND daily. Amberen  . estradiol (VIVELLE-DOT) 0.025 MG/24HR Place 1 patch onto the skin 2 (two) times a week.  . [DISCONTINUED] oseltamivir (TAMIFLU) 75 MG capsule Take 1 capsule (75 mg total) by mouth daily.   Facility-Administered Encounter Medications as of 09/03/2016  Medication  . sodium chloride 0.9 % 1,000 mL with potassium chloride 10 mEq infusion    Allergy:  Allergies  Allergen Reactions  . Codeine Itching  . Doxycycline     Per pt, she gets chest pain from doxycycline  . Penicillins Nausea And Vomiting    Social Hx:   Social History   Social History  . Marital status: Married    Spouse name: N/A  . Number of children: 3  . Years of education: N/A   Occupational History  . Not on file.   Social History Main Topics  . Smoking status: Never Smoker  . Smokeless tobacco: Never Used  . Alcohol use No  . Drug use: No  . Sexual activity: Yes   Other Topics Concern  . Not on file   Social History Narrative  . No narrative on file    Past Surgical Hx:  Past Surgical History:  Procedure Laterality Date  . ABDOMINAL HYSTERECTOMY    . APPENDECTOMY     with hysterectomy  . ENDOMETRIAL ABLATION  2002  . TUBAL LIGATION  1991    Past Medical Hx:  Past Medical History:  Diagnosis Date  . Ovarian cancer ([place]) 08/2014   mucinous ovarian cancer    Past Gynecological History:  G3P3 (SVD's), tubal ligation. LMP in December 2015.  No LMP recorded. Patient has had a hysterectomy.  Family Hx:  Family History  Problem  Relation Age of Onset  . Cancer Mother 49    vulvar cancer  . Lung cancer Mother 33  . Stroke Father   . Prostate cancer Maternal Uncle   . Stroke Maternal Grandmother   . Prostate cancer Maternal Grandfather   . Cervical cancer Paternal Grandmother   . Throat cancer Paternal Grandfather   . Prostate cancer Maternal Uncle     Review of Systems:  Constitutional  Feels well,    ENT Normal appearing ears and nares bilaterally Skin/Breast  No rash, sores, jaundice, itching, dryness Cardiovascular  No chest pain, shortness of breath, or edema  Pulmonary  No cough or wheeze.  Gastro Intestinal  No nausea, vomitting, or diarrhoea. No bright red blood per rectum, no abdominal pain, change in bowel movement, or constipation.  Genito Urinary  No frequency, urgency, dysuria, see HPI Musculo Skeletal  No myalgia, arthralgia, joint swelling or pain  Neurologic  No weakness, numbness, change in gait,  Psychology  No depression, anxiety, insomnia.  Vitals:  Blood pressure 128/82, pulse 92, temperature 98.2 F (36.8 C), temperature source Oral, resp. rate 18, height 5\' 2"  (1.575 m), weight 198 lb 3.2 oz (89.9 kg), SpO2 100 %.  Physical Exam: WD in NAD Neck  Supple NROM, without any enlargements.  Lymph Node Survey No cervical supraclavicular or inguinal adenopathy Cardiovascular  Pulse normal rate, regularity and rhythm. S1 and S2 normal.  Lungs  Clear to auscultation bilateraly, without wheezes/crackles/rhonchi. Good air movement.  Skin  No rash/lesions/breakdown  Psychiatry  Alert and oriented to person, place, and time  Abdomen  Normoactive bowel sounds, abdomen soft, non-tender and mildly overweight without evidence of hernia. No palpable masses Back No CVA tenderness Genito Urinary  Vulva/vagina: Normal external female genitalia.  No lesions. No discharge or bleeding.  Bladder/urethra:  No lesions or masses, well supported bladder  Vagina: regular with no  lesions  Cervix: surgically absent  Uterus: surgically absent  Adnexa:no longer able to palpate cystic mass in left pelvis Rectal  Good tone, no masses no cul de sac nodularity.  Extremities  No bilateral cyanosis, clubbing or edema.   Donaciano Eva, MD   09/03/2016, 3:00 PM

## 2016-09-03 ENCOUNTER — Other Ambulatory Visit: Payer: BLUE CROSS/BLUE SHIELD

## 2016-09-03 ENCOUNTER — Encounter: Payer: Self-pay | Admitting: Gynecologic Oncology

## 2016-09-03 ENCOUNTER — Ambulatory Visit: Payer: BLUE CROSS/BLUE SHIELD | Attending: Gynecologic Oncology | Admitting: Gynecologic Oncology

## 2016-09-03 VITALS — BP 128/82 | HR 92 | Temp 98.2°F | Resp 18 | Ht 62.0 in | Wt 198.2 lb

## 2016-09-03 DIAGNOSIS — Z90722 Acquired absence of ovaries, bilateral: Secondary | ICD-10-CM | POA: Diagnosis not present

## 2016-09-03 DIAGNOSIS — Z9221 Personal history of antineoplastic chemotherapy: Secondary | ICD-10-CM | POA: Insufficient documentation

## 2016-09-03 DIAGNOSIS — Z90710 Acquired absence of both cervix and uterus: Secondary | ICD-10-CM | POA: Insufficient documentation

## 2016-09-03 DIAGNOSIS — E8940 Asymptomatic postprocedural ovarian failure: Secondary | ICD-10-CM

## 2016-09-03 DIAGNOSIS — Z8543 Personal history of malignant neoplasm of ovary: Secondary | ICD-10-CM | POA: Diagnosis not present

## 2016-09-03 DIAGNOSIS — Z801 Family history of malignant neoplasm of trachea, bronchus and lung: Secondary | ICD-10-CM | POA: Insufficient documentation

## 2016-09-03 DIAGNOSIS — R6882 Decreased libido: Secondary | ICD-10-CM

## 2016-09-03 DIAGNOSIS — Z823 Family history of stroke: Secondary | ICD-10-CM | POA: Insufficient documentation

## 2016-09-03 DIAGNOSIS — R55 Syncope and collapse: Secondary | ICD-10-CM

## 2016-09-03 DIAGNOSIS — C562 Malignant neoplasm of left ovary: Secondary | ICD-10-CM

## 2016-09-03 DIAGNOSIS — Z08 Encounter for follow-up examination after completed treatment for malignant neoplasm: Secondary | ICD-10-CM | POA: Diagnosis not present

## 2016-09-03 DIAGNOSIS — R1900 Intra-abdominal and pelvic swelling, mass and lump, unspecified site: Secondary | ICD-10-CM | POA: Diagnosis not present

## 2016-09-03 DIAGNOSIS — Z8049 Family history of malignant neoplasm of other genital organs: Secondary | ICD-10-CM | POA: Diagnosis not present

## 2016-09-03 DIAGNOSIS — Z880 Allergy status to penicillin: Secondary | ICD-10-CM | POA: Diagnosis not present

## 2016-09-03 DIAGNOSIS — Z79899 Other long term (current) drug therapy: Secondary | ICD-10-CM | POA: Diagnosis not present

## 2016-09-03 DIAGNOSIS — Z881 Allergy status to other antibiotic agents status: Secondary | ICD-10-CM | POA: Diagnosis not present

## 2016-09-03 DIAGNOSIS — N9489 Other specified conditions associated with female genital organs and menstrual cycle: Secondary | ICD-10-CM

## 2016-09-03 DIAGNOSIS — Z885 Allergy status to narcotic agent status: Secondary | ICD-10-CM | POA: Diagnosis not present

## 2016-09-03 MED ORDER — ESTRADIOL 0.025 MG/24HR TD PTTW: 1.0000 | MEDICATED_PATCH | TRANSDERMAL | 12 refills | Status: DC

## 2016-09-03 NOTE — Patient Instructions (Signed)
Call in July to set up your appointment for September with Dr. Denman George. Dr. Serita Grit office will contact you with the lab results when available. Dr. Denman George sent in the prescription for the Esradiol patch to your pharmacy.

## 2016-09-04 ENCOUNTER — Telehealth: Payer: Self-pay

## 2016-09-04 LAB — CA 125: Cancer Antigen (CA) 125: 8.6 U/mL (ref 0.0–38.1)

## 2016-09-04 NOTE — Telephone Encounter (Signed)
Told Ms Lallo that her marker was WNL at 8.6 as noted below by Dr. Denman George.

## 2016-09-04 NOTE — Telephone Encounter (Signed)
-----   Message from Everitt Amber, MD sent at 09/04/2016  7:19 AM EST ----- Can you please let Beyza know that her tumor marker is normal

## 2016-09-12 ENCOUNTER — Other Ambulatory Visit: Payer: Self-pay | Admitting: Internal Medicine

## 2016-10-04 DIAGNOSIS — R7303 Prediabetes: Secondary | ICD-10-CM | POA: Insufficient documentation

## 2016-10-04 NOTE — Patient Instructions (Addendum)
  Test(s) ordered today. Your results will be released to [facility] (or called to you) after review, usually within 72hours after test completion. If any changes need to be made, you will be notified at that same time.  All other Health Maintenance issues reviewed.   All recommended immunizations and age-appropriate screenings are up-to-date or discussed.  No immunizations administered today.   Medications reviewed and updated.  No changes recommended at this time.   A referral was ordered for GI for a colonoscopy  Please followup in 6 months for a physical

## 2016-10-04 NOTE — Progress Notes (Signed)
Subjective:    Patient ID: Alyssa [name], female    DOB: 13-Nov-1960, 56 y.o.   MRN: [id number]  HPI The patient is here for follow up.  Hypertension: She is taking her medication daily. She is compliant with a low sodium diet.  She denies chest pain, palpitations, edema, shortness of breath and regular headaches. She is not exercising regularly.      GERD:  She is taking her medication daily as prescribed.  She denies any GERD symptoms and feels her GERD is well controlled.   Her urine is cloudy for about 4 days.  She is concerned about an infection.  she has increased urinary frequncy earlier this week.  She denies dysuria.  Joint pain, back pain:   This is chronic and is worse with changes in weather and certain activities.  She takes advil, which helps.  Her hands, elbows, knees and feet.   Her DIP joints do not hurt.  Her back hurts as well.  Her sister was diagnosed with RA, but not at a level of needed treatment - she also has lung cancer and she is not sure if she needed treatment for the RA or not.    Medications and allergies reviewed with patient and updated if appropriate.  Patient Active Problem List   Diagnosis Date Noted  . Hyperglycemia 10/04/2016  . Right hip pain 06/19/2016  . 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 Family history of prostate cancer 12/01/2015  . Esophageal reflux 09/15/2015  . Obesity (BMI 30-39.9) 06/03/2015  . Essential hypertension 11/11/2014  . Chemotherapy-induced peripheral neuropathy ([place]) 10/18/2014  . Vasomotor instability 10/05/2014  . Chemotherapy induced neutropenia ([place]) 10/05/2014  . 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 Surgical menopause 08/24/2014  . Ovarian cancer ([place]) 08/02/2014    Current Outpatient Prescriptions on File Prior to Visit  Medication Sig Dispense Refill  . amLODipine (NORVASC) 5 MG tablet TAKE 1 TABLET BY MOUTH  DAILY 90 tablet 1  . ibuprofen (ADVIL,MOTRIN)  600 MG tablet Take 600 mg by mouth every 6 (six) hours as needed. Reported on 12/01/2015    . losartan (COZAAR) 50 MG tablet TAKE 1 TABLET BY MOUTH  DAILY 90 tablet 1  . omeprazole (PRILOSEC) 20 MG capsule Take 1 capsule (20 mg total) by mouth daily. 90 capsule 3   No current facility-administered medications on file prior to visit.     Past Medical History:  Diagnosis Date  . Ovarian cancer ([place]) 08/2014   mucinous ovarian cancer    Past Surgical History:  Procedure Laterality Date  . ABDOMINAL HYSTERECTOMY    . APPENDECTOMY     with hysterectomy  . ENDOMETRIAL ABLATION  2002  . TUBAL LIGATION  1991    Social History   Social History  . Marital status: Married    Spouse name: N/A  . Number of children: 3  . Years of education: N/A   Social History Main Topics  . Smoking status: Never Smoker  . Smokeless tobacco: Never Used  . Alcohol use No  . Drug use: No  . Sexual activity: Yes   Other Topics Concern  . None   Social History Narrative  . None    Family History  Problem Relation Age of Onset  . Cancer Mother 66    vulvar cancer  . Lung cancer Mother 84  . Stroke Father   . Prostate cancer Maternal Uncle   . Stroke Maternal Grandmother   .  Prostate cancer Maternal Grandfather   . Cervical cancer Paternal Grandmother   . Throat cancer Paternal Grandfather   . Prostate cancer Maternal Uncle     Review of Systems  Constitutional: Negative for chills and fever.  Respiratory: Negative for cough, shortness of breath and wheezing.   Cardiovascular: Positive for palpitations (occ). Negative for chest pain and leg swelling.  Gastrointestinal: Negative for abdominal pain and nausea.  Genitourinary: Positive for frequency. Negative for dysuria and hematuria.  Neurological: Negative for light-headedness and headaches.       Objective:   Vitals:   10/05/16 0806  BP: 128/80  Pulse: 71  Resp: 16  Temp: 97.9 F (36.6 C)   Wt Readings from Last 3 Encounters:   10/05/16 200 lb (90.7 kg)  09/03/16 198 lb 3.2 oz (89.9 kg)  06/18/16 201 lb 9.6 oz (91.4 kg)   Body mass index is 36.58 kg/m.   Physical Exam    Constitutional: Appears well-developed and well-nourished. No distress.  HENT:  Head: Normocephalic and atraumatic.  Neck: Neck supple. No tracheal deviation present. No thyromegaly present.  No cervical lymphadenopathy Cardiovascular: Normal rate, regular rhythm and normal heart sounds.   No murmur heard. No carotid bruit .  No edema Pulmonary/Chest: Effort normal and breath sounds normal. No respiratory distress. No has no wheezes. No rales.  Msk: no swollen joints, slight angling of second MCP joints, mild nodules PIP joints on hands Skin: Skin is warm and dry. Not diaphoretic.  Psychiatric: Normal mood and affect. Behavior is normal.      Assessment & Plan:    See Problem List for Assessment and Plan of chronic medical problems.

## 2016-10-04 NOTE — Assessment & Plan Note (Signed)
Check a1c 

## 2016-10-05 ENCOUNTER — Other Ambulatory Visit (INDEPENDENT_AMBULATORY_CARE_PROVIDER_SITE_OTHER): Payer: BLUE CROSS/BLUE SHIELD

## 2016-10-05 ENCOUNTER — Ambulatory Visit (INDEPENDENT_AMBULATORY_CARE_PROVIDER_SITE_OTHER): Payer: BLUE CROSS/BLUE SHIELD | Admitting: Internal Medicine

## 2016-10-05 ENCOUNTER — Encounter: Payer: Self-pay | Admitting: Internal Medicine

## 2016-10-05 VITALS — BP 128/80 | HR 71 | Temp 97.9°F | Resp 16 | Wt 200.0 lb

## 2016-10-05 DIAGNOSIS — M2550 Pain in unspecified joint: Secondary | ICD-10-CM | POA: Diagnosis not present

## 2016-10-05 DIAGNOSIS — Z1211 Encounter for screening for malignant neoplasm of colon: Secondary | ICD-10-CM

## 2016-10-05 DIAGNOSIS — I10 Essential (primary) hypertension: Secondary | ICD-10-CM | POA: Diagnosis not present

## 2016-10-05 DIAGNOSIS — R739 Hyperglycemia, unspecified: Secondary | ICD-10-CM

## 2016-10-05 DIAGNOSIS — K219 Gastro-esophageal reflux disease without esophagitis: Secondary | ICD-10-CM

## 2016-10-05 DIAGNOSIS — R8290 Unspecified abnormal findings in urine: Secondary | ICD-10-CM | POA: Insufficient documentation

## 2016-10-05 LAB — URINALYSIS, ROUTINE W REFLEX MICROSCOPIC
Bilirubin Urine: NEGATIVE
Hgb urine dipstick: NEGATIVE
KETONES UR: NEGATIVE
Leukocytes, UA: NEGATIVE
Nitrite: NEGATIVE
PH: 6 (ref 5.0–8.0)
RBC / HPF: NONE SEEN (ref 0–?)
SPECIFIC GRAVITY, URINE: 1.015 (ref 1.000–1.030)
Total Protein, Urine: NEGATIVE
URINE GLUCOSE: NEGATIVE
UROBILINOGEN UA: 0.2 (ref 0.0–1.0)
WBC UA: NONE SEEN (ref 0–?)

## 2016-10-05 LAB — COMPREHENSIVE METABOLIC PANEL
ALT: 22 U/L (ref 0–35)
AST: 21 U/L (ref 0–37)
Albumin: 4.1 g/dL (ref 3.5–5.2)
Alkaline Phosphatase: 89 U/L (ref 39–117)
BILIRUBIN TOTAL: 0.3 mg/dL (ref 0.2–1.2)
BUN: 18 mg/dL (ref 6–23)
CO2: 29 meq/L (ref 19–32)
CREATININE: 1.1 mg/dL (ref 0.40–1.20)
Calcium: 9.3 mg/dL (ref 8.4–10.5)
Chloride: 104 mEq/L (ref 96–112)
GFR: 55 mL/min — ABNORMAL LOW (ref >60.00)
GLUCOSE: 73 mg/dL (ref 70–99)
Potassium: 3.9 mEq/L (ref 3.5–5.1)
SODIUM: 139 meq/L (ref 135–145)
TOTAL PROTEIN: 7.2 g/dL (ref 6.0–8.3)

## 2016-10-05 LAB — CBC WITH DIFFERENTIAL/PLATELET
BASOS PCT: 1.3 % (ref 0.0–3.0)
Basophils Absolute: 0 10*3/uL (ref 0.0–0.1)
EOS ABS: 0.2 10*3/uL (ref 0.0–0.7)
EOS PCT: 6 % — AB (ref 0.0–5.0)
HCT: 41.7 % (ref 36.0–46.0)
Hemoglobin: 14.2 g/dL (ref 12.0–15.0)
Lymphocytes Relative: 23.5 % (ref 12.0–46.0)
Lymphs Abs: 0.8 10*3/uL (ref 0.7–4.0)
MCHC: 33.9 g/dL (ref 30.0–36.0)
MCV: 91 fl (ref 78.0–100.0)
MONO ABS: 0.2 10*3/uL (ref 0.1–1.0)
Monocytes Relative: 6.4 % (ref 3.0–12.0)
NEUTROS ABS: 2.2 10*3/uL (ref 1.4–7.7)
Neutrophils Relative %: 62.8 % (ref 43.0–77.0)
PLATELETS: 234 10*3/uL (ref 150.0–400.0)
RBC: 4.59 Mil/uL (ref 3.87–5.11)
RDW: 13.5 % (ref 11.5–15.5)
WBC: 3.5 10*3/uL — AB (ref 4.0–10.5)

## 2016-10-05 LAB — HEMOGLOBIN A1C: Hgb A1c MFr Bld: 5.8 % (ref 4.6–6.5)

## 2016-10-05 LAB — C-REACTIVE PROTEIN: CRP: 0.4 mg/dL — ABNORMAL LOW (ref 0.5–20.0)

## 2016-10-05 LAB — SEDIMENTATION RATE: SED RATE: 28 mm/h (ref 0–30)

## 2016-10-05 NOTE — Assessment & Plan Note (Signed)
GERD controlled Zantac / pepcid  - not effective Continue daily medication

## 2016-10-05 NOTE — Assessment & Plan Note (Signed)
Sister has RA Joint pain - OA vs RA Check labs Tylenol alt with advil

## 2016-10-05 NOTE — Assessment & Plan Note (Signed)
BP well controlled Current regimen effective and well tolerated Continue current medications at current doses cmp  

## 2016-10-05 NOTE — Progress Notes (Signed)
Pre visit review using our clinic review tool, if applicable. No additional management support is needed unless otherwise documented below in the visit note. 

## 2016-10-05 NOTE — Assessment & Plan Note (Signed)
Check UA, UCx to rule out infection

## 2016-10-06 LAB — URINE CULTURE: ORGANISM ID, BACTERIA: NO GROWTH

## 2016-10-08 LAB — RHEUMATOID FACTOR

## 2016-10-08 LAB — ANA: Anti Nuclear Antibody(ANA): NEGATIVE

## 2016-10-10 ENCOUNTER — Telehealth: Payer: Self-pay | Admitting: Internal Medicine

## 2016-10-10 NOTE — Telephone Encounter (Signed)
Patient requesting call back in regard to lab results.  If calling before 12 please call at work at the phone number selected for phone note.  After 12 you can reach patient at [phone].  Patient states you can try cell first if before 12 but then try work number.

## 2016-10-11 NOTE — Telephone Encounter (Signed)
Please Advise, I see that labs are NOT visible to pt.

## 2016-10-11 NOTE — Telephone Encounter (Signed)
Pt called and said that the labs were relaeased to her through MyChart but she did not see any notes from Dr Quay Burow with an explanation of the labs. Please advise.

## 2016-10-11 NOTE — Telephone Encounter (Signed)
See result note.  

## 2016-10-12 NOTE — Telephone Encounter (Signed)
Spoke with pt on 10/11/2016

## 2016-10-17 ENCOUNTER — Other Ambulatory Visit: Payer: Self-pay | Admitting: Internal Medicine

## 2016-11-19 DIAGNOSIS — L7211 Pilar cyst: Secondary | ICD-10-CM | POA: Diagnosis not present

## 2016-11-27 ENCOUNTER — Encounter: Payer: Self-pay | Admitting: Internal Medicine

## 2016-12-03 DIAGNOSIS — L7211 Pilar cyst: Secondary | ICD-10-CM | POA: Diagnosis not present

## 2016-12-08 DIAGNOSIS — K8000 Calculus of gallbladder with acute cholecystitis without obstruction: Secondary | ICD-10-CM | POA: Diagnosis not present

## 2016-12-08 DIAGNOSIS — F419 Anxiety disorder, unspecified: Secondary | ICD-10-CM | POA: Diagnosis not present

## 2016-12-08 DIAGNOSIS — K660 Peritoneal adhesions (postprocedural) (postinfection): Secondary | ICD-10-CM | POA: Diagnosis not present

## 2016-12-08 DIAGNOSIS — R1013 Epigastric pain: Secondary | ICD-10-CM | POA: Diagnosis not present

## 2016-12-08 DIAGNOSIS — Z8543 Personal history of malignant neoplasm of ovary: Secondary | ICD-10-CM | POA: Diagnosis not present

## 2016-12-08 DIAGNOSIS — K219 Gastro-esophageal reflux disease without esophagitis: Secondary | ICD-10-CM | POA: Diagnosis not present

## 2016-12-08 DIAGNOSIS — I10 Essential (primary) hypertension: Secondary | ICD-10-CM | POA: Diagnosis not present

## 2016-12-08 DIAGNOSIS — K8020 Calculus of gallbladder without cholecystitis without obstruction: Secondary | ICD-10-CM | POA: Diagnosis not present

## 2016-12-08 DIAGNOSIS — K8012 Calculus of gallbladder with acute and chronic cholecystitis without obstruction: Secondary | ICD-10-CM | POA: Diagnosis not present

## 2016-12-08 DIAGNOSIS — K8070 Calculus of gallbladder and bile duct without cholecystitis without obstruction: Secondary | ICD-10-CM | POA: Diagnosis not present

## 2016-12-08 DIAGNOSIS — K810 Acute cholecystitis: Secondary | ICD-10-CM | POA: Diagnosis not present

## 2016-12-08 DIAGNOSIS — R197 Diarrhea, unspecified: Secondary | ICD-10-CM | POA: Diagnosis not present

## 2016-12-08 DIAGNOSIS — Z880 Allergy status to penicillin: Secondary | ICD-10-CM | POA: Diagnosis not present

## 2016-12-08 DIAGNOSIS — R109 Unspecified abdominal pain: Secondary | ICD-10-CM | POA: Diagnosis not present

## 2016-12-08 DIAGNOSIS — Z8711 Personal history of peptic ulcer disease: Secondary | ICD-10-CM | POA: Diagnosis not present

## 2016-12-08 DIAGNOSIS — K8010 Calculus of gallbladder with chronic cholecystitis without obstruction: Secondary | ICD-10-CM | POA: Diagnosis not present

## 2016-12-09 DIAGNOSIS — K8010 Calculus of gallbladder with chronic cholecystitis without obstruction: Secondary | ICD-10-CM | POA: Diagnosis not present

## 2016-12-09 DIAGNOSIS — K8020 Calculus of gallbladder without cholecystitis without obstruction: Secondary | ICD-10-CM | POA: Diagnosis not present

## 2016-12-09 DIAGNOSIS — K810 Acute cholecystitis: Secondary | ICD-10-CM | POA: Diagnosis not present

## 2016-12-09 HISTORY — PX: LAPAROSCOPIC CHOLECYSTECTOMY: SUR755

## 2016-12-10 ENCOUNTER — Encounter: Payer: Self-pay | Admitting: Internal Medicine

## 2016-12-27 ENCOUNTER — Encounter: Payer: Self-pay | Admitting: Genetic Counselor

## 2016-12-27 DIAGNOSIS — Z1379 Encounter for other screening for genetic and chromosomal anomalies: Secondary | ICD-10-CM | POA: Insufficient documentation

## 2016-12-28 ENCOUNTER — Encounter: Payer: Self-pay | Admitting: Internal Medicine

## 2017-03-31 ENCOUNTER — Other Ambulatory Visit: Payer: Self-pay | Admitting: Internal Medicine

## 2017-04-05 ENCOUNTER — Ambulatory Visit: Payer: BLUE CROSS/BLUE SHIELD | Admitting: Internal Medicine

## 2017-04-09 ENCOUNTER — Encounter: Payer: Self-pay | Admitting: Gynecologic Oncology

## 2017-04-09 ENCOUNTER — Ambulatory Visit: Payer: BLUE CROSS/BLUE SHIELD | Admitting: Internal Medicine

## 2017-04-09 ENCOUNTER — Other Ambulatory Visit (HOSPITAL_BASED_OUTPATIENT_CLINIC_OR_DEPARTMENT_OTHER): Payer: BLUE CROSS/BLUE SHIELD

## 2017-04-09 ENCOUNTER — Ambulatory Visit: Payer: BLUE CROSS/BLUE SHIELD | Attending: Gynecologic Oncology | Admitting: Gynecologic Oncology

## 2017-04-09 VITALS — BP 123/83 | HR 78 | Temp 98.3°F | Resp 20 | Ht 62.0 in | Wt 190.8 lb

## 2017-04-09 DIAGNOSIS — Z9851 Tubal ligation status: Secondary | ICD-10-CM | POA: Diagnosis not present

## 2017-04-09 DIAGNOSIS — Z801 Family history of malignant neoplasm of trachea, bronchus and lung: Secondary | ICD-10-CM | POA: Diagnosis not present

## 2017-04-09 DIAGNOSIS — Z8049 Family history of malignant neoplasm of other genital organs: Secondary | ICD-10-CM | POA: Diagnosis not present

## 2017-04-09 DIAGNOSIS — R6882 Decreased libido: Secondary | ICD-10-CM | POA: Diagnosis not present

## 2017-04-09 DIAGNOSIS — Z79899 Other long term (current) drug therapy: Secondary | ICD-10-CM | POA: Insufficient documentation

## 2017-04-09 DIAGNOSIS — Z8543 Personal history of malignant neoplasm of ovary: Secondary | ICD-10-CM

## 2017-04-09 DIAGNOSIS — Z9221 Personal history of antineoplastic chemotherapy: Secondary | ICD-10-CM | POA: Insufficient documentation

## 2017-04-09 DIAGNOSIS — Z823 Family history of stroke: Secondary | ICD-10-CM | POA: Insufficient documentation

## 2017-04-09 DIAGNOSIS — Z9889 Other specified postprocedural states: Secondary | ICD-10-CM | POA: Diagnosis not present

## 2017-04-09 DIAGNOSIS — Z880 Allergy status to penicillin: Secondary | ICD-10-CM | POA: Diagnosis not present

## 2017-04-09 DIAGNOSIS — Z9049 Acquired absence of other specified parts of digestive tract: Secondary | ICD-10-CM | POA: Insufficient documentation

## 2017-04-09 DIAGNOSIS — Z808 Family history of malignant neoplasm of other organs or systems: Secondary | ICD-10-CM | POA: Insufficient documentation

## 2017-04-09 DIAGNOSIS — R1900 Intra-abdominal and pelvic swelling, mass and lump, unspecified site: Secondary | ICD-10-CM

## 2017-04-09 DIAGNOSIS — Z90710 Acquired absence of both cervix and uterus: Secondary | ICD-10-CM | POA: Diagnosis not present

## 2017-04-09 DIAGNOSIS — C562 Malignant neoplasm of left ovary: Secondary | ICD-10-CM | POA: Diagnosis not present

## 2017-04-09 LAB — CEA (IN HOUSE-CHCC): CEA (CHCC-In House): 1.86 ng/mL (ref 0.00–5.00)

## 2017-04-09 NOTE — Progress Notes (Signed)
Followup Note: Gyn-Onc   CC:  Chief Complaint  Patient presents with  . Carcinoma of left ovary [facility])    Assessment/Plan:  [name]  is a 56 y.o.  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 No evidence of recurrence on exam today.   1/ left pelvic mass on CT imaging:This appears most consistent with a lymphocyst. It is smooth and regular in appearnce and cystic in appearance. It is stable/decreasing on followup imaging. No additional surveillance is necessary.   2/ Followup: with me in 6 months as she is now 2 year s/p treatment.  3/ She was counseled regarding symptoms of recurrence and to notify us sooner if these develop.  4/ CA 125 pending.  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 Discussed increased risk of breast cancer with prolonged use >5years.  HPI: Alyssa [name] is a 56 year old G3 P3 who is seen in consultation at the request of Dr. Marin Roberts for a 26 cm pelvic mass and elevated CA-125. The patient began noting vague abdominal fullness and distention since December 2015. When she was evaluated by her local ER at [facility] a CT of the abdomen and pelvis was obtained on 07/28/2014. This revealed a large (26 a 12 cm) cystic mass with enhancing septations and heterogeneous cystic spaces and peripheral nodular soft tissue component. It appeared to be arising from the left ovary. It occupied the entire mid pelvis. There was deviation of the otherwise normal appearing uterus. There was no ascites lymphadenopathy perineal carcinomatosis or omental cake identified. She was seen by Dr. Armandina Stammer this further evaluated her with a CA-125 which was mildly elevated at 68. CEA was normal. Given the potential for underlying malignancy she was sent for my evaluation  and consultation.  The patient is otherwise healthy woman. She denies abdominal pain, early satiety, or weight loss. Her only prior abdominal surgery is a tubal ligation. She has also undergone endometrial ablation. The family history is not concerning for increased risk for malignancy (her mother had dual cancers of vulva and lung but was a heavy smoker). She is neck for performance status. She is premenopausal and continues to have regular menses with no intermenstrual bleeding.  She has a history of an ASCUS pap 3 years ago with negative hr HPV testing at that time.  She underwent TAH, BSO lymphadenectomy, omentectomy with Dr Kaleen Mask at [facility] on 08/11/14. 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 The primary tumor was 25 cm, no LDS I, 0 of 16 nodes involved , the tumor was intestinal type with expansile patent and associated with a mucinous borderline tumor of the ovary.  She went on to receive 6 cycles of adjuvant carboplatin and paclitaxel chemotherapy (from 08/31/14 to 12/14/14).  CA 125 at completion of therapy in June, 2016 was 13.   CT chest/abdo/pelvis on 01/17/15 : Status post total abdominal hysterectomy and bilateral salpingo-oophorectomy. Previously noted large pelvis mass has been resected. However, today's study does demonstrate a 6.0 x 4.7 x 7.1 cm well-defined low-attenuation lesion along the left pelvic sidewall (image 71 of series 2), suspicious for local recurrence of disease. No other definite cystic or solid pelvic mass is identified. No definite peritoneal nodularity.   03/03/15 Followup CT: showed reduction in size of  size of left pelvic lymphocyst.  CA 125 on 06/18/16 was stable at 10.  Interval History: she feels well with no complaints and no ongoing symptoms of chemotherapy. No symptoms from lymphocyst compression. S/p cholecystectomy in 2018.  Current Meds:  Outpatient Encounter Prescriptions  as of 04/09/2017  Medication Sig  . amLODipine (NORVASC) 5 MG tablet TAKE 1 TABLET BY MOUTH  DAILY  . amLODipine (NORVASC) 5 MG tablet TAKE 1 TABLET BY MOUTH  DAILY  . amLODipine (NORVASC) 5 MG tablet TAKE 1 TABLET BY MOUTH  DAILY  . estradiol (VIVELLE-DOT) 0.025 MG/24HR Place 1 patch onto the skin 2 (two) times a week.  Marland Kitchen ibuprofen (ADVIL,MOTRIN) 600 MG tablet Take 600 mg by mouth every 6 (six) hours as needed. Reported on 12/01/2015  . losartan (COZAAR) 50 MG tablet TAKE 1 TABLET BY MOUTH  DAILY  . losartan (COZAAR) 50 MG tablet TAKE 1 TABLET BY MOUTH  DAILY  . omeprazole (PRILOSEC) 20 MG capsule Take 1 capsule (20 mg total) by mouth daily.  Marland Kitchen losartan (COZAAR) 50 MG tablet TAKE 1 TABLET BY MOUTH  DAILY   No facility-administered encounter medications on file as of 04/09/2017.     Allergy:  Allergies  Allergen Reactions  . Codeine Itching  . Doxycycline     Per pt, she gets chest pain from doxycycline  . Penicillins Nausea And Vomiting    Social Hx:   Social History   Social History  . Marital status: Married    Spouse name: N/A  . Number of children: 3  . Years of education: N/A   Occupational History  . Not on file.   Social History Main Topics  . Smoking status: Never Smoker  . Smokeless tobacco: Never Used  . Alcohol use No  . Drug use: No  . Sexual activity: Yes   Other Topics Concern  . Not on file   Social History Narrative  . No narrative on file    Past Surgical Hx:  Past Surgical History:  Procedure Laterality Date  . ABDOMINAL HYSTERECTOMY    . APPENDECTOMY     with hysterectomy  . ENDOMETRIAL ABLATION  2002  . LAPAROSCOPIC CHOLECYSTECTOMY  12/09/2016  . TUBAL LIGATION  1991    Past Medical Hx:  Past Medical History:  Diagnosis Date  . Ovarian cancer ([place]) 08/2014   mucinous ovarian cancer    Past Gynecological History:  G3P3 (SVD's), tubal ligation. LMP in December 2015.  No LMP recorded. Patient has had a hysterectomy.  Family Hx:   Family History  Problem Relation Age of Onset  . Cancer Mother 33       vulvar cancer  . Lung cancer Mother 36  . Stroke Father   . Prostate cancer Maternal Uncle   . Stroke Maternal Grandmother   . Prostate cancer Maternal Grandfather   . Cervical cancer Paternal Grandmother   . Throat cancer Paternal Grandfather   . Prostate cancer Maternal Uncle     Review of Systems:  Constitutional  Feels well,    ENT Normal appearing ears and nares bilaterally Skin/Breast  No rash, sores, jaundice, itching, dryness Cardiovascular  No chest pain, shortness of breath, or edema  Pulmonary  No cough or wheeze.  Gastro Intestinal  No nausea, vomitting, or diarrhoea. No bright red blood per rectum, no abdominal pain, change in bowel movement, or constipation.  Genito Urinary  No frequency, urgency, dysuria, see HPI Musculo Skeletal  No myalgia, arthralgia, joint swelling or pain  Neurologic  No weakness, numbness, change in gait,  Psychology  No depression, anxiety, insomnia.   Vitals:  Blood pressure 123/83, pulse 78, temperature 98.3 F (36.8 C), temperature source Oral, resp. rate 20, height 5\' 2"  (1.575 m), weight 190 lb 12.8 oz (86.5 kg), SpO2 99 %.  Physical Exam: WD in NAD Neck  Supple NROM, without any enlargements.  Lymph Node Survey No cervical supraclavicular or inguinal adenopathy Cardiovascular  Pulse normal rate, regularity and rhythm. S1 and S2 normal.  Lungs  Clear to auscultation bilateraly, without wheezes/crackles/rhonchi. Good air movement.  Skin  No rash/lesions/breakdown  Psychiatry  Alert and oriented to person, place, and time  Abdomen  Normoactive bowel sounds, abdomen soft, non-tender and mildly overweight without evidence of hernia. No palpable masses Back No CVA tenderness Genito Urinary  Vulva/vagina: Normal external female genitalia.  No lesions. No discharge or bleeding.  Bladder/urethra:  No lesions or masses, well supported  bladder  Vagina: regular with no lesions  Cervix: surgically absent  Uterus: surgically absent  Adnexa:no longer able to palpate cystic mass in left pelvis Rectal  Good tone, no masses no cul de sac nodularity.  Extremities  No bilateral cyanosis, clubbing or edema.   Donaciano Eva, MD   04/09/2017, 2:35 PM

## 2017-04-09 NOTE — Patient Instructions (Addendum)
Please contact Dr Serita Grit office at [phone] for a follow-up appointment in April, 2019.  Please notify Dr Denman George at phone number [phone] if you notice vaginal bleeding, new pelvic or abdominal pains, bloating, feeling full easy, or a change in bladder or bowel function.

## 2017-04-10 ENCOUNTER — Telehealth: Payer: Self-pay | Admitting: Gynecologic Oncology

## 2017-04-10 LAB — CA 125: Cancer Antigen (CA) 125: 12.4 U/mL (ref 0.0–38.1)

## 2017-04-10 NOTE — Telephone Encounter (Signed)
Returned call to patient.  Advised she would be contacted with Dr. Serita Grit recommendations tomorrow.

## 2017-04-10 NOTE — Telephone Encounter (Signed)
Left message with CA 125 for patient.  Advised her the result would be reviewed with Dr. Denman George as well in case she would want a repeat a CA 125 sooner than planned.  Advised patient I would contact her.

## 2017-04-11 ENCOUNTER — Encounter: Payer: Self-pay | Admitting: Gastroenterology

## 2017-04-11 ENCOUNTER — Encounter: Payer: Self-pay | Admitting: Internal Medicine

## 2017-04-11 ENCOUNTER — Ambulatory Visit (INDEPENDENT_AMBULATORY_CARE_PROVIDER_SITE_OTHER): Payer: BLUE CROSS/BLUE SHIELD | Admitting: Internal Medicine

## 2017-04-11 VITALS — BP 124/84 | HR 99 | Temp 98.4°F | Resp 16 | Wt 192.0 lb

## 2017-04-11 DIAGNOSIS — K219 Gastro-esophageal reflux disease without esophagitis: Secondary | ICD-10-CM

## 2017-04-11 DIAGNOSIS — Z1211 Encounter for screening for malignant neoplasm of colon: Secondary | ICD-10-CM

## 2017-04-11 DIAGNOSIS — M2550 Pain in unspecified joint: Secondary | ICD-10-CM

## 2017-04-11 DIAGNOSIS — I10 Essential (primary) hypertension: Secondary | ICD-10-CM

## 2017-04-11 DIAGNOSIS — Z23 Encounter for immunization: Secondary | ICD-10-CM

## 2017-04-11 DIAGNOSIS — R7303 Prediabetes: Secondary | ICD-10-CM | POA: Diagnosis not present

## 2017-04-11 MED ORDER — RANITIDINE HCL 300 MG PO TABS: 300.0000 mg | ORAL_TABLET | Freq: Two times a day (BID) | ORAL | 1 refills | Status: DC

## 2017-04-11 NOTE — Assessment & Plan Note (Signed)
Hip pain, knee pain, hand pain Continue tylenol and icy hot Start tumeric and / or tart cherry  Restart regular exercise Work on weight loss Discussed seeing Dr Tamala Julian - she will call and make an appointment if no improvement

## 2017-04-11 NOTE — Assessment & Plan Note (Signed)
Check a1c Low sugar / carb diet Stressed regular exercise, weight loss  

## 2017-04-11 NOTE — Assessment & Plan Note (Signed)
GERD controlled Discussed concerns with omeprazole - try to change to zantac

## 2017-04-11 NOTE — Patient Instructions (Addendum)
Start taking vitamin d 1000 units.  Start tumeric.    Test(s) ordered today. Your results will be released to [place] (or called to you) after review, usually within 72hours after test completion. If any changes need to be made, you will be notified at that same time.  All other Health Maintenance issues reviewed.   All recommended immunizations and age-appropriate screenings are up-to-date or discussed.  Flu immunization administered today.   Medications reviewed and updated.  Changes include stopping the omeprazole and taking zantac 2/day and hopefully eventually 1/day.   Your prescription(s) have been submitted to your pharmacy. Please take as directed and contact our office if you believe you are having problem(s) with the medication(s).  A referral was ordered for GI for a colonoscopy.  Please followup in 6 months

## 2017-04-11 NOTE — Assessment & Plan Note (Signed)
BP well controlled Current regimen effective and well tolerated Continue current medications at current doses cmp  

## 2017-04-11 NOTE — Progress Notes (Signed)
Subjective:    Patient ID: Alyssa [name], female    DOB: 1960-12-23, 56 y.o.   MRN: [id number]  HPI The patient is here for follow up.  Hypertension: She is taking her medication daily. She is compliant with a low sodium diet.  She denies chest pain, palpitations, edema, shortness of breath and regular headaches. She is not exercising regularly.  She does not monitor her blood pressure at home.    GERD:  She is taking her medication daily as prescribed.  If she forgets the omeprazole she is symptomatic.    Prediabetes:  She is not compliant with a low sugar/carbohydrate diet.  She is not exercising regularly.  Left hip:  If she bends over it feels like it clicks and it hurts.  When she stands up and sometimes she can not come all the way back up it hurts and it feels like it needs to click back into place.  These symptoms are intermittent.  she also has knee pain and hand pain.  She is taking tylenol as needed and using icy hot.  She is taking less advil.     Medications and allergies reviewed with patient and updated if appropriate.  Patient Active Problem List   Diagnosis Date Noted  . Genetic testing 12/27/2016  . Abnormal urine odor 10/05/2016  . Arthralgia 10/05/2016  . Prediabetes 10/04/2016  . Right hip pain 06/19/2016  . 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 Family history of prostate cancer 12/01/2015  . Esophageal reflux 09/15/2015  . Obesity (BMI 30-39.9) 06/03/2015  . Essential hypertension 11/11/2014  . Chemotherapy-induced peripheral neuropathy ([place]) 10/18/2014  . Vasomotor instability 10/05/2014  . Chemotherapy induced neutropenia ([place]) 10/05/2014  . 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 Surgical menopause 08/24/2014  . Ovarian cancer ([place]) 08/02/2014    Current Outpatient Prescriptions on File Prior to Visit  Medication Sig Dispense Refill  . amLODipine (NORVASC) 5 MG tablet TAKE 1 TABLET BY MOUTH  DAILY  90 tablet 0  . estradiol (VIVELLE-DOT) 0.025 MG/24HR Place 1 patch onto the skin 2 (two) times a week.    Marland Kitchen ibuprofen (ADVIL,MOTRIN) 600 MG tablet Take 600 mg by mouth every 6 (six) hours as needed. Reported on 12/01/2015    . losartan (COZAAR) 50 MG tablet TAKE 1 TABLET BY MOUTH  DAILY 90 tablet 0  . omeprazole (PRILOSEC) 20 MG capsule Take 1 capsule (20 mg total) by mouth daily. 90 capsule 3   No current facility-administered medications on file prior to visit.     Past Medical History:  Diagnosis Date  . Ovarian cancer ([place]) 08/2014   mucinous ovarian cancer    Past Surgical History:  Procedure Laterality Date  . ABDOMINAL HYSTERECTOMY    . APPENDECTOMY     with hysterectomy  . ENDOMETRIAL ABLATION  2002  . LAPAROSCOPIC CHOLECYSTECTOMY  12/09/2016  . TUBAL LIGATION  1991    Social History   Social History  . Marital status: Married    Spouse name: N/A  . Number of children: 3  . Years of education: N/A   Social History Main Topics  . Smoking status: Never Smoker  . Smokeless tobacco: Never Used  . Alcohol use No  . Drug use: No  . Sexual activity: Yes   Other Topics Concern  . None   Social History Narrative  . None    Family History  Problem Relation Age of Onset  . Cancer Mother  39       vulvar cancer  . Lung cancer Mother 6  . Stroke Father   . Prostate cancer Maternal Uncle   . Stroke Maternal Grandmother   . Prostate cancer Maternal Grandfather   . Cervical cancer Paternal Grandmother   . Throat cancer Paternal Grandfather   . Prostate cancer Maternal Uncle     Review of Systems  Constitutional: Negative for chills and fever.  Respiratory: Negative for cough, shortness of breath and wheezing.   Cardiovascular: Negative for chest pain, palpitations and leg swelling.  Gastrointestinal: Negative for abdominal distention, abdominal pain, blood in stool, constipation and diarrhea.  Musculoskeletal: Positive for arthralgias.  Neurological: Negative  for light-headedness and headaches.       Objective:   Vitals:   04/11/17 1058  BP: 124/84  Pulse: 99  Resp: 16  Temp: 98.4 F (36.9 C)  SpO2: 98%   Wt Readings from Last 3 Encounters:  04/11/17 192 lb (87.1 kg)  04/09/17 190 lb 12.8 oz (86.5 kg)  10/05/16 200 lb (90.7 kg)   Body mass index is 35.12 kg/m.   Physical Exam    Constitutional: Appears well-developed and well-nourished. No distress.  HENT:  Head: Normocephalic and atraumatic.  Neck: Neck supple. No tracheal deviation present. No thyromegaly present.  No cervical lymphadenopathy Cardiovascular: Normal rate, regular rhythm and normal heart sounds.   No murmur heard. No carotid bruit .  No edema Pulmonary/Chest: Effort normal and breath sounds normal. No respiratory distress. No has no wheezes. No rales.  Skin: Skin is warm and dry. Not diaphoretic.  Psychiatric: Normal mood and affect. Behavior is normal.      Assessment & Plan:    See Problem List for Assessment and Plan of chronic medical problems.

## 2017-04-12 ENCOUNTER — Telehealth: Payer: Self-pay

## 2017-04-12 ENCOUNTER — Telehealth: Payer: Self-pay | Admitting: Gynecologic Oncology

## 2017-04-12 DIAGNOSIS — Z23 Encounter for immunization: Secondary | ICD-10-CM | POA: Diagnosis not present

## 2017-04-12 DIAGNOSIS — C569 Malignant neoplasm of unspecified ovary: Secondary | ICD-10-CM

## 2017-04-12 DIAGNOSIS — I10 Essential (primary) hypertension: Secondary | ICD-10-CM | POA: Diagnosis not present

## 2017-04-12 NOTE — Telephone Encounter (Signed)
Told Alyssa [name] that Dr. Denman George said that with her CA-125 slightly elevated and with in normal limits that she would like to repeat the tumor marker in 1 month.  Scheduled patient for labs on 05-06-17 at 1530. Will call patient with the results.

## 2017-04-12 NOTE — Telephone Encounter (Signed)
Left message for patient asking her to please call the office to arrange for a lab appt to recheck her CA 125 in one month.

## 2017-05-06 ENCOUNTER — Other Ambulatory Visit (HOSPITAL_BASED_OUTPATIENT_CLINIC_OR_DEPARTMENT_OTHER): Payer: BLUE CROSS/BLUE SHIELD

## 2017-05-06 DIAGNOSIS — C569 Malignant neoplasm of unspecified ovary: Secondary | ICD-10-CM | POA: Diagnosis not present

## 2017-05-06 DIAGNOSIS — D709 Neutropenia, unspecified: Secondary | ICD-10-CM | POA: Diagnosis not present

## 2017-05-07 LAB — CA 125: Cancer Antigen (CA) 125: 11.1 U/mL (ref 0.0–38.1)

## 2017-05-08 ENCOUNTER — Telehealth: Payer: Self-pay | Admitting: Gynecologic Oncology

## 2017-05-08 NOTE — Telephone Encounter (Signed)
Patient informed of CA 125 results.  She would like to have another CA 125 in three months.  Appt made.  She is to follow up in April 2019 or sooner if needed, or symptoms develop.

## 2017-05-10 NOTE — Telephone Encounter (Signed)
Repeat Ca-125 on 05-06-17 down to 11.1.  Dr. Denman George wants to draw another CA-125 in 3 months.  Joylene John, NP spoke with Ms Teters.

## 2017-05-15 ENCOUNTER — Ambulatory Visit (AMBULATORY_SURGERY_CENTER): Payer: Self-pay | Admitting: *Deleted

## 2017-05-15 ENCOUNTER — Other Ambulatory Visit: Payer: Self-pay

## 2017-05-15 VITALS — Ht 61.5 in | Wt 193.2 lb

## 2017-05-15 DIAGNOSIS — Z1211 Encounter for screening for malignant neoplasm of colon: Secondary | ICD-10-CM

## 2017-05-15 MED ORDER — SUPREP BOWEL PREP KIT 17.5-3.13-1.6 GM/177ML PO SOLN: 1.0000 | Freq: Once | ORAL | 0 refills | Status: AC

## 2017-05-15 NOTE — Progress Notes (Signed)
Patient denies any allergies to egg or soy products. Patient denies complications with anesthesia/sedation.  Patient denies oxygen use at home and denies diet medications. Pamphlet given on colonoscopy. 

## 2017-06-06 ENCOUNTER — Other Ambulatory Visit: Payer: Self-pay | Admitting: Gynecologic Oncology

## 2017-06-06 DIAGNOSIS — E8940 Asymptomatic postprocedural ovarian failure: Secondary | ICD-10-CM

## 2017-06-06 DIAGNOSIS — R55 Syncope and collapse: Secondary | ICD-10-CM

## 2017-06-06 DIAGNOSIS — N9489 Other specified conditions associated with female genital organs and menstrual cycle: Secondary | ICD-10-CM

## 2017-06-07 ENCOUNTER — Encounter: Payer: Self-pay | Admitting: Gastroenterology

## 2017-06-07 ENCOUNTER — Ambulatory Visit (AMBULATORY_SURGERY_CENTER): Payer: BLUE CROSS/BLUE SHIELD | Admitting: Gastroenterology

## 2017-06-07 VITALS — BP 112/72 | HR 81 | Temp 98.6°F | Resp 17 | Ht 62.0 in | Wt 192.0 lb

## 2017-06-07 DIAGNOSIS — Z1212 Encounter for screening for malignant neoplasm of rectum: Secondary | ICD-10-CM | POA: Diagnosis not present

## 2017-06-07 DIAGNOSIS — Z1211 Encounter for screening for malignant neoplasm of colon: Secondary | ICD-10-CM | POA: Diagnosis not present

## 2017-06-07 MED ORDER — SODIUM CHLORIDE 0.9 % IV SOLN: 500.0000 mL | Freq: Once | INTRAVENOUS | Status: DC

## 2017-06-07 NOTE — Progress Notes (Signed)
To recovery, report to RN, VSS. 

## 2017-06-07 NOTE — Patient Instructions (Signed)
YOU HAD AN ENDOSCOPIC PROCEDURE TODAY AT [facility]:   Refer to the procedure report that was given to you for any specific questions about what was found during the examination.  If the procedure report does not answer your questions, please call your gastroenterologist to clarify.  If you requested that your care partner not be given the details of your procedure findings, then the procedure report has been included in a sealed envelope for you to review at your convenience later.  YOU SHOULD EXPECT: Some feelings of bloating in the abdomen. Passage of more gas than usual.  Walking can help get rid of the air that was put into your GI tract during the procedure and reduce the bloating. If you had a lower endoscopy (such as a colonoscopy or flexible sigmoidoscopy) you may notice spotting of blood in your stool or on the toilet paper. If you underwent a bowel prep for your procedure, you may not have a normal bowel movement for a few days.  Please Note:  You might notice some irritation and congestion in your nose or some drainage.  This is from the oxygen used during your procedure.  There is no need for concern and it should clear up in a day or so.  SYMPTOMS TO REPORT IMMEDIATELY:   Following lower endoscopy (colonoscopy or flexible sigmoidoscopy):  Excessive amounts of blood in the stool  Significant tenderness or worsening of abdominal pains  Swelling of the abdomen that is new, acute  Fever of 100F or higher   For urgent or emergent issues, a gastroenterologist can be reached at any hour by calling [phone].   DIET:  We do recommend a small meal at first, but then you may proceed to your regular diet.  Drink plenty of fluids but you should avoid alcoholic beverages for 24 hours. Try to increase the fiber in your diet, and drink plenty of water.  ACTIVITY:  You should plan to take it easy for the rest of today and you should NOT DRIVE or use heavy machinery until  tomorrow (because of the sedation medicines used during the test).    FOLLOW UP: Our staff will call the number listed on your records the next business day following your procedure to check on you and address any questions or concerns that you may have regarding the information given to you following your procedure. If we do not reach you, we will leave a message.  However, if you are feeling well and you are not experiencing any problems, there is no need to return our call.  We will assume that you have returned to your regular daily activities without incident.  If any biopsies were taken you will be contacted by phone or by letter within the next 1-3 weeks.  Please call us at [phone] if you have not heard about the biopsies in 3 weeks.    SIGNATURES/CONFIDENTIALITY: You and/or your care partner have signed paperwork which will be entered into your electronic medical record.  These signatures attest to the fact that that the information above on your After Visit Summary has been reviewed and is understood.  Full responsibility of the confidentiality of this discharge information lies with you and/or your care-partner.  You will need another colonoscopy in 10 years.

## 2017-06-07 NOTE — Progress Notes (Signed)
Pt's states no medical or surgical changes since previsit or office visit. 

## 2017-06-07 NOTE — Op Note (Signed)
[place] Patient Name: Alyssa [name] Procedure Date: 06/07/2017 7:57 AM MRN: [id number] Endoscopist: [place]. Loletha Carrow , MD Age: 56 Referring MD:  Date of Birth: 1961-01-06 Gender: Female Account #: [id number] Procedure:                Colonoscopy Indications:              Screening for colorectal malignant neoplasm, This                            is the patient's first colonoscopy Medicines:                Monitored Anesthesia Care Procedure:                Pre-Anesthesia Assessment:                           - Prior to the procedure, a History and Physical                            was performed, and patient medications and                            allergies were reviewed. The patient's tolerance of                            previous anesthesia was also reviewed. The risks                            and benefits of the procedure and the sedation                            options and risks were discussed with the patient.                            All questions were answered, and informed consent                            was obtained. Prior Anticoagulants: The patient has                            taken no previous anticoagulant or antiplatelet                            agents. ASA Grade Assessment: II - A patient with                            mild systemic disease. After reviewing the risks                            and benefits, the patient was deemed in                            satisfactory condition to undergo the procedure.  After obtaining informed consent, the colonoscope                            was passed under direct vision. Throughout the                            procedure, the patient's blood pressure, pulse, and                            oxygen saturations were monitored continuously. The                            Colonoscope was introduced through the anus and                            advanced to the the cecum,  identified by                            appendiceal orifice and ileocecal valve. The                            colonoscopy was performed without difficulty. The                            patient tolerated the procedure well. The quality                            of the bowel preparation was excellent. The                            ileocecal valve, appendiceal orifice, and rectum                            were photographed. The quality of the bowel                            preparation was evaluated using the BBPS [facility]                            Bowel Preparation Scale) with scores of: Right                            Colon = 3, Transverse Colon = 3 and Left Colon = 3                            (entire mucosa seen well with no residual staining,                            small fragments of stool or opaque liquid). The                            total BBPS score equals 9. The bowel preparation  used was SUPREP. Scope In: 8:03:40 AM Scope Out: 8:15:26 AM Scope Withdrawal Time: 0 hours 9 minutes 1 second  Total Procedure Duration: 0 hours 11 minutes 46 seconds  Findings:                 The perianal and digital rectal examinations were                            normal.                           A few diverticula were found in the sigmoid colon.                           The exam was otherwise without abnormality on                            direct and retroflexion views. Complications:            No immediate complications. Estimated Blood Loss:     Estimated blood loss: none. Impression:               - Diverticulosis in the sigmoid colon.                           - The examination was otherwise normal on direct                            and retroflexion views.                           - No specimens collected. Recommendation:           - Patient has a contact number available for                            emergencies. The signs and symptoms of  potential                            delayed complications were discussed with the                            patient. Return to normal activities tomorrow.                            Written discharge instructions were provided to the                            patient.                           - Resume previous diet.                           - Continue present medications.                           - Repeat colonoscopy in 10 years for screening  purposes. Henry L. Loletha Carrow, MD 06/07/2017 8:18:03 AM This report has been signed electronically.

## 2017-06-11 ENCOUNTER — Ambulatory Visit: Payer: Self-pay | Admitting: *Deleted

## 2017-06-11 ENCOUNTER — Other Ambulatory Visit: Payer: Self-pay

## 2017-06-11 DIAGNOSIS — E8940 Asymptomatic postprocedural ovarian failure: Secondary | ICD-10-CM

## 2017-06-11 MED ORDER — ESTRADIOL 0.025 MG/24HR TD PTTW: 1.0000 | MEDICATED_PATCH | TRANSDERMAL | 11 refills | Status: DC

## 2017-06-11 NOTE — Telephone Encounter (Signed)
   Reason for Disposition . Cough  Answer Assessment - Initial Assessment Questions 1. ONSET: "When did the cough begin?"      Saturday 2. SEVERITY: "How bad is the cough today?"      Has coughing spells to the point she feels like she is going to gag 3. RESPIRATORY DISTRESS: "Describe your breathing."      Normal 4. FEVER: "Do you have a fever?" If so, ask: "What is your temperature, how was it measured, and when did it start?"     Ran a temp 5. SPUTUM: "Describe the color of your sputum" (clear, white, yellow, green)     Yellow 6. HEMOPTYSIS: "Are you coughing up any blood?" If so ask: "How much?" (flecks, streaks, tablespoons, etc.)     No 7. CARDIAC HISTORY: "Do you have any history of heart disease?" (e.g., heart attack, congestive heart failure)      No 8. LUNG HISTORY: "Do you have any history of lung disease?"  (e.g., pulmonary embolus, asthma, emphysema)     No 9. PE RISK FACTORS: "Do you have a history of blood clots?" (or: recent major surgery, recent prolonged travel, bedridden )     No 10. OTHER SYMPTOMS: "Do you have any other symptoms?" (e.g., runny nose, wheezing, chest pain)       Ear pain and sore throat on right side 11. PREGNANCY: "Is there any chance you are pregnant?" "When was your last menstrual period?"       No 12. TRAVEL: "Have you traveled out of the country in the last month?" (e.g., travel history, exposures)       No  Protocols used: COUGH - ACUTE PRODUCTIVE-A-AH  Pt states she has had current symptoms for about a week and has only taken 8 hour arthritis Tylenol and benadryl. Pt states she has Coricidin but has not taken it yet. Advised pt to try to take Coricidin to see if symptoms will improve. Advised pt if current symptoms get worse or do not improve to call the office back. Pt verbalized understanding.

## 2017-06-12 ENCOUNTER — Telehealth: Payer: Self-pay | Admitting: *Deleted

## 2017-06-12 NOTE — Telephone Encounter (Signed)
  Follow up Call-  Call back number 06/07/2017  Post procedure Call Back phone  # [phone]  Permission to leave phone message Yes  Some recent data might be hidden     No answer, left message

## 2017-06-12 NOTE — Telephone Encounter (Signed)
  Follow up Call-  Call back number 06/07/2017  Post procedure Call Back phone  # [phone]  Permission to leave phone message Yes  Some recent data might be hidden     No answer, left message

## 2017-06-20 ENCOUNTER — Other Ambulatory Visit: Payer: Self-pay | Admitting: Internal Medicine

## 2017-07-05 ENCOUNTER — Encounter: Payer: Self-pay | Admitting: Internal Medicine

## 2017-08-02 ENCOUNTER — Encounter: Payer: Self-pay | Admitting: Internal Medicine

## 2017-08-02 DIAGNOSIS — N838 Other noninflammatory disorders of ovary, fallopian tube and broad ligament: Secondary | ICD-10-CM

## 2017-08-05 MED ORDER — OMEPRAZOLE 20 MG PO CPDR: 20.0000 mg | DELAYED_RELEASE_CAPSULE | Freq: Every day | ORAL | 1 refills | Status: DC

## 2017-08-09 ENCOUNTER — Other Ambulatory Visit: Payer: BLUE CROSS/BLUE SHIELD

## 2017-08-23 ENCOUNTER — Telehealth: Payer: Self-pay | Admitting: *Deleted

## 2017-08-23 NOTE — Telephone Encounter (Signed)
Returned the patient's call and rescheduled the lab appt to March 1st.

## 2017-08-30 ENCOUNTER — Inpatient Hospital Stay: Payer: BLUE CROSS/BLUE SHIELD | Attending: Gynecologic Oncology

## 2017-08-30 DIAGNOSIS — Z8543 Personal history of malignant neoplasm of ovary: Secondary | ICD-10-CM | POA: Diagnosis not present

## 2017-08-30 DIAGNOSIS — C562 Malignant neoplasm of left ovary: Secondary | ICD-10-CM

## 2017-08-31 LAB — CA 125: Cancer Antigen (CA) 125: 10 U/mL (ref 0.0–38.1)

## 2017-09-02 ENCOUNTER — Telehealth: Payer: Self-pay

## 2017-09-02 NOTE — Telephone Encounter (Signed)
Told Alyssa [name] that the CA-125 was good and in normal range at 10.0. Scheduled follow up with Dr. Denman George for April 10th at 1515.

## 2017-10-01 ENCOUNTER — Encounter: Payer: Self-pay | Admitting: Gynecologic Oncology

## 2017-10-02 NOTE — Telephone Encounter (Signed)
Called and left the patient a message that Dr. Denman George doesn't need labs for aher appt on April 10th

## 2017-10-09 ENCOUNTER — Inpatient Hospital Stay: Payer: BLUE CROSS/BLUE SHIELD | Attending: Gynecologic Oncology | Admitting: Gynecologic Oncology

## 2017-10-09 ENCOUNTER — Encounter: Payer: Self-pay | Admitting: Gynecologic Oncology

## 2017-10-09 VITALS — BP 119/82 | HR 84 | Temp 98.2°F | Resp 20 | Ht 62.0 in | Wt 194.7 lb

## 2017-10-09 DIAGNOSIS — Z9221 Personal history of antineoplastic chemotherapy: Secondary | ICD-10-CM | POA: Diagnosis not present

## 2017-10-09 DIAGNOSIS — Z8543 Personal history of malignant neoplasm of ovary: Secondary | ICD-10-CM | POA: Diagnosis not present

## 2017-10-09 DIAGNOSIS — Z90710 Acquired absence of both cervix and uterus: Secondary | ICD-10-CM | POA: Diagnosis not present

## 2017-10-09 DIAGNOSIS — Z90722 Acquired absence of ovaries, bilateral: Secondary | ICD-10-CM | POA: Diagnosis not present

## 2017-10-09 DIAGNOSIS — C569 Malignant neoplasm of unspecified ovary: Secondary | ICD-10-CM

## 2017-10-09 NOTE — Progress Notes (Signed)
Followup Note: Gyn-Onc   CC:  Chief Complaint  Patient presents with  . Malignant neoplasm of ovary, unspecified laterality [facility])    Assessment/Plan:  [name]  is a 57 y.o.  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 No evidence of recurrence on exam today.   1/ left pelvic mass on CT imaging:This appears most consistent with a lymphocyst. It is smooth and regular in appearnce and cystic in appearance. It is stable/decreasing on followup imaging. No additional surveillance is necessary.   2/ Followup: with me in 6 months.  3/ She was counseled regarding symptoms of recurrence and to notify us sooner if these develop.  4/ CA 125 and CEA at next visit in 6 months.   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 Discussed increased risk of breast cancer with prolonged use >5years.  HPI: Alyssa [name] is a 57 year old G3 P3 who is seen in consultation at the request of Dr. Marin Roberts for a 26 cm pelvic mass and elevated CA-125. The patient began noting vague abdominal fullness and distention since December 2015. When she was evaluated by her local ER at [facility] a CT of the abdomen and pelvis was obtained on 07/28/2014. This revealed a large (26 a 12 cm) cystic mass with enhancing septations and heterogeneous cystic spaces and peripheral nodular soft tissue component. It appeared to be arising from the left ovary. It occupied the entire mid pelvis. There was deviation of the otherwise normal appearing uterus. There was no ascites lymphadenopathy perineal carcinomatosis or omental cake identified. She was seen by Dr. Armandina Stammer this further evaluated her with a CA-125 which was mildly elevated at 68. CEA was normal. Given the potential for underlying malignancy she was sent  for my evaluation and consultation.  The patient is otherwise healthy woman. She denies abdominal pain, early satiety, or weight loss. Her only prior abdominal surgery is a tubal ligation. She has also undergone endometrial ablation. The family history is not concerning for increased risk for malignancy (her mother had dual cancers of vulva and lung but was a heavy smoker). She is neck for performance status. She is premenopausal and continues to have regular menses with no intermenstrual bleeding.  She has a history of an ASCUS pap 3 years ago with negative hr HPV testing at that time.  She underwent TAH, BSO lymphadenectomy, omentectomy with Dr Kaleen Mask at [facility] on 08/11/14. 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 The primary tumor was 25 cm, no LDS I, 0 of 16 nodes involved , the tumor was intestinal type with expansile patent and associated with a mucinous borderline tumor of the ovary.  She went on to receive 6 cycles of adjuvant carboplatin and paclitaxel chemotherapy (from 08/31/14 to 12/14/14).  CA 125 at completion of therapy in June, 2016 was 13.   CT chest/abdo/pelvis on 01/17/15 : Status post total abdominal hysterectomy and bilateral salpingo-oophorectomy. Previously noted large pelvis mass has been resected. However, today's study does demonstrate a 6.0 x 4.7 x 7.1 cm well-defined low-attenuation lesion along the left pelvic sidewall (image 71 of series 2), suspicious for local recurrence of disease. No other definite cystic or solid pelvic mass is identified. No definite peritoneal nodularity.   03/03/15 Followup CT: showed reduction in  size of size of left pelvic lymphocyst.  CA 125 on 06/18/16 was stable at 10.  Interval History: she feels well with no complaints and no ongoing symptoms of chemotherapy. No symptoms from lymphocyst compression. S/p cholecystectomy in 2018.  Her CA 125 had increased to 12 in  November, 2018. It then decreased to 11 in December, 2018 and down to 10 in March, 2019.   Current Meds:  Outpatient Encounter Medications as of 10/09/2017  Medication Sig  . amLODipine (NORVASC) 5 MG tablet TAKE 1 TABLET BY MOUTH  DAILY  . diphenhydrAMINE (BENADRYL) 25 MG tablet Take 50 mg at bedtime by mouth.  . estradiol (VIVELLE-DOT) 0.025 MG/24HR APPLY 1 PATCH TWICE WEEKLY  . losartan (COZAAR) 50 MG tablet TAKE 1 TABLET BY MOUTH  DAILY  . omeprazole (PRILOSEC) 20 MG capsule Take 1 capsule (20 mg total) by mouth daily.  . [DISCONTINUED] estradiol (VIVELLE-DOT) 0.025 MG/24HR Place 1 patch onto the skin 2 (two) times a week.   Facility-Administered Encounter Medications as of 10/09/2017  Medication  . 0.9 %  sodium chloride infusion    Allergy:  Allergies  Allergen Reactions  . Codeine Itching  . Doxycycline     Per pt, she gets chest pain from doxycycline  . Penicillins Nausea And Vomiting    Social Hx:   Social History   Socioeconomic History  . Marital status: Married    Spouse name: Not on file  . Number of children: 3  . Years of education: Not on file  . Highest education level: Not on file  Occupational History  . Not on file  Social Needs  . Financial resource strain: Not on file  . Food insecurity:    Worry: Not on file    Inability: Not on file  . Transportation needs:    Medical: Not on file    Non-medical: Not on file  Tobacco Use  . Smoking status: Never Smoker  . Smokeless tobacco: Never Used  Substance and Sexual Activity  . Alcohol use: No  . Drug use: No  . Sexual activity: Yes    Birth control/protection: Post-menopausal    Comment: Hysterctomy  Lifestyle  . Physical activity:    Days per week: Not on file    Minutes per session: Not on file  . Stress: Not on file  Relationships  . Social connections:    Talks on phone: Not on file    Gets together: Not on file    Attends religious service: Not on file    Active member of club or  organization: Not on file    Attends meetings of clubs or organizations: Not on file    Relationship status: Not on file  . Intimate partner violence:    Fear of current or ex partner: Not on file    Emotionally abused: Not on file    Physically abused: Not on file    Forced sexual activity: Not on file  Other Topics Concern  . Not on file  Social History Narrative  . Not on file    Past Surgical Hx:  Past Surgical History:  Procedure Laterality Date  . ABDOMINAL HYSTERECTOMY    . APPENDECTOMY     with hysterectomy  . ENDOMETRIAL ABLATION  2002  . LAPAROSCOPIC CHOLECYSTECTOMY  12/09/2016  . TUBAL LIGATION  1991  . WISDOM TOOTH EXTRACTION      Past Medical Hx:  Past Medical History:  Diagnosis Date  . Arthritis    hands  .  GERD (gastroesophageal reflux disease)   . Hypertension   . Ovarian cancer ([place]) 08/2014   mucinous ovarian cancer  . SVD (spontaneous vaginal delivery)    x 3    Past Gynecological History:  G3P3 (SVD's), tubal ligation. LMP in December 2015.  No LMP recorded. Patient has had a hysterectomy.  Family Hx:  Family History  Problem Relation Age of Onset  . Cancer Mother 63       vulvar cancer  . Lung cancer Mother 92  . Stroke Father   . Prostate cancer Maternal Uncle   . Stroke Maternal Grandmother   . Prostate cancer Maternal Grandfather   . Cervical cancer Paternal Grandmother   . Throat cancer Paternal Grandfather   . Prostate cancer Maternal Uncle   . Colon cancer Neg Hx   . Rectal cancer Neg Hx   . Stomach cancer Neg Hx     Review of Systems:  Constitutional  Feels well,    ENT Normal appearing ears and nares bilaterally Skin/Breast  No rash, sores, jaundice, itching, dryness Cardiovascular  No chest pain, shortness of breath, or edema  Pulmonary  No cough or wheeze.  Gastro Intestinal  No nausea, vomitting, or diarrhoea. No bright red blood per rectum, no abdominal pain, change in bowel movement, or constipation.  Genito  Urinary  No frequency, urgency, dysuria, see HPI Musculo Skeletal  No myalgia, arthralgia, joint swelling or pain  Neurologic  No weakness, numbness, change in gait,  Psychology  No depression, anxiety, insomnia.   Vitals:  Blood pressure 119/82, pulse 84, temperature 98.2 F (36.8 C), temperature source Oral, resp. rate 20, height 5\' 2"  (1.575 m), weight 194 lb 11.2 oz (88.3 kg), SpO2 100 %.  Physical Exam: WD in NAD Neck  Supple NROM, without any enlargements.  Lymph Node Survey No cervical supraclavicular or inguinal adenopathy Cardiovascular  Pulse normal rate, regularity and rhythm. S1 and S2 normal.  Lungs  Clear to auscultation bilateraly, without wheezes/crackles/rhonchi. Good air movement.  Skin  No rash/lesions/breakdown  Psychiatry  Alert and oriented to person, place, and time  Abdomen  Normoactive bowel sounds, abdomen soft, non-tender and mildly overweight without evidence of hernia. No palpable masses Back No CVA tenderness Genito Urinary  Vulva/vagina: Normal external female genitalia.  No lesions. No discharge or bleeding.  Bladder/urethra:  No lesions or masses, well supported bladder  Vagina: regular with no lesions  Cervix: surgically absent  Uterus: surgically absent  Adnexa:no longer able to palpate cystic mass in left pelvis Rectal  Good tone, no masses no cul de sac nodularity.  Extremities  No bilateral cyanosis, clubbing or edema.   Thereasa Solo, MD   10/09/2017, 4:14 PM

## 2017-10-09 NOTE — Patient Instructions (Signed)
Please notify Dr Denman George at phone number [phone] if you notice vaginal bleeding, new pelvic or abdominal pains, bloating, feeling full easy, or a change in bladder or bowel function.   Please return to see Dr Denman George in October with labs and an exam.

## 2017-10-10 NOTE — Patient Instructions (Addendum)
  Test(s) ordered today. Your results will be released to MyChart (or called to you) after review, usually within 72hours after test completion. If any changes need to be made, you will be notified at that same time.  Medications reviewed and updated.  No changes recommended at this time.    Please followup in 6 months   

## 2017-10-10 NOTE — Progress Notes (Signed)
Subjective:    Patient ID: Alyssa [name], female    DOB: 1960/11/08, 57 y.o.   MRN: [id number]  HPI The patient is here for follow up.  Hypertension: She is taking her medication daily. She is compliant with a low sodium diet.  She denies chest pain, edema, shortness of breath and regular headaches. She is not exercising regularly.    GERD:  She is taking her medication daily as prescribed.  She denies any GERD symptoms and feels her GERD is well controlled.   Prediabetes:  She is not always compliant with a low sugar/carbohydrate diet.  She is not exercising regularly.  Hand pain:  She had autoimmune blood work that was negative one year ago.  She is taking advil.  All of her joints hurt and are stiff.  Her thumb joints are the worst.  Sacral region:  She has intermittent pain and when she bends over she often feels like something doesn't pop back into place as it should.  Today her pain feels okay, but this is been more of an intermittent, chronic issue.   Medications and allergies reviewed with patient and updated if appropriate.  Patient Active Problem List   Diagnosis Date Noted  . Genetic testing 12/27/2016  . Arthralgia 10/05/2016  . Prediabetes 10/04/2016  . Right hip pain 06/19/2016  . 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 Family history of prostate cancer 12/01/2015  . Esophageal reflux 09/15/2015  . Obesity (BMI 30-39.9) 06/03/2015  . Essential hypertension 11/11/2014  . Chemotherapy-induced peripheral neuropathy ([place]) 10/18/2014  . Vasomotor instability 10/05/2014  . Chemotherapy induced neutropenia ([place]) 10/05/2014  . 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 Surgical menopause 08/24/2014  . Ovarian cancer ([place]) 08/02/2014    Current Outpatient Medications on File Prior to Visit  Medication Sig Dispense Refill  . amLODipine (NORVASC) 5 MG tablet TAKE 1 TABLET BY MOUTH  DAILY 90 tablet 1  . diphenhydrAMINE  (BENADRYL) 25 MG tablet Take 50 mg at bedtime by mouth.    . estradiol (VIVELLE-DOT) 0.025 MG/24HR APPLY 1 PATCH TWICE WEEKLY 24 patch 11  . losartan (COZAAR) 50 MG tablet TAKE 1 TABLET BY MOUTH  DAILY 90 tablet 1  . omeprazole (PRILOSEC) 20 MG capsule Take 1 capsule (20 mg total) by mouth daily. 90 capsule 1   No current facility-administered medications on file prior to visit.     Past Medical History:  Diagnosis Date  . Arthritis    hands  . GERD (gastroesophageal reflux disease)   . Hypertension   . 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 SVD (spontaneous vaginal delivery)    x 3    Past Surgical History:  Procedure Laterality Date  . ABDOMINAL HYSTERECTOMY    . APPENDECTOMY     with hysterectomy  . ENDOMETRIAL ABLATION  2002  . LAPAROSCOPIC CHOLECYSTECTOMY  12/09/2016  . TUBAL LIGATION  1991  . WISDOM TOOTH EXTRACTION      Social History   Socioeconomic History  . Marital status: Married    Spouse name: Not on file  . Number of children: 3  . Years of education: Not on file  . Highest education level: Not on file  Occupational History  . Not on file  Social Needs  . Financial resource strain: Not on file  . Food insecurity:    Worry: Not on file    Inability: Not on file  . Transportation needs:  Medical: Not on file    Non-medical: Not on file  Tobacco Use  . Smoking status: Never Smoker  . Smokeless tobacco: Never Used  Substance and Sexual Activity  . Alcohol use: No  . Drug use: No  . Sexual activity: Yes    Birth control/protection: Post-menopausal    Comment: Hysterctomy  Lifestyle  . Physical activity:    Days per week: Not on file    Minutes per session: Not on file  . Stress: Not on file  Relationships  . Social connections:    Talks on phone: Not on file    Gets together: Not on file    Attends religious service: Not on file    Active member of club or organization: Not on file    Attends meetings of clubs or  organizations: Not on file    Relationship status: Not on file  Other Topics Concern  . Not on file  Social History Narrative  . Not on file    Family History  Problem Relation Age of Onset  . Cancer Mother 80       vulvar cancer  . Lung cancer Mother 16  . Stroke Father   . Prostate cancer Maternal Uncle   . Stroke Maternal Grandmother   . Prostate cancer Maternal Grandfather   . Cervical cancer Paternal Grandmother   . Throat cancer Paternal Grandfather   . Prostate cancer Maternal Uncle   . Colon cancer Neg Hx   . Rectal cancer Neg Hx   . Stomach cancer Neg Hx     Review of Systems  Constitutional: Negative for chills and fever.  Respiratory: Negative for cough, shortness of breath and wheezing.   Cardiovascular: Positive for palpitations. Negative for chest pain and leg swelling.  Musculoskeletal: Positive for back pain (lower back - intermittent).  Neurological: Positive for light-headedness (wave across). Negative for headaches.       Objective:   Vitals:   10/11/17 0851  BP: 120/82  Pulse: 75  Resp: 14  Temp: 98.7 F (37.1 C)  SpO2: 98%   BP Readings from Last 3 Encounters:  10/11/17 120/82  10/09/17 119/82  06/07/17 112/72   Wt Readings from Last 3 Encounters:  10/11/17 197 lb (89.4 kg)  10/09/17 194 lb 11.2 oz (88.3 kg)  06/07/17 192 lb (87.1 kg)   Body mass index is 36.03 kg/m.   Physical Exam    Constitutional: Appears well-developed and well-nourished. No distress.  HENT:  Head: Normocephalic and atraumatic.  Neck: Neck supple. No tracheal deviation present. No thyromegaly present.  No cervical lymphadenopathy Cardiovascular: Normal rate, regular rhythm and normal heart sounds.   No murmur heard. No carotid bruit .  No edema Pulmonary/Chest: Effort normal and breath sounds normal. No respiratory distress. No has no wheezes. No rales.  Skin: Skin is warm and dry. Not diaphoretic.  Psychiatric: Normal mood and affect. Behavior is normal.        Assessment & Plan:    See Problem List for Assessment and Plan of chronic medical problems.

## 2017-10-11 ENCOUNTER — Encounter: Payer: Self-pay | Admitting: Internal Medicine

## 2017-10-11 ENCOUNTER — Ambulatory Visit (INDEPENDENT_AMBULATORY_CARE_PROVIDER_SITE_OTHER): Payer: BLUE CROSS/BLUE SHIELD | Admitting: Internal Medicine

## 2017-10-11 ENCOUNTER — Other Ambulatory Visit (INDEPENDENT_AMBULATORY_CARE_PROVIDER_SITE_OTHER): Payer: BLUE CROSS/BLUE SHIELD

## 2017-10-11 VITALS — BP 120/82 | HR 75 | Temp 98.7°F | Resp 14 | Ht 62.0 in | Wt 197.0 lb

## 2017-10-11 DIAGNOSIS — M545 Low back pain, unspecified: Secondary | ICD-10-CM | POA: Insufficient documentation

## 2017-10-11 DIAGNOSIS — R7303 Prediabetes: Secondary | ICD-10-CM | POA: Diagnosis not present

## 2017-10-11 DIAGNOSIS — R002 Palpitations: Secondary | ICD-10-CM

## 2017-10-11 DIAGNOSIS — K219 Gastro-esophageal reflux disease without esophagitis: Secondary | ICD-10-CM

## 2017-10-11 DIAGNOSIS — M19049 Primary osteoarthritis, unspecified hand: Secondary | ICD-10-CM

## 2017-10-11 DIAGNOSIS — I10 Essential (primary) hypertension: Secondary | ICD-10-CM

## 2017-10-11 LAB — COMPREHENSIVE METABOLIC PANEL
ALK PHOS: 71 U/L (ref 39–117)
ALT: 19 U/L (ref 0–35)
AST: 17 U/L (ref 0–37)
Albumin: 4.2 g/dL (ref 3.5–5.2)
BILIRUBIN TOTAL: 0.3 mg/dL (ref 0.2–1.2)
BUN: 22 mg/dL (ref 6–23)
CO2: 25 mEq/L (ref 19–32)
Calcium: 9.3 mg/dL (ref 8.4–10.5)
Chloride: 104 mEq/L (ref 96–112)
Creatinine, Ser: 1 mg/dL (ref 0.40–1.20)
GFR: 61.17 mL/min (ref >60.00)
GLUCOSE: 96 mg/dL (ref 70–99)
Potassium: 4.1 mEq/L (ref 3.5–5.1)
SODIUM: 139 meq/L (ref 135–145)
TOTAL PROTEIN: 7.3 g/dL (ref 6.0–8.3)

## 2017-10-11 LAB — HEMOGLOBIN A1C: Hgb A1c MFr Bld: 5.6 % (ref 4.6–6.5)

## 2017-10-11 NOTE — Assessment & Plan Note (Signed)
Has intermittent lower back pain She states it feels like something does not pop back into place Currently no symptoms Will refer to sports medicine for further evaluation Stressed the importance of regular exercise, especially back exercises

## 2017-10-11 NOTE — Assessment & Plan Note (Signed)
BP well controlled Current regimen effective and well tolerated Continue current medications at current doses CMP 

## 2017-10-11 NOTE — Assessment & Plan Note (Signed)
Check a1c Low sugar / carb diet Stressed regular exercise, weight loss  

## 2017-10-11 NOTE — Assessment & Plan Note (Signed)
Autoimmune blood work done 1 year ago was negative Likely pain is related to osteoarthritis Discussed Tylenol Extra Strength, topical medications, trying to American tart cherry juice and keeping NSAIDs to a minimum Will refer to sports medicine for further advice

## 2017-10-11 NOTE — Assessment & Plan Note (Signed)
GERD controlled Continue daily medication  

## 2017-10-11 NOTE — Assessment & Plan Note (Addendum)
Intermittent, chronic No concerning associated symptoms Lasts < 1 min No further evaluation at this time-we will just monitor

## 2017-10-21 DIAGNOSIS — L03115 Cellulitis of right lower limb: Secondary | ICD-10-CM | POA: Diagnosis not present

## 2017-10-24 ENCOUNTER — Ambulatory Visit: Payer: BLUE CROSS/BLUE SHIELD | Admitting: Family Medicine

## 2017-10-24 DIAGNOSIS — Z2089 Contact with and (suspected) exposure to other communicable diseases: Secondary | ICD-10-CM

## 2017-10-24 NOTE — Progress Notes (Deleted)
[name] [address], [place] [address] Phone: [phone] Subjective:    I'm seeing this patient by the request  of:    CC:   [id number]  Alyssa [name] is a 57 y.o. female coming in with complaint of ***  Onset-  Location Duration-  Character- Aggravating factors- Reliving factors-  Therapies tried-  Severity-   Patient does have a report of a CT abdomen pelvis with contrast.  Report read by me did have a left-sided cystic structure along the left pelvic sidewall but seem to be decreasing in size comparing to a previous CT scan 2 years ago.  No significant osseous findings suggested in report.  Past Medical History:  Diagnosis Date  . Arthritis    hands  . GERD (gastroesophageal reflux disease)   . Hypertension   . 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 SVD (spontaneous vaginal delivery)    x 3   Past Surgical History:  Procedure Laterality Date  . ABDOMINAL HYSTERECTOMY    . APPENDECTOMY     with hysterectomy  . ENDOMETRIAL ABLATION  2002  . LAPAROSCOPIC CHOLECYSTECTOMY  12/09/2016  . TUBAL LIGATION  1991  . WISDOM TOOTH EXTRACTION     Social History   Socioeconomic History  . Marital status: Married    Spouse name: Not on file  . Number of children: 3  . Years of education: Not on file  . Highest education level: Not on file  Occupational History  . Not on file  Social Needs  . Financial resource strain: Not on file  . Food insecurity:    Worry: Not on file    Inability: Not on file  . Transportation needs:    Medical: Not on file    Non-medical: Not on file  Tobacco Use  . Smoking status: Never Smoker  . Smokeless tobacco: Never Used  Substance and Sexual Activity  . Alcohol use: No  . Drug use: No  . Sexual activity: Yes    Birth control/protection: Post-menopausal    Comment: Hysterctomy  Lifestyle  . Physical activity:    Days per week: Not on file    Minutes per session:  Not on file  . Stress: Not on file  Relationships  . Social connections:    Talks on phone: Not on file    Gets together: Not on file    Attends religious service: Not on file    Active member of club or organization: Not on file    Attends meetings of clubs or organizations: Not on file    Relationship status: Not on file  Other Topics Concern  . Not on file  Social History Narrative  . Not on file   Allergies  Allergen Reactions  . Codeine Itching  . Doxycycline     Per pt, she gets chest pain from doxycycline  . Penicillins Nausea And Vomiting   Family History  Problem Relation Age of Onset  . Cancer Mother 71       vulvar cancer  . Lung cancer Mother 72  . Stroke Father   . Prostate cancer Maternal Uncle   . Stroke Maternal Grandmother   . Prostate cancer Maternal Grandfather   . Cervical cancer Paternal Grandmother   . Throat cancer Paternal Grandfather   . Prostate cancer Maternal Uncle   . Colon cancer Neg Hx   . Rectal cancer Neg Hx   . Stomach cancer Neg Hx  Past medical history, social, surgical and family history all reviewed in electronic medical record.  No pertanent information unless stated regarding to the chief complaint.   Review of Systems:Review of systems updated and as accurate as of 10/24/17  No headache, visual changes, nausea, vomiting, diarrhea, constipation, dizziness, abdominal pain, skin rash, fevers, chills, night sweats, weight loss, swollen lymph nodes, body aches, joint swelling, muscle aches, chest pain, shortness of breath, mood changes.   Objective  There were no vitals taken for this visit. Systems examined below as of 10/24/17   General: No apparent distress alert and oriented x3 mood and affect normal, dressed appropriately.  HEENT: Pupils equal, extraocular movements intact  Respiratory: Patient's speak in full sentences and does not appear short of breath  Cardiovascular: No lower extremity edema, non tender, no erythema   Skin: Warm dry intact with no signs of infection or rash on extremities or on axial skeleton.  Abdomen: Soft nontender  Neuro: Cranial nerves II through XII are intact, neurovascularly intact in all extremities with 2+ DTRs and 2+ pulses.  Lymph: No lymphadenopathy of posterior or anterior cervical chain or axillae bilaterally.  Gait normal with good balance and coordination.  MSK:  Non tender with full range of motion and good stability and symmetric strength and tone of shoulders, elbows, wrist, hip, knee and ankles bilaterally.  Back Exam:  Inspection: Unremarkable  Motion: Flexion 45 deg, Extension 45 deg, Side Bending to 45 deg bilaterally,  Rotation to 45 deg bilaterally  SLR laying: Negative  XSLR laying: Negative  Palpable tenderness: None. FABER: negative. Sensory change: Gross sensation intact to all lumbar and sacral dermatomes.  Reflexes: 2+ at both patellar tendons, 2+ at achilles tendons, Babinski's downgoing.  Strength at foot  Plantar-flexion: 5/5 Dorsi-flexion: 5/5 Eversion: 5/5 Inversion: 5/5  Leg strength  Quad: 5/5 Hamstring: 5/5 Hip flexor: 5/5 Hip abductors: 5/5  Gait unremarkable.    Impression and Recommendations:     This case required medical decision making of moderate complexity.      Note: This dictation was prepared with Dragon dictation along with smaller phrase technology. Any transcriptional errors that result from this process are unintentional.

## 2017-11-06 ENCOUNTER — Other Ambulatory Visit: Payer: Self-pay | Admitting: Internal Medicine

## 2017-11-06 DIAGNOSIS — N838 Other noninflammatory disorders of ovary, fallopian tube and broad ligament: Secondary | ICD-10-CM

## 2017-11-11 ENCOUNTER — Encounter: Payer: Self-pay | Admitting: Internal Medicine

## 2017-11-24 ENCOUNTER — Other Ambulatory Visit: Payer: Self-pay | Admitting: Internal Medicine

## 2018-01-28 ENCOUNTER — Telehealth: Payer: Self-pay | Admitting: *Deleted

## 2018-01-28 NOTE — Telephone Encounter (Signed)
Patient called back and moved her appt ffrom 10/10 to 10/11

## 2018-01-28 NOTE — Telephone Encounter (Signed)
Patient called complaining of upper right sided pain.  Patient states that the pain comes and goes.  Patient also states that she has had this pain going on two weeks.Patient also states that patient does heavy lifting at work. Patient is a patient of Dr. Serita Grit and has a follow-up in October. Recommended that patient see PCP to be evaluated. Patient verbalized understanding.

## 2018-01-28 NOTE — Telephone Encounter (Signed)
Called and left the patient a message to call the office back. Need to move the patient's appt from 10/10 to 10/11. Dr. Denman George will be in the OR

## 2018-02-04 ENCOUNTER — Ambulatory Visit: Payer: BLUE CROSS/BLUE SHIELD | Admitting: Internal Medicine

## 2018-03-26 ENCOUNTER — Encounter: Payer: Self-pay | Admitting: Internal Medicine

## 2018-03-26 ENCOUNTER — Encounter: Payer: Self-pay | Admitting: Gynecologic Oncology

## 2018-03-29 ENCOUNTER — Other Ambulatory Visit: Payer: Self-pay | Admitting: Internal Medicine

## 2018-04-07 ENCOUNTER — Inpatient Hospital Stay: Payer: BLUE CROSS/BLUE SHIELD | Attending: Gynecologic Oncology

## 2018-04-07 DIAGNOSIS — C569 Malignant neoplasm of unspecified ovary: Secondary | ICD-10-CM | POA: Diagnosis not present

## 2018-04-07 LAB — CEA (IN HOUSE-CHCC): CEA (CHCC-IN HOUSE): 1 ng/mL (ref 0.00–5.00)

## 2018-04-08 LAB — CA 125: CANCER ANTIGEN (CA) 125: 8.7 U/mL (ref 0.0–38.1)

## 2018-04-10 ENCOUNTER — Encounter: Payer: Self-pay | Admitting: Internal Medicine

## 2018-04-10 ENCOUNTER — Ambulatory Visit: Payer: BLUE CROSS/BLUE SHIELD | Admitting: Gynecologic Oncology

## 2018-04-10 ENCOUNTER — Ambulatory Visit (INDEPENDENT_AMBULATORY_CARE_PROVIDER_SITE_OTHER): Payer: BLUE CROSS/BLUE SHIELD | Admitting: Internal Medicine

## 2018-04-10 VITALS — BP 130/76 | HR 66 | Temp 98.3°F | Resp 16 | Ht 62.0 in | Wt 200.0 lb

## 2018-04-10 DIAGNOSIS — M545 Low back pain, unspecified: Secondary | ICD-10-CM

## 2018-04-10 DIAGNOSIS — K219 Gastro-esophageal reflux disease without esophagitis: Secondary | ICD-10-CM

## 2018-04-10 DIAGNOSIS — I10 Essential (primary) hypertension: Secondary | ICD-10-CM

## 2018-04-10 DIAGNOSIS — Z23 Encounter for immunization: Secondary | ICD-10-CM

## 2018-04-10 DIAGNOSIS — E669 Obesity, unspecified: Secondary | ICD-10-CM

## 2018-04-10 DIAGNOSIS — R7303 Prediabetes: Secondary | ICD-10-CM | POA: Diagnosis not present

## 2018-04-10 DIAGNOSIS — M25512 Pain in left shoulder: Secondary | ICD-10-CM | POA: Insufficient documentation

## 2018-04-10 DIAGNOSIS — M25559 Pain in unspecified hip: Secondary | ICD-10-CM

## 2018-04-10 DIAGNOSIS — G8929 Other chronic pain: Secondary | ICD-10-CM

## 2018-04-10 NOTE — Assessment & Plan Note (Signed)
Stressed weight loss and discussed all benefits of her losing weight Stressed the importance of regular exercise Discussed healthier diet and small portions Follow-up in 6 months

## 2018-04-10 NOTE — Patient Instructions (Addendum)
  Flu immunization administered today.     Medications reviewed and updated.  Changes include :   none     Please followup in 6 months   

## 2018-04-10 NOTE — Assessment & Plan Note (Signed)
GERD controlled Continue daily medication  

## 2018-04-10 NOTE — Assessment & Plan Note (Signed)
Last A1c was in normal range She has not been compliant with a diabetic diet and has not been exercising Stressed the importance of a low sugar/carbohydrate diet, regular exercise and weight loss We will recheck A1c in 6 months

## 2018-04-10 NOTE — Assessment & Plan Note (Signed)
BP well controlled Current regimen effective and well tolerated Continue current medications at current doses We will get blood work done at next visit

## 2018-04-10 NOTE — Assessment & Plan Note (Signed)
Having lower back and hip pain Refer to sports medicine for further evaluation

## 2018-04-10 NOTE — Progress Notes (Signed)
Subjective:    Patient ID: Alyssa [name], female    DOB: [birth year], 57 y.o.   MRN: [id number]  HPI The patient is here for follow up.  Hypertension: She is taking her medication daily. She is somewhat compliant with a low sodium diet.  She denies chest pain, palpitations, edema, shortness of breath and regular headaches. She is not exercising regularly.  She does not monitor her blood pressure at home.    Prediabetes:  She is not compliant with a low sugar/carbohydrate diet.  She is not exercising regularly.  GERD:  She is taking her medication daily as prescribed.  She denies any GERD symptoms and feels her GERD is well controlled.   Obesity: She knows she needs to lose weight and knows what she needs to do to lose weight, but admits that she is not motivated to make the changes.  She is not eating as healthy as she should be eating.  She is not exercising regularly.  Hip and back pain, left shoulder pain: She is having lower back pain and hip pain.  She is also having left shoulder pain.  The shoulder pain is intermittent.  She thinks she may have injured at work lifting something, but she is not sure.  She has not seen anyone for these ailments.   Medications and allergies reviewed with patient and updated if appropriate.  Patient Active Problem List   Diagnosis Date Noted  . Palpitations 10/11/2017  . 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 Midline low back pain without sciatica 10/11/2017  . Genetic testing 12/27/2016  . Arthralgia 10/05/2016  . Prediabetes 10/04/2016  . Right hip pain 06/19/2016  . 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 Family history of prostate cancer 12/01/2015  . Esophageal reflux 09/15/2015  . Obesity (BMI 30-39.9) 06/03/2015  . Essential hypertension 11/11/2014  . Chemotherapy-induced peripheral neuropathy ([place]) 10/18/2014  . Vasomotor instability 10/05/2014  . Chemotherapy induced neutropenia ([place]) 10/05/2014  .  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 Surgical menopause 08/24/2014  . Ovarian cancer ([place]) 08/02/2014    Current Outpatient Medications on File Prior to Visit  Medication Sig Dispense Refill  . amLODipine (NORVASC) 5 MG tablet TAKE 1 TABLET BY MOUTH  DAILY 90 tablet 1  . diphenhydrAMINE (BENADRYL) 25 MG tablet Take 50 mg at bedtime by mouth.    . estradiol (VIVELLE-DOT) 0.025 MG/24HR APPLY 1 PATCH TWICE WEEKLY 24 patch 11  . losartan (COZAAR) 50 MG tablet TAKE 1 TABLET BY MOUTH  DAILY 90 tablet 1  . omeprazole (PRILOSEC) 20 MG capsule TAKE 1 CAPSULE BY MOUTH  DAILY 90 capsule 3   No current facility-administered medications on file prior to visit.     Past Medical History:  Diagnosis Date  . Arthritis    hands  . GERD (gastroesophageal reflux disease)   . Hypertension   . 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 SVD (spontaneous vaginal delivery)    x 3    Past Surgical History:  Procedure Laterality Date  . ABDOMINAL HYSTERECTOMY    . APPENDECTOMY     with hysterectomy  . ENDOMETRIAL ABLATION  2002  . LAPAROSCOPIC CHOLECYSTECTOMY  12/09/2016  . TUBAL LIGATION  1991  . WISDOM TOOTH EXTRACTION      Social History   Socioeconomic History  . Marital status: Married    Spouse name: Not on file  . Number of children: 3  . Years of  education: Not on file  . Highest education level: Not on file  Occupational History  . Not on file  Social Needs  . Financial resource strain: Not on file  . Food insecurity:    Worry: Not on file    Inability: Not on file  . Transportation needs:    Medical: Not on file    Non-medical: Not on file  Tobacco Use  . Smoking status: Never Smoker  . Smokeless tobacco: Never Used  Substance and Sexual Activity  . Alcohol use: No  . Drug use: No  . Sexual activity: Yes    Birth control/protection: Post-menopausal    Comment: Hysterctomy  Lifestyle  . Physical activity:    Days per week: Not on file    Minutes  per session: Not on file  . Stress: Not on file  Relationships  . Social connections:    Talks on phone: Not on file    Gets together: Not on file    Attends religious service: Not on file    Active member of club or organization: Not on file    Attends meetings of clubs or organizations: Not on file    Relationship status: Not on file  Other Topics Concern  . Not on file  Social History Narrative  . Not on file    Family History  Problem Relation Age of Onset  . Cancer Mother 47       vulvar cancer  . Lung cancer Mother 59  . Stroke Father   . Prostate cancer Maternal Uncle   . Stroke Maternal Grandmother   . Prostate cancer Maternal Grandfather   . Cervical cancer Paternal Grandmother   . Throat cancer Paternal Grandfather   . Prostate cancer Maternal Uncle   . Colon cancer Neg Hx   . Rectal cancer Neg Hx   . Stomach cancer Neg Hx     Review of Systems  Constitutional: Positive for diaphoresis. Negative for chills and fever.  Respiratory: Negative for cough, shortness of breath and wheezing.   Cardiovascular: Negative for chest pain, palpitations and leg swelling.  Gastrointestinal: Negative for abdominal pain and nausea.  Musculoskeletal: Positive for arthralgias and back pain.  Neurological: Negative for light-headedness and headaches.       Objective:   Vitals:   04/10/18 1007  BP: 130/76  Pulse: 66  Resp: 16  Temp: 98.3 F (36.8 C)  SpO2: 96%   BP Readings from Last 3 Encounters:  04/10/18 130/76  10/11/17 120/82  10/09/17 119/82   Wt Readings from Last 3 Encounters:  04/10/18 200 lb (90.7 kg)  10/11/17 197 lb (89.4 kg)  10/09/17 194 lb 11.2 oz (88.3 kg)   Body mass index is 36.58 kg/m.   Physical Exam    Constitutional: Appears well-developed and well-nourished. No distress.  HENT:  Head: Normocephalic and atraumatic.  Neck: Neck supple. No tracheal deviation present. No thyromegaly present.  No cervical lymphadenopathy Cardiovascular:  Normal rate, regular rhythm and normal heart sounds.   No murmur heard. No carotid bruit .  No edema Pulmonary/Chest: Effort normal and breath sounds normal. No respiratory distress. No has no wheezes. No rales.  Skin: Skin is warm and dry. Not diaphoretic.  Psychiatric: Normal mood and affect. Behavior is normal.      Assessment & Plan:    See Problem List for Assessment and Plan of chronic medical problems.

## 2018-04-10 NOTE — Assessment & Plan Note (Signed)
Left shoulder pain Referred to sports medicine for further evaluation

## 2018-04-11 ENCOUNTER — Inpatient Hospital Stay: Payer: BLUE CROSS/BLUE SHIELD | Admitting: Gynecologic Oncology

## 2018-04-21 DIAGNOSIS — L820 Inflamed seborrheic keratosis: Secondary | ICD-10-CM | POA: Diagnosis not present

## 2018-04-27 NOTE — Progress Notes (Signed)
[name] [address] [place], [place] [address] Phone: [phone] Subjective:   Alyssa [name], am serving as a scribe for Dr. Hulan Saas.  Patient was referred by Dr. Quay Burow  CC: left shoulder back and hip pain   [id number]  Alyssa [name] is a 57 y.o. female coming in with complaint of right hip and back pain. Does have radiating pain down the posterior aspect of right leg. Intermittent pain. Pain increases at night in her hip. Back pain occurs later in the day. 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  Patient's insulin is sometimes severe.  Can wake her up at night.  Especially when she rolls onto her right side.  Does not remember any true injury.  Left shoulder pain for 2 months. Does have radiating symptoms down into her hand. Constant pain. Pain with external rotation and flexion.  Patient has noticed some loss of range of motion.  Rates the severity pain is 8 out of 10.     Past Medical History:  Diagnosis Date  . Arthritis    hands  . GERD (gastroesophageal reflux disease)   . Hypertension   . 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 SVD (spontaneous vaginal delivery)    x 3   Past Surgical History:  Procedure Laterality Date  . ABDOMINAL HYSTERECTOMY    . APPENDECTOMY     with hysterectomy  . ENDOMETRIAL ABLATION  2002  . LAPAROSCOPIC CHOLECYSTECTOMY  12/09/2016  . TUBAL LIGATION  1991  . WISDOM TOOTH EXTRACTION     Social History   Socioeconomic History  . Marital status: Married    Spouse name: Not on file  . Number of children: 3  . Years of education: Not on file  . Highest education level: Not on file  Occupational History  . Not on file  Social Needs  . Financial resource strain: Not on file  . Food insecurity:    Worry: Not on file    Inability: Not on file  . Transportation needs:    Medical: Not on file    Non-medical: Not on file  Tobacco Use  . Smoking status: Never  Smoker  . Smokeless tobacco: Never Used  Substance and Sexual Activity  . Alcohol use: [name]  . Drug use: [name]  . Sexual activity: Yes    Birth control/protection: Post-menopausal    Comment: Hysterctomy  Lifestyle  . Physical activity:    Days per week: Not on file    Minutes per session: Not on file  . Stress: Not on file  Relationships  . Social connections:    Talks on phone: Not on file    Gets together: Not on file    Attends religious service: Not on file    Active member of club or organization: Not on file    Attends meetings of clubs or organizations: Not on file    Relationship status: Not on file  Other Topics Concern  . Not on file  Social History Narrative  . Not on file   Allergies  Allergen Reactions  . Codeine Itching  . Doxycycline     Per pt, she gets chest pain from doxycycline  . Penicillins Nausea And Vomiting   Family History  Problem Relation Age of Onset  . Cancer Mother 68       vulvar cancer  . Lung cancer Mother 25  . Stroke Father   .  Prostate cancer Maternal Uncle   . Stroke Maternal Grandmother   . Prostate cancer Maternal Grandfather   . Cervical cancer Paternal Grandmother   . Throat cancer Paternal Grandfather   . Prostate cancer Maternal Uncle   . Colon cancer Neg Hx   . Rectal cancer Neg Hx   . Stomach cancer Neg Hx     Current Outpatient Medications (Endocrine & Metabolic):  .  estradiol (VIVELLE-DOT) 0.025 MG/24HR, APPLY 1 PATCH TWICE WEEKLY  Current Outpatient Medications (Cardiovascular):  .  amLODipine (NORVASC) 5 MG tablet, TAKE 1 TABLET BY MOUTH  DAILY .  losartan (COZAAR) 50 MG tablet, TAKE 1 TABLET BY MOUTH  DAILY  Current Outpatient Medications (Respiratory):  .  diphenhydrAMINE (BENADRYL) 25 MG tablet, Take 50 mg at bedtime by mouth.    Current Outpatient Medications (Other):  .  omeprazole (PRILOSEC) 20 MG capsule, TAKE 1 CAPSULE BY MOUTH  DAILY    Past medical history, social, surgical and family history  all reviewed in electronic medical record.  [name] pertanent information unless stated regarding to the chief complaint.   Review of Systems:  [name] headache, visual changes, nausea, vomiting, diarrhea, constipation, dizziness, abdominal pain, skin rash, fevers, chills, night sweats, weight loss, swollen lymph nodes, body aches, joint swelling, muscle aches, chest pain, shortness of breath, mood changes.   Objective  Blood pressure 132/88, pulse 94, height 5\' 2"  (1.575 m), weight 201 lb (91.2 kg), SpO2 97 %.   General: [name] apparent distress alert and oriented x3 mood and affect normal, dressed appropriately.  HEENT: Pupils equal, extraocular movements intact  Respiratory: Patient's speak in full sentences and does not appear short of breath  Cardiovascular: [name] lower extremity edema, non tender, [name] erythema  Skin: Warm dry intact with [name] signs of infection or rash on extremities or on axial skeleton.  Abdomen: Soft nontender  Neuro: Cranial nerves II through XII are intact, neurovascularly intact in all extremities with 2+ DTRs and 2+ pulses.  Lymph: [name] lymphadenopathy of posterior or anterior cervical chain or axillae bilaterally.  Gait normal with good balance and coordination.  MSK:  Non tender with full range of motion and good stability and symmetric strength and tone of  elbows, wrist, hip, knee and ankles bilaterally.  Shoulder: left Inspection reveals [name] abnormalities, atrophy or asymmetry. Palpation is normal with [name] tenderness over AC joint or bicipital groove. Loss of range of motion especially in external range of motion, internal range of motion to sacrum Rotator cuff strength normal throughout. signs of impingement with positive Neer and Hawkin's tests, but negative empty can sign. Speeds and Yergason's tests normal. [name] labral pathology noted with negative Obrien's, negative clunk and good stability. Normal scapular function observed. [name] painful arc and [name] drop arm sign. [name]  apprehension sign Contralateral shoulder fairly unremarkable  Back exam shows loss of lordosis.  Mild tightness with straight leg test but otherwise full range of motion, tightness with Faber bilaterally severe tenderness to palpation over the right greater trochanteric area.  Negative straight leg test.  Neurovascular intact distally with 5 out of 5 strength that is symmetric   Procedure: Real-time Ultrasound Guided Injection of left glenohumeral joint Device: GE Logiq E  Ultrasound guided injection is preferred based studies that show increased duration, increased effect, greater accuracy, decreased procedural pain, increased response rate with ultrasound guided versus blind injection.  Verbal informed consent obtained.  Time-out conducted.  Noted [name] overlying erythema, induration, or other signs of local infection.  Skin prepped in  a sterile fashion.  Local anesthesia: Topical Ethyl chloride.  With sterile technique and under real time ultrasound guidance:  Joint visualized.  23g 1  inch needle inserted posterior approach. Pictures taken for needle placement. Patient did have injection of 2 cc of 1% lidocaine, 2 cc of 0.5% Marcaine, and 1.0 cc of Kenalog 40 mg/dL. Completed without difficulty  Pain immediately resolved suggesting accurate placement of the medication.  Advised to call if fevers/chills, erythema, induration, drainage, or persistent bleeding.  Images permanently stored and available for review in the ultrasound unit.  Impression: Technically successful ultrasound guided injection.    Procedure: Real-time Ultrasound Guided Injection of right greater trochanteric bursitis secondary to patient's body habitus Device: GE Logiq Q7 Ultrasound guided injection is preferred based studies that show increased duration, increased effect, greater accuracy, decreased procedural pain, increased response rate, and decreased cost with ultrasound guided versus blind injection.  Verbal  informed consent obtained.  Time-out conducted.  Noted [name] overlying erythema, induration, or other signs of local infection.  Skin prepped in a sterile fashion.  Local anesthesia: Topical Ethyl chloride.  With sterile technique and under real time ultrasound guidance:  Greater trochanteric area was visualized and patient's bursa was noted. A 22-gauge 3 inch needle was inserted and 4 cc of 0.5% Marcaine and 1 cc of Kenalog 40 mg/dL was injected. Pictures taken Completed without difficulty  Pain immediately resolved suggesting accurate placement of the medication.  Advised to call if fevers/chills, erythema, induration, drainage, or persistent bleeding.  Images permanently stored and available for review in the ultrasound unit.  Impression: Technically successful ultrasound guided injection.  [id number]; 15 additional minutes spent for Therapeutic exercises as stated in above notes.  This included exercises focusing on stretching, strengthening, with significant focus on eccentric aspects.   Long term goals include an improvement in range of motion, strength, endurance as well as avoiding reinjury. Patient's frequency would include in 1-2 times a day, 3-5 times a week for a duration of 6-12 weeks. Shoulder Exercises that included:  Basic scapular stabilization to include adduction and depression of scapula Scaption, focusing on proper movement and good control Internal and External rotation utilizing a theraband, with elbow tucked at side entire time Rows with theraband   Proper technique shown and discussed handout in great detail with ATC.  All questions were discussed and answered.     Impression and Recommendations:     This case required medical decision making of moderate complexity. The above documentation has been reviewed and is accurate and complete Alyssa Pulley, DO       Note: This dictation was prepared with Dragon dictation along with smaller phrase technology. Any  transcriptional errors that result from this process are unintentional.

## 2018-04-28 ENCOUNTER — Ambulatory Visit (INDEPENDENT_AMBULATORY_CARE_PROVIDER_SITE_OTHER): Payer: BLUE CROSS/BLUE SHIELD | Admitting: Family Medicine

## 2018-04-28 ENCOUNTER — Encounter: Payer: Self-pay | Admitting: Family Medicine

## 2018-04-28 ENCOUNTER — Ambulatory Visit: Payer: Self-pay

## 2018-04-28 ENCOUNTER — Ambulatory Visit (INDEPENDENT_AMBULATORY_CARE_PROVIDER_SITE_OTHER)
Admission: RE | Admit: 2018-04-28 | Discharge: 2018-04-28 | Disposition: A | Discharge status: Home / Self Care | Payer: BLUE CROSS/BLUE SHIELD | Source: Ambulatory Visit | Attending: Family Medicine | Admitting: Family Medicine

## 2018-04-28 VITALS — BP 132/88 | HR 94 | Ht 62.0 in | Wt 201.0 lb

## 2018-04-28 DIAGNOSIS — M7502 Adhesive capsulitis of left shoulder: Secondary | ICD-10-CM | POA: Diagnosis not present

## 2018-04-28 DIAGNOSIS — M545 Low back pain, unspecified: Secondary | ICD-10-CM

## 2018-04-28 DIAGNOSIS — M7500 Adhesive capsulitis of unspecified shoulder: Secondary | ICD-10-CM | POA: Insufficient documentation

## 2018-04-28 DIAGNOSIS — G8929 Other chronic pain: Secondary | ICD-10-CM

## 2018-04-28 DIAGNOSIS — M25512 Pain in left shoulder: Secondary | ICD-10-CM | POA: Diagnosis not present

## 2018-04-28 DIAGNOSIS — M7061 Trochanteric bursitis, right hip: Secondary | ICD-10-CM | POA: Diagnosis not present

## 2018-04-28 DIAGNOSIS — M5126 Other intervertebral disc displacement, lumbar region: Secondary | ICD-10-CM | POA: Diagnosis not present

## 2018-04-28 DIAGNOSIS — M48061 Spinal stenosis, lumbar region without neurogenic claudication: Secondary | ICD-10-CM | POA: Diagnosis not present

## 2018-04-28 DIAGNOSIS — S4992XA Unspecified injury of left shoulder and upper arm, initial encounter: Secondary | ICD-10-CM | POA: Diagnosis not present

## 2018-04-28 DIAGNOSIS — C569 Malignant neoplasm of unspecified ovary: Secondary | ICD-10-CM

## 2018-04-28 NOTE — Assessment & Plan Note (Signed)
Patient given injection today.  Tolerated the procedure well.  Discussed icing regimen and home exercise.  Discussed which activities doing which wants to avoid.  Patient will have x-rays done well secondary to patient's comorbidities.  Work with Product/process development scientist to learn home exercises in greater detail.  Follow-up again in 4 to 8 weeks

## 2018-04-28 NOTE — Patient Instructions (Signed)
Good to see yo u Alyssa [name] is your friend Ice 20 minutes 2 times daily. Usually after activity and before bed. pennsaid pinkie amount topically 2 times daily as needed.  Xrays downstairs today  Injected the shoulder and the back  Exercises 3 times a week.   Over the counter need B12 1079mcg daily  B6 100mg  daily  Vitamin D 2000 IU daily  Turmeric 500mg  daily  Tart cherry extract any dose at night See me again in 6 weeks\

## 2018-04-28 NOTE — Assessment & Plan Note (Signed)
Injection given today.  Tolerated the procedure well.  Discussed icing regimen and home exercises.  Discussed posture and ergonomics, discussed weight loss.  Discussed importance of hip abductor strengthening.  Follow-up again in 4 to 8 weeks x-rays ordered secondary to history of cancer

## 2018-04-29 ENCOUNTER — Encounter: Payer: Self-pay | Admitting: Family Medicine

## 2018-04-30 NOTE — Telephone Encounter (Signed)
Copied from [place] [phone] [phone]. Topic: General - Other >> Apr 29, 2018  4:54 PM Keene Breath wrote: Reason for CRM: Patient called to ask the doctor about the xrays of her back.  Patient would like to know the results of those xrays.  Please advise and call patient back as soon as possible.  CB# [phone]

## 2018-05-05 ENCOUNTER — Encounter: Payer: Self-pay | Admitting: Family Medicine

## 2018-06-01 ENCOUNTER — Other Ambulatory Visit: Payer: Self-pay | Admitting: Gynecologic Oncology

## 2018-06-01 DIAGNOSIS — R55 Syncope and collapse: Secondary | ICD-10-CM

## 2018-06-01 DIAGNOSIS — N9489 Other specified conditions associated with female genital organs and menstrual cycle: Secondary | ICD-10-CM

## 2018-06-01 DIAGNOSIS — E8940 Asymptomatic postprocedural ovarian failure: Secondary | ICD-10-CM

## 2018-06-09 ENCOUNTER — Ambulatory Visit (INDEPENDENT_AMBULATORY_CARE_PROVIDER_SITE_OTHER): Payer: BLUE CROSS/BLUE SHIELD | Admitting: Family Medicine

## 2018-06-09 DIAGNOSIS — M7061 Trochanteric bursitis, right hip: Secondary | ICD-10-CM

## 2018-06-09 DIAGNOSIS — M7502 Adhesive capsulitis of left shoulder: Secondary | ICD-10-CM | POA: Diagnosis not present

## 2018-06-09 NOTE — Assessment & Plan Note (Signed)
Significant improvement.  Neurology significant changes management.  Have patient increase activity as tolerated.  Follow-up with me again in 6 to 8 weeks

## 2018-06-09 NOTE — Progress Notes (Signed)
[name] [address] [place], [place] [address] Phone: [phone] Subjective:    I Alyssa [name] am serving as a Education administrator for Dr. Hulan Saas.   CC: Left shoulder pain follow-up, pain follow-up  ACZ:[id number]  Alyssa [name] is a 57 y.o. female coming in with complaint of left shoulder pain. Pain started again about 1.5 weeks ago. Injection last visit. Hip is doing well. Would like a prescription of pennsaid to [facility].  Patient does have about 80 to 90% improvement she states.  Both of them seems to be better.  Still noticing some loss of range of motion of the shoulder.  Patient states though that the hip is significantly better.     Past Medical History:  Diagnosis Date  . Arthritis    hands  . GERD (gastroesophageal reflux disease)   . Hypertension   . 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 SVD (spontaneous vaginal delivery)    x 3   Past Surgical History:  Procedure Laterality Date  . ABDOMINAL HYSTERECTOMY    . APPENDECTOMY     with hysterectomy  . ENDOMETRIAL ABLATION  2002  . LAPAROSCOPIC CHOLECYSTECTOMY  12/09/2016  . TUBAL LIGATION  1991  . WISDOM TOOTH EXTRACTION     Social History   Socioeconomic History  . Marital status: Married    Spouse name: Not on file  . Number of children: 3  . Years of education: Not on file  . Highest education level: Not on file  Occupational History  . Not on file  Social Needs  . Financial resource strain: Not on file  . Food insecurity:    Worry: Not on file    Inability: Not on file  . Transportation needs:    Medical: Not on file    Non-medical: Not on file  Tobacco Use  . Smoking status: Never Smoker  . Smokeless tobacco: Never Used  Substance and Sexual Activity  . Alcohol use: No  . Drug use: No  . Sexual activity: Yes    Birth control/protection: Post-menopausal    Comment: Hysterctomy  Lifestyle  . Physical activity:    Days per week: Not on file     Minutes per session: Not on file  . Stress: Not on file  Relationships  . Social connections:    Talks on phone: Not on file    Gets together: Not on file    Attends religious service: Not on file    Active member of club or organization: Not on file    Attends meetings of clubs or organizations: Not on file    Relationship status: Not on file  Other Topics Concern  . Not on file  Social History Narrative  . Not on file   Allergies  Allergen Reactions  . Codeine Itching  . Doxycycline     Per pt, she gets chest pain from doxycycline  . Penicillins Nausea And Vomiting   Family History  Problem Relation Age of Onset  . Cancer Mother 62       vulvar cancer  . Lung cancer Mother 20  . Stroke Father   . Prostate cancer Maternal Uncle   . Stroke Maternal Grandmother   . Prostate cancer Maternal Grandfather   . Cervical cancer Paternal Grandmother   . Throat cancer Paternal Grandfather   . Prostate cancer Maternal Uncle   . Colon cancer Neg Hx   . Rectal cancer Neg Hx   .  Stomach cancer Neg Hx     Current Outpatient Medications (Endocrine & Metabolic):  .  estradiol (VIVELLE-DOT) 0.025 MG/24HR, APPLY 1 PATCH TWICE WEEKLY  Current Outpatient Medications (Cardiovascular):  .  amLODipine (NORVASC) 5 MG tablet, TAKE 1 TABLET BY MOUTH  DAILY .  losartan (COZAAR) 50 MG tablet, TAKE 1 TABLET BY MOUTH  DAILY  Current Outpatient Medications (Respiratory):  .  diphenhydrAMINE (BENADRYL) 25 MG tablet, Take 50 mg at bedtime by mouth.    Current Outpatient Medications (Other):  .  omeprazole (PRILOSEC) 20 MG capsule, TAKE 1 CAPSULE BY MOUTH  DAILY    Past medical history, social, surgical and family history all reviewed in electronic medical record.  No pertanent information unless stated regarding to the chief complaint.   Review of Systems:  No headache, visual changes, nausea, vomiting, diarrhea, constipation, dizziness, abdominal pain, skin rash, fevers, chills, night  sweats, weight loss, swollen lymph nodes, body aches, joint swelling, muscle aches, chest pain, shortness of breath, mood changes.  Positive muscle aches  Objective  Blood pressure 128/80, pulse 84, height 5\' 2"  (1.575 m), weight 203 lb (92.1 kg), SpO2 97 %.   General: No apparent distress alert and oriented x3 mood and affect normal, dressed appropriately.  HEENT: Pupils equal, extraocular movements intact  Respiratory: Patient's speak in full sentences and does not appear short of breath  Cardiovascular: No lower extremity edema, non tender, no erythema  Skin: Warm dry intact with no signs of infection or rash on extremities or on axial skeleton.  Abdomen: Soft nontender  Neuro: Cranial nerves II through XII are intact, neurovascularly intact in all extremities with 2+ DTRs and 2+ pulses.  Lymph: No lymphadenopathy of posterior or anterior cervical chain or axillae bilaterally.  Gait normal with good balance and coordination.  MSK:  Non tender with full range of motion and good stability and symmetric strength and tone of  elbows, wrist, hip, knee and ankles bilaterally.  Shoulder: left  Inspection reveals no abnormalities, atrophy or asymmetry. Palpation is normal with no tenderness over AC joint or bicipital groove. Patient does have some decreasing range of motion.  Still mostly with external rotation Normal scapular function observed. No painful arc and no drop arm sign. No apprehension sign      Impression and Recommendations:      The above documentation has been reviewed and is accurate and complete Lyndal Pulley, DO       Note: This dictation was prepared with Dragon dictation along with smaller phrase technology. Any transcriptional errors that result from this process are unintentional.

## 2018-06-09 NOTE — Patient Instructions (Signed)
Happy holidays!  6-8 weeks

## 2018-06-09 NOTE — Assessment & Plan Note (Signed)
Near complete resolution of pain.  Continue hip abductor and core strengthening.  Follow-up again in 6 to 8 weeks

## 2018-06-10 ENCOUNTER — Encounter: Payer: Self-pay | Admitting: Family Medicine

## 2018-06-11 ENCOUNTER — Other Ambulatory Visit: Payer: Self-pay | Admitting: *Deleted

## 2018-06-11 MED ORDER — DICLOFENAC SODIUM 2 % TD SOLN: TRANSDERMAL | 3 refills | Status: DC

## 2018-07-03 ENCOUNTER — Telehealth: Payer: Self-pay

## 2018-07-03 NOTE — Telephone Encounter (Signed)
Incoming call from pt- making appt for follow up - missed her appt in October and needs to reschedule.  Appt made for Jan 24th at 11:30 am.  No other needs per pt at this time.

## 2018-07-25 ENCOUNTER — Inpatient Hospital Stay: Payer: BLUE CROSS/BLUE SHIELD | Attending: Gynecologic Oncology

## 2018-07-25 ENCOUNTER — Inpatient Hospital Stay (HOSPITAL_BASED_OUTPATIENT_CLINIC_OR_DEPARTMENT_OTHER): Payer: BLUE CROSS/BLUE SHIELD | Admitting: Gynecologic Oncology

## 2018-07-25 ENCOUNTER — Encounter: Payer: Self-pay | Admitting: Gynecologic Oncology

## 2018-07-25 VITALS — BP 131/89 | HR 72 | Temp 98.4°F | Resp 20 | Ht 62.0 in | Wt 202.7 lb

## 2018-07-25 DIAGNOSIS — Z90722 Acquired absence of ovaries, bilateral: Secondary | ICD-10-CM | POA: Diagnosis not present

## 2018-07-25 DIAGNOSIS — Z9221 Personal history of antineoplastic chemotherapy: Secondary | ICD-10-CM

## 2018-07-25 DIAGNOSIS — Z08 Encounter for follow-up examination after completed treatment for malignant neoplasm: Secondary | ICD-10-CM

## 2018-07-25 DIAGNOSIS — Z8543 Personal history of malignant neoplasm of ovary: Secondary | ICD-10-CM | POA: Diagnosis not present

## 2018-07-25 DIAGNOSIS — Z90710 Acquired absence of both cervix and uterus: Secondary | ICD-10-CM | POA: Insufficient documentation

## 2018-07-25 DIAGNOSIS — C569 Malignant neoplasm of unspecified ovary: Secondary | ICD-10-CM

## 2018-07-25 LAB — CEA (IN HOUSE-CHCC): CEA (CHCC-In House): 1.63 ng/mL (ref 0.00–5.00)

## 2018-07-25 NOTE — Progress Notes (Signed)
Followup Note: Gyn-Onc   CC:  Chief Complaint  Patient presents with  . Malignant neoplasm of ovary, unspecified laterality [facility])    Assessment/Plan:  Ms. Mehreen [name]  is a 58 y.o.  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 No evidence of recurrence on exam today.   1/ left pelvic mass on CT imaging:This appears most consistent with a lymphocyst. It is smooth and regular in appearnce and cystic in appearance. It is stable/decreasing on followup imaging. No additional surveillance is necessary.  2/ Followup: with me in 6 months.  3/ She was counseled regarding symptoms of recurrence and to notify us sooner if these develop.  4/ CA 125 and CEA at next visit in 6 months.   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 Discussed increased risk of breast cancer with prolonged use >5years.  HPI: Alyssa [name] is a 58 year old G3 P3 who is seen in consultation at the request of Dr. Marin Roberts for a 26 cm pelvic mass and elevated CA-125. The patient began noting vague abdominal fullness and distention since December 2015. When she was evaluated by her local ER at [facility] a CT of the abdomen and pelvis was obtained on 07/28/2014. This revealed a large (26 a 12 cm) cystic mass with enhancing septations and heterogeneous cystic spaces and peripheral nodular soft tissue component. It appeared to be arising from the left ovary. It occupied the entire mid pelvis. There was deviation of the otherwise normal appearing uterus. There was no ascites lymphadenopathy perineal carcinomatosis or omental cake identified. She was seen by Dr. Armandina Stammer this further evaluated her with a CA-125 which was mildly elevated at 68. CEA was normal. Given the potential for underlying malignancy she was sent  for my evaluation and consultation.  The patient is otherwise healthy woman. She denies abdominal pain, early satiety, or weight loss. Her only prior abdominal surgery is a tubal ligation. She has also undergone endometrial ablation. The family history is not concerning for increased risk for malignancy (her mother had dual cancers of vulva and lung but was a heavy smoker). She is neck for performance status. She is premenopausal and continues to have regular menses with no intermenstrual bleeding.  She has a history of an ASCUS pap 3 years ago with negative hr HPV testing at that time.  She underwent TAH, BSO lymphadenectomy, omentectomy with Dr Kaleen Mask at [facility] on 08/11/14. 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 The primary tumor was 25 cm, no LDS I, 0 of 16 nodes involved , the tumor was intestinal type with expansile patent and associated with a mucinous borderline tumor of the ovary.  She went on to receive 6 cycles of adjuvant carboplatin and paclitaxel chemotherapy (from 08/31/14 to 12/14/14).  CA 125 at completion of therapy in June, 2016 was 13.   CT chest/abdo/pelvis on 01/17/15 : Status post total abdominal hysterectomy and bilateral salpingo-oophorectomy. Previously noted large pelvis mass has been resected. However, today's study does demonstrate a 6.0 x 4.7 x 7.1 cm well-defined low-attenuation lesion along the left pelvic sidewall (image 71 of series 2), suspicious for local recurrence of disease. No other definite cystic or solid pelvic mass is identified. No definite peritoneal nodularity.   03/03/15 Followup CT: showed reduction in size  of size of left pelvic lymphocyst.  CA 125 on 06/18/16 was stable at 10.  Interval History: she feels well with no complaints and no ongoing symptoms of chemotherapy. No symptoms from lymphocyst compression. S/p cholecystectomy in 2018.  Her CA 125 had increased to 12 in  November, 2018. It then decreased to 11 in December, 2018 and down to 10 in March, 2019.   Current Meds:  Outpatient Encounter Medications as of 07/25/2018  Medication Sig  . amLODipine (NORVASC) 5 MG tablet TAKE 1 TABLET BY MOUTH  DAILY  . Diclofenac Sodium 2 % SOLN Apply 1 pump twice daily as needed.  . diphenhydrAMINE (BENADRYL) 25 MG tablet Take 50 mg at bedtime by mouth.  . estradiol (VIVELLE-DOT) 0.025 MG/24HR APPLY 1 PATCH TWICE WEEKLY  . losartan (COZAAR) 50 MG tablet TAKE 1 TABLET BY MOUTH  DAILY  . omeprazole (PRILOSEC) 20 MG capsule TAKE 1 CAPSULE BY MOUTH  DAILY   No facility-administered encounter medications on file as of 07/25/2018.     Allergy:  Allergies  Allergen Reactions  . Codeine Itching  . Doxycycline     Per pt, she gets chest pain from doxycycline  . Penicillins Nausea And Vomiting    Social Hx:   Social History   Socioeconomic History  . Marital status: Married    Spouse name: Not on file  . Number of children: 3  . Years of education: Not on file  . Highest education level: Not on file  Occupational History  . Not on file  Social Needs  . Financial resource strain: Not on file  . Food insecurity:    Worry: Not on file    Inability: Not on file  . Transportation needs:    Medical: Not on file    Non-medical: Not on file  Tobacco Use  . Smoking status: Never Smoker  . Smokeless tobacco: Never Used  Substance and Sexual Activity  . Alcohol use: No  . Drug use: No  . Sexual activity: Yes    Birth control/protection: Post-menopausal    Comment: Hysterctomy  Lifestyle  . Physical activity:    Days per week: Not on file    Minutes per session: Not on file  . Stress: Not on file  Relationships  . Social connections:    Talks on phone: Not on file    Gets together: Not on file    Attends religious service: Not on file    Active member of club or organization: Not on file    Attends meetings of clubs or organizations: Not on file     Relationship status: Not on file  . Intimate partner violence:    Fear of current or ex partner: Not on file    Emotionally abused: Not on file    Physically abused: Not on file    Forced sexual activity: Not on file  Other Topics Concern  . Not on file  Social History Narrative  . Not on file    Past Surgical Hx:  Past Surgical History:  Procedure Laterality Date  . ABDOMINAL HYSTERECTOMY    . APPENDECTOMY     with hysterectomy  . ENDOMETRIAL ABLATION  2002  . LAPAROSCOPIC CHOLECYSTECTOMY  12/09/2016  . TUBAL LIGATION  1991  . WISDOM TOOTH EXTRACTION      Past Medical Hx:  Past Medical History:  Diagnosis Date  . Arthritis    hands  . GERD (gastroesophageal reflux disease)   . Hypertension   . Ovarian cancer (  [place]) 08/2014   mucinous ovarian cancer  . SVD (spontaneous vaginal delivery)    x 3    Past Gynecological History:  G3P3 (SVD's), tubal ligation. LMP in December 2015.  No LMP recorded. Patient has had a hysterectomy.  Family Hx:  Family History  Problem Relation Age of Onset  . Cancer Mother 63       vulvar cancer  . Lung cancer Mother 55  . Stroke Father   . Prostate cancer Maternal Uncle   . Stroke Maternal Grandmother   . Prostate cancer Maternal Grandfather   . Cervical cancer Paternal Grandmother   . Throat cancer Paternal Grandfather   . Prostate cancer Maternal Uncle   . Colon cancer Neg Hx   . Rectal cancer Neg Hx   . Stomach cancer Neg Hx     Review of Systems:  Constitutional  Feels well,    ENT Normal appearing ears and nares bilaterally Skin/Breast  No rash, sores, jaundice, itching, dryness Cardiovascular  No chest pain, shortness of breath, or edema  Pulmonary  No cough or wheeze.  Gastro Intestinal  No nausea, vomitting, or diarrhoea. No bright red blood per rectum, no abdominal pain, change in bowel movement, or constipation.  Genito Urinary  No frequency, urgency, dysuria, see HPI Musculo Skeletal  No myalgia,  arthralgia, joint swelling or pain  Neurologic  No weakness, numbness, change in gait,  Psychology  No depression, anxiety, insomnia.   Vitals:  Blood pressure 131/89, pulse 72, temperature 98.4 F (36.9 C), temperature source Oral, resp. rate 20, height 5\' 2"  (1.575 m), weight 202 lb 11.2 oz (91.9 kg), SpO2 100 %.  Physical Exam: WD in NAD Neck  Supple NROM, without any enlargements.  Lymph Node Survey No cervical supraclavicular or inguinal adenopathy Cardiovascular  Pulse normal rate, regularity and rhythm. S1 and S2 normal.  Lungs  Clear to auscultation bilateraly, without wheezes/crackles/rhonchi. Good air movement.  Skin  No rash/lesions/breakdown  Psychiatry  Alert and oriented to person, place, and time  Abdomen  Normoactive bowel sounds, abdomen soft, non-tender and mildly overweight without evidence of hernia. No palpable masses Back No CVA tenderness Genito Urinary  Vulva/vagina: Normal external female genitalia.  No lesions. No discharge or bleeding.  Bladder/urethra:  No lesions or masses, well supported bladder  Vagina: regular with no lesions  Cervix: surgically absent  Uterus: surgically absent  Adnexa:no longer able to palpate cystic mass in left pelvis Rectal  Good tone, no masses no cul de sac nodularity.  Extremities  No bilateral cyanosis, clubbing or edema.   Thereasa Solo, MD   07/25/2018, 12:18 PM

## 2018-07-25 NOTE — Patient Instructions (Signed)
Please notify Dr Denman George at phone number [phone] if you notice vaginal bleeding, new pelvic or abdominal pains, bloating, feeling full easy, or a change in bladder or bowel function.   Dr Denman George will draw your tumor markers today.  Please contact Dr Serita Grit office (at [phone]) in April, 2020 to request an appointment with her for July, 2020. You can coordinate this visit to be timed with the results from your lab draw.

## 2018-07-26 LAB — CA 125: Cancer Antigen (CA) 125: 9.9 U/mL (ref 0.0–38.1)

## 2018-07-28 ENCOUNTER — Telehealth: Payer: Self-pay | Admitting: *Deleted

## 2018-07-28 ENCOUNTER — Encounter: Payer: Self-pay | Admitting: Family Medicine

## 2018-07-28 ENCOUNTER — Ambulatory Visit (INDEPENDENT_AMBULATORY_CARE_PROVIDER_SITE_OTHER): Payer: BLUE CROSS/BLUE SHIELD | Admitting: Family Medicine

## 2018-07-28 ENCOUNTER — Telehealth: Payer: Self-pay

## 2018-07-28 VITALS — BP 122/88 | HR 97 | Ht 62.0 in | Wt 204.0 lb

## 2018-07-28 DIAGNOSIS — M7502 Adhesive capsulitis of left shoulder: Secondary | ICD-10-CM | POA: Diagnosis not present

## 2018-07-28 DIAGNOSIS — M25512 Pain in left shoulder: Secondary | ICD-10-CM | POA: Diagnosis not present

## 2018-07-28 DIAGNOSIS — M7061 Trochanteric bursitis, right hip: Secondary | ICD-10-CM | POA: Diagnosis not present

## 2018-07-28 DIAGNOSIS — G8929 Other chronic pain: Secondary | ICD-10-CM

## 2018-07-28 MED ORDER — GABAPENTIN 100 MG PO CAPS: 200.0000 mg | ORAL_CAPSULE | Freq: Every day | ORAL | 3 refills | Status: DC

## 2018-07-28 NOTE — Telephone Encounter (Signed)
Outgoing call to patient regarding CA 125 results of 9.9 - stable and within normal limits per Joylene John NP.  No answer, left VM for pt to return call to office.

## 2018-07-28 NOTE — Telephone Encounter (Signed)
Patient returned call to our office concerning her lab results.  I informed patient that per Joylene John, NP that her CA-125 results from 07/25/2018 is 9.9, stable and within normal limits. Patient verbalized understanding.

## 2018-07-28 NOTE — Assessment & Plan Note (Signed)
Repeat injection given today.  Tolerated the procedure well.  Discussed icing regimen and home exercise.  Discussed which activities to do.  Patient is to increase activity slowly over the course the next several weeks.  Follow-up with me again in 4 to 8 weeks

## 2018-07-28 NOTE — Assessment & Plan Note (Signed)
Repeat injection given today.  Tolerated the procedure well.  Has done formal physical therapy.  If continues to give her difficulty we will need advanced imaging.  Patient wants to continue with conservative therapy at this time.  Follow-up again in 6 to 8 weeks

## 2018-07-28 NOTE — Patient Instructions (Addendum)
Good to see you  Ice 20 minutes 2 times daily. Usually after activity and before bed. Exercises 3 times a week.  Gabapentin 200mg  at night Tried the injections again shoulder and the hip as well  See me again in 6 weeks

## 2018-07-28 NOTE — Progress Notes (Signed)
[name] [address] [place], [place] [address] Phone: [phone] Subjective:   Alyssa [name], am serving as a scribe for Dr. Hulan Saas.   CC: shoulder pain   [id number]  Alyssa [name] is a 58 y.o. female coming in with complaint of left shoulder pain. She said that the pain in her shoulder is radiating down her left arm for the past 2 weeks. Almost had to leave work yesterday. Does continue supplements, Pennsaid, and exercises.   Patient also has been having right SI joint pain. Pain radiates down the right leg when she is lying down. Pain catches with lumbar flexion. Has also been trying to stretch but htat is not alleviating her pain.      Past Medical History:  Diagnosis Date  . Arthritis    hands  . GERD (gastroesophageal reflux disease)   . Hypertension   . Ovarian cancer ([place]) 08/2014   mucinous ovarian cancer  . SVD (spontaneous vaginal delivery)    x 3   Past Surgical History:  Procedure Laterality Date  . ABDOMINAL HYSTERECTOMY    . APPENDECTOMY     with hysterectomy  . ENDOMETRIAL ABLATION  2002  . LAPAROSCOPIC CHOLECYSTECTOMY  12/09/2016  . TUBAL LIGATION  1991  . WISDOM TOOTH EXTRACTION     Social History   Socioeconomic History  . Marital status: Married    Spouse name: Not on file  . Number of children: 3  . Years of education: Not on file  . Highest education level: Not on file  Occupational History  . Not on file  Social Needs  . Financial resource strain: Not on file  . Food insecurity:    Worry: Not on file    Inability: Not on file  . Transportation needs:    Medical: Not on file    Non-medical: Not on file  Tobacco Use  . Smoking status: Never Smoker  . Smokeless tobacco: Never Used  Substance and Sexual Activity  . Alcohol use: [name]  . Drug use: [name]  . Sexual activity: Yes    Birth control/protection: Post-menopausal    Comment: Hysterctomy  Lifestyle  . Physical activity:    Days per  week: Not on file    Minutes per session: Not on file  . Stress: Not on file  Relationships  . Social connections:    Talks on phone: Not on file    Gets together: Not on file    Attends religious service: Not on file    Active member of club or organization: Not on file    Attends meetings of clubs or organizations: Not on file    Relationship status: Not on file  Other Topics Concern  . Not on file  Social History Narrative  . Not on file   Allergies  Allergen Reactions  . Codeine Itching  . Doxycycline     Per pt, she gets chest pain from doxycycline  . Penicillins Nausea And Vomiting   Family History  Problem Relation Age of Onset  . Cancer Mother 74       vulvar cancer  . Lung cancer Mother 95  . Stroke Father   . Prostate cancer Maternal Uncle   . Stroke Maternal Grandmother   . Prostate cancer Maternal Grandfather   . Cervical cancer Paternal Grandmother   . Throat cancer Paternal Grandfather   . Prostate cancer Maternal Uncle   . Colon cancer Neg Hx   . Rectal cancer  Neg Hx   . Stomach cancer Neg Hx     Current Outpatient Medications (Endocrine & Metabolic):  .  estradiol (VIVELLE-DOT) 0.025 MG/24HR, APPLY 1 PATCH TWICE WEEKLY  Current Outpatient Medications (Cardiovascular):  .  amLODipine (NORVASC) 5 MG tablet, TAKE 1 TABLET BY MOUTH  DAILY .  losartan (COZAAR) 50 MG tablet, TAKE 1 TABLET BY MOUTH  DAILY  Current Outpatient Medications (Respiratory):  .  diphenhydrAMINE (BENADRYL) 25 MG tablet, Take 50 mg at bedtime by mouth.    Current Outpatient Medications (Other):  Marland Kitchen  Diclofenac Sodium 2 % SOLN, Apply 1 pump twice daily as needed. Marland Kitchen  omeprazole (PRILOSEC) 20 MG capsule, TAKE 1 CAPSULE BY MOUTH  DAILY .  gabapentin (NEURONTIN) 100 MG capsule, Take 2 capsules (200 mg total) by mouth at bedtime.    Past medical history, social, surgical and family history all reviewed in electronic medical record.  [name] pertanent information unless stated regarding  to the chief complaint.   Review of Systems:  [name] headache, visual changes, nausea, vomiting, diarrhea, constipation, dizziness, abdominal pain, skin rash, fevers, chills, night sweats, weight loss, swollen lymph nodes, body aches, joint swelling,, chest pain, shortness of breath, mood changes.  Positive muscle aches  Objective  Blood pressure 122/88, pulse 97, height 5\' 2"  (1.575 m), weight 204 lb (92.5 kg), SpO2 97 %.   General: [name] apparent distress alert and oriented x3 mood and affect normal, dressed appropriately.  HEENT: Pupils equal, extraocular movements intact  Respiratory: Patient's speak in full sentences and does not appear short of breath  Cardiovascular: [name] lower extremity edema, non tender, [name] erythema  Skin: Warm dry intact with [name] signs of infection or rash on extremities or on axial skeleton.  Abdomen: Soft nontender  Neuro: Cranial nerves II through XII are intact, neurovascularly intact in all extremities with 2+ DTRs and 2+ pulses.  Lymph: [name] lymphadenopathy of posterior or anterior cervical chain or axillae bilaterally.  Gait antalgic favoring the right hip MSK:  Non tender with full range of motion and good stability and symmetric strength and tone of , elbows, wrist, and ankles bilaterally.  Left shoulder exam still has decreased range of motion but improvement in forward flexion.  Still external and internal range of motion of lacking 5 to 10 degrees.  Mild tenderness noted with impingement.  Right hip exam shows severe tenderness over the greater trochanteric area and the piriformis.  Patient has mild pain over the sacroiliac joint.  Negative straight leg test.  Procedure: Real-time Ultrasound Guided Injection of left glenohumeral joint Device: GE Logiq E  Ultrasound guided injection is preferred based studies that show increased duration, increased effect, greater accuracy, decreased procedural pain, increased response rate with ultrasound guided versus blind  injection.  Verbal informed consent obtained.  Time-out conducted.  Noted [name] overlying erythema, induration, or other signs of local infection.  Skin prepped in a sterile fashion.  Local anesthesia: Topical Ethyl chloride.  With sterile technique and under real time ultrasound guidance:  Joint visualized.  21g 2 inch needle inserted posterior approach. Pictures taken for needle placement. Patient did have injection of 2 cc of 0.5% Marcaine, and 1cc of Kenalog 40 mg/dL. Completed without difficulty  Pain immediately resolved suggesting accurate placement of the medication.  Advised to call if fevers/chills, erythema, induration, drainage, or persistent bleeding.  Images permanently stored and available for review in the ultrasound unit.  Impression: Technically successful ultrasound guided injection.    Procedure: Real-time Ultrasound Guided Injection of right  greater trochanteric bursitis secondary to patient's body habitus Device: GE Logiq Q7 Ultrasound guided injection is preferred based studies that show increased duration, increased effect, greater accuracy, decreased procedural pain, increased response rate, and decreased cost with ultrasound guided versus blind injection.  Verbal informed consent obtained.  Time-out conducted.  Noted [name] overlying erythema, induration, or other signs of local infection.  Skin prepped in a sterile fashion.  Local anesthesia: Topical Ethyl chloride.  With sterile technique and under real time ultrasound guidance:  Greater trochanteric area was visualized and patient's bursa was noted. A 22-gauge 3 inch needle was inserted and 4 cc of 0.5% Marcaine and 1 cc of Kenalog 40 mg/dL was injected. Pictures taken Completed without difficulty  Pain immediately resolved suggesting accurate placement of the medication.  Advised to call if fevers/chills, erythema, induration, drainage, or persistent bleeding.  Images permanently stored and available for review in  the ultrasound unit.  Impression: Technically successful ultrasound guided injection.   Impression and Recommendations:     This case required medical decision making of moderate complexity. The above documentation has been reviewed and is accurate and complete Lyndal Pulley, DO       Note: This dictation was prepared with Dragon dictation along with smaller phrase technology. Any transcriptional errors that result from this process are unintentional.

## 2018-08-15 ENCOUNTER — Encounter: Payer: Self-pay | Admitting: Internal Medicine

## 2018-09-07 NOTE — Progress Notes (Signed)
[name] [address], [place] [address] Phone: [phone] Subjective:   [name], am serving as a scribe for Dr. Hulan Saas.    CC: Shoulder pain follow-up  ONG:[id number]   07/28/2018: Repeat injection given today.  Tolerated the procedure well.  Has done formal physical therapy.  If continues to give her difficulty we will need advanced imaging.  Patient wants to continue with conservative therapy at this time.  Follow-up again in 6 to 8 weeks  Update 09/08/2018: Alyssa [name] is a 58 y.o. female coming in with complaint of left shoulder pain and low back pain. Patient states that her shoulder is much better. Pain occurring in the right glute and SI joint. After a day at work her pain extends into lower thoracic spine. Has used IBU more frequently due to pain.      previsou back xray independently visualized by me IMPRESSION: 1. 2.5 mm anterior slip L4 secondary to facet degenerative changes. Very mild L4-5 disc space narrowing. 2. Minimal L3-4 and L5-S1 disc space narrowing. 3. Mild narrowing posterior aspect L1-2 disc space with anterior osteophyte. Minimal rotation/retrolisthesis L1. 4. Confluent anterior osteophyte T11-12 and T12-L1.  Past Medical History:  Diagnosis Date  . Arthritis    hands  . GERD (gastroesophageal reflux disease)   . Hypertension   . 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 SVD (spontaneous vaginal delivery)    x 3   Past Surgical History:  Procedure Laterality Date  . ABDOMINAL HYSTERECTOMY    . APPENDECTOMY     with hysterectomy  . ENDOMETRIAL ABLATION  2002  . LAPAROSCOPIC CHOLECYSTECTOMY  12/09/2016  . TUBAL LIGATION  1991  . WISDOM TOOTH EXTRACTION     Social History   Socioeconomic History  . Marital status: Married    Spouse name: Not on file  . Number of children: 3  . Years of education: Not on file  . Highest education level: Not on file  Occupational  History  . Not on file  Social Needs  . Financial resource strain: Not on file  . Food insecurity:    Worry: Not on file    Inability: Not on file  . Transportation needs:    Medical: Not on file    Non-medical: Not on file  Tobacco Use  . Smoking status: Never Smoker  . Smokeless tobacco: Never Used  Substance and Sexual Activity  . Alcohol use: No  . Drug use: No  . Sexual activity: Yes    Birth control/protection: Post-menopausal    Comment: Hysterctomy  Lifestyle  . Physical activity:    Days per week: Not on file    Minutes per session: Not on file  . Stress: Not on file  Relationships  . Social connections:    Talks on phone: Not on file    Gets together: Not on file    Attends religious service: Not on file    Active member of club or organization: Not on file    Attends meetings of clubs or organizations: Not on file    Relationship status: Not on file  Other Topics Concern  . Not on file  Social History Narrative  . Not on file   Allergies  Allergen Reactions  . Codeine Itching  . Doxycycline     Per pt, she gets chest pain from doxycycline  . Penicillins Nausea And Vomiting   Family History  Problem Relation Age of  Onset  . Cancer Mother 22       vulvar cancer  . Lung cancer Mother 54  . Stroke Father   . Prostate cancer Maternal Uncle   . Stroke Maternal Grandmother   . Prostate cancer Maternal Grandfather   . Cervical cancer Paternal Grandmother   . Throat cancer Paternal Grandfather   . Prostate cancer Maternal Uncle   . Colon cancer Neg Hx   . Rectal cancer Neg Hx   . Stomach cancer Neg Hx     Current Outpatient Medications (Endocrine & Metabolic):  .  estradiol (VIVELLE-DOT) 0.025 MG/24HR, APPLY 1 PATCH TWICE WEEKLY  Current Outpatient Medications (Cardiovascular):  .  amLODipine (NORVASC) 5 MG tablet, TAKE 1 TABLET BY MOUTH  DAILY .  losartan (COZAAR) 50 MG tablet, TAKE 1 TABLET BY MOUTH  DAILY  Current Outpatient Medications  (Respiratory):  .  diphenhydrAMINE (BENADRYL) 25 MG tablet, Take 50 mg at bedtime by mouth.    Current Outpatient Medications (Other):  Marland Kitchen  Diclofenac Sodium 2 % SOLN, Apply 1 pump twice daily as needed. .  gabapentin (NEURONTIN) 100 MG capsule, Take 2 capsules (200 mg total) by mouth at bedtime. Marland Kitchen  omeprazole (PRILOSEC) 20 MG capsule, TAKE 1 CAPSULE BY MOUTH  DAILY    Past medical history, social, surgical and family history all reviewed in electronic medical record.  No pertanent information unless stated regarding to the chief complaint.   Review of Systems:  No headache, visual changes, nausea, vomiting, diarrhea, constipation, dizziness, abdominal pain, skin rash, fevers, chills, night sweats, weight loss, swollen lymph nodes, body aches, joint swelling,  chest pain, shortness of breath, mood changes.  Positive muscle aches  Objective  Blood pressure 120/86, pulse 85, height 5\' 2"  (1.575 m), weight 204 lb (92.5 kg), SpO2 98 %.   General: No apparent distress alert and oriented x3 mood and affect normal, dressed appropriately.  HEENT: Pupils equal, extraocular movements intact  Respiratory: Patient's speak in full sentences and does not appear short of breath  Cardiovascular: No lower extremity edema, non tender, no erythema  Skin: Warm dry intact with no signs of infection or rash on extremities or on axial skeleton.  Abdomen: Soft nontender  Neuro: Cranial nerves II through XII are intact, neurovascularly intact in all extremities with 2+ DTRs and 2+ pulses.  Lymph: No lymphadenopathy of posterior or anterior cervical chain or axillae bilaterally.  Gait normal with good balance and coordination.  MSK:  Non tender with full range of motion and good stability and symmetric strength and tone of  elbows, wrist, hip, knee and ankles bilaterally.  Shoulder: Left Inspection reveals no abnormalities, atrophy or asymmetry. Palpation is normal with no tenderness over AC joint or bicipital  groove. ROM is full in all planes. Rotator cuff strength normal throughout. No signs of impingement with negative Neer and Hawkin's tests, empty can sign. Speeds and Yergason's tests normal. No labral pathology noted with negative Obrien's, negative clunk and good stability. Normal scapular function observed. No painful arc and no drop arm sign. No apprehension sign  Contralateral shoulder unremarkable  Back Exam:  Inspection: Loss of lordosis Motion: Flexion 45 deg, Extension 25 deg, Side Bending to 35 deg bilaterally,  Rotation to 35 deg bilaterally  SLR laying: Negative  XSLR laying: Negative  Palpable tenderness: Tender to palpation in the paraspinal musculature.Marland Kitchen FABER: Positive Faber on the right. Sensory change: Gross sensation intact to all lumbar and sacral dermatomes.  Reflexes: 2+ at both patellar tendons, 2+ at  achilles tendons, Babinski's downgoing.  Strength at foot  Plantar-flexion: 5/5 Dorsi-flexion: 5/5 Eversion: 5/5 Inversion: 5/5  Leg strength  Quad: 5/5 Hamstring: 5/5 Hip flexor: 5/5 Hip abductors: 5/5  Gait unremarkable.   [id number]; 15 additional minutes spent for Therapeutic exercises as stated in above notes.  This included exercises focusing on stretching, strengthening, with significant focus on eccentric aspects.   Long term goals include an improvement in range of motion, strength, endurance as well as avoiding reinjury. Patient's frequency would include in 1-2 times a day, 3-5 times a week for a duration of 6-12 weeks. Low back exercises that included:  Pelvic tilt/bracing instruction to focus on control of the pelvic girdle and lower abdominal muscles  Glute strengthening exercises, focusing on proper firing of the glutes without engaging the low back muscles Proper stretching techniques for maximum relief for the hamstrings, hip flexors, low back and some rotation where tolerated  Proper technique shown and discussed handout in great detail with ATC.  All  questions were discussed and answered.       Impression and Recommendations:     This case required medical decision making of moderate complexity. The above documentation has been reviewed and is accurate and complete Lyndal Pulley, DO       Note: This dictation was prepared with Dragon dictation along with smaller phrase technology. Any transcriptional errors that result from this process are unintentional.

## 2018-09-08 ENCOUNTER — Ambulatory Visit (INDEPENDENT_AMBULATORY_CARE_PROVIDER_SITE_OTHER): Payer: BLUE CROSS/BLUE SHIELD | Admitting: Family Medicine

## 2018-09-08 ENCOUNTER — Encounter: Payer: Self-pay | Admitting: Family Medicine

## 2018-09-08 DIAGNOSIS — M545 Low back pain, unspecified: Secondary | ICD-10-CM

## 2018-09-08 DIAGNOSIS — M7502 Adhesive capsulitis of left shoulder: Secondary | ICD-10-CM | POA: Diagnosis not present

## 2018-09-08 NOTE — Assessment & Plan Note (Signed)
Midline low back pain without sciatica.  Patient's pain seems to be more on the right side.  X-rays did show spondylolisthesis at L4-L5 that does correspond with some of the radicular symptoms wrapping around the thigh from time to time.  No significant weakness though noted.  Home exercises given, topical anti-inflammatories, which activities to do.  Discussed continuing the gabapentin and possible titration during the day.  Follow-up again in 4 to 8 weeks

## 2018-09-08 NOTE — Patient Instructions (Signed)
Good to see you  Try 1 gabapentin during the day  Ice is your friend pennsaid pinkie amount topically 2 times daily as needed.  Exercises 3 times a week.  [employer] orthotics "total support" online would be great  Take out the insoles and do not lace last eye  See me again in 4-6 weeks to make sure you are doing better

## 2018-09-08 NOTE — Assessment & Plan Note (Signed)
Significant improvement at this time.  Discussed icing regimen and home exercise.  Which activities to do which wants to avoid.  Follow-up again in 4 to 8 weeks

## 2018-09-15 ENCOUNTER — Other Ambulatory Visit: Payer: Self-pay | Admitting: Internal Medicine

## 2018-09-16 ENCOUNTER — Other Ambulatory Visit: Payer: Self-pay

## 2018-09-16 ENCOUNTER — Encounter: Payer: Self-pay | Admitting: Family Medicine

## 2018-09-16 ENCOUNTER — Ambulatory Visit (INDEPENDENT_AMBULATORY_CARE_PROVIDER_SITE_OTHER): Payer: BLUE CROSS/BLUE SHIELD | Admitting: Family Medicine

## 2018-09-16 VITALS — BP 104/78 | HR 75 | Ht 62.0 in | Wt 207.0 lb

## 2018-09-16 DIAGNOSIS — G8929 Other chronic pain: Secondary | ICD-10-CM | POA: Diagnosis not present

## 2018-09-16 DIAGNOSIS — M545 Low back pain, unspecified: Secondary | ICD-10-CM

## 2018-09-16 DIAGNOSIS — M5416 Radiculopathy, lumbar region: Secondary | ICD-10-CM

## 2018-09-16 MED ORDER — TIZANIDINE HCL 4 MG PO TABS: 4.0000 mg | ORAL_TABLET | Freq: Three times a day (TID) | ORAL | 0 refills | Status: DC | PRN

## 2018-09-16 MED ORDER — METHYLPREDNISOLONE ACETATE 80 MG/ML IJ SUSP
80.0000 mg | Freq: Once | INTRAMUSCULAR | Status: AC
  Administered 2018-09-16: 80 mg via INTRAMUSCULAR

## 2018-09-16 MED ORDER — KETOROLAC TROMETHAMINE 60 MG/2ML IM SOLN
60.0000 mg | Freq: Once | INTRAMUSCULAR | Status: AC
  Administered 2018-09-16: 60 mg via INTRAMUSCULAR

## 2018-09-16 NOTE — Progress Notes (Signed)
[name] [address], [place] [address] Phone: [phone] Subjective:      CC: Back pain follow-up  JSE:[id number]    Midline low back pain without sciatica.  Patient's pain seems to be more on the right side.  X-rays did show spondylolisthesis at L4-L5 that does correspond with some of the radicular symptoms wrapping around the thigh from time to time.  No significant weakness though noted.  Home exercises given, topical anti-inflammatories, which activities to do.  Discussed continuing the gabapentin and possible titration during the day.  Follow-up again in 4 to 8 weeks  Update 09/16/2018: Alyssa [name] is a 58 y.o. female coming in with complaint of left shoulder pain and back pain. Patient is having acute spasm of her lower back, right side greater than left. Denies any radiating pain. Pain is 8/10 was at 10/10 last night. Patient awoke last night 1:45am in pain.  Patient does not remember any true injury.  Has been doing a little bit more work than day before.  Patient denies significant radiation down the legs but some mild.  Denies any weakness.  Rates the severity of pain 8 out of 10 though.  Had difficulty even getting off of her toilet this morning.     Past Medical History:  Diagnosis Date  . Arthritis    hands  . GERD (gastroesophageal reflux disease)   . Hypertension   . 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 SVD (spontaneous vaginal delivery)    x 3   Past Surgical History:  Procedure Laterality Date  . ABDOMINAL HYSTERECTOMY    . APPENDECTOMY     with hysterectomy  . ENDOMETRIAL ABLATION  2002  . LAPAROSCOPIC CHOLECYSTECTOMY  12/09/2016  . TUBAL LIGATION  1991  . WISDOM TOOTH EXTRACTION     Social History   Socioeconomic History  . Marital status: Married    Spouse name: Not on file  . Number of children: 3  . Years of education: Not on file  . Highest education level: Not on file   Occupational History  . Not on file  Social Needs  . Financial resource strain: Not on file  . Food insecurity:    Worry: Not on file    Inability: Not on file  . Transportation needs:    Medical: Not on file    Non-medical: Not on file  Tobacco Use  . Smoking status: Never Smoker  . Smokeless tobacco: Never Used  Substance and Sexual Activity  . Alcohol use: No  . Drug use: No  . Sexual activity: Yes    Birth control/protection: Post-menopausal    Comment: Hysterctomy  Lifestyle  . Physical activity:    Days per week: Not on file    Minutes per session: Not on file  . Stress: Not on file  Relationships  . Social connections:    Talks on phone: Not on file    Gets together: Not on file    Attends religious service: Not on file    Active member of club or organization: Not on file    Attends meetings of clubs or organizations: Not on file    Relationship status: Not on file  Other Topics Concern  . Not on file  Social History Narrative  . Not on file   Allergies  Allergen Reactions  . Codeine Itching  . Doxycycline     Per pt, she gets chest pain from doxycycline  .  Penicillins Nausea And Vomiting   Family History  Problem Relation Age of Onset  . Cancer Mother 74       vulvar cancer  . Lung cancer Mother 42  . Stroke Father   . Prostate cancer Maternal Uncle   . Stroke Maternal Grandmother   . Prostate cancer Maternal Grandfather   . Cervical cancer Paternal Grandmother   . Throat cancer Paternal Grandfather   . Prostate cancer Maternal Uncle   . Colon cancer Neg Hx   . Rectal cancer Neg Hx   . Stomach cancer Neg Hx     Current Outpatient Medications (Endocrine & Metabolic):  .  estradiol (VIVELLE-DOT) 0.025 MG/24HR, APPLY 1 PATCH TWICE WEEKLY  Current Outpatient Medications (Cardiovascular):  .  amLODipine (NORVASC) 5 MG tablet, TAKE 1 TABLET BY MOUTH  DAILY .  losartan (COZAAR) 50 MG tablet, TAKE 1 TABLET BY MOUTH  DAILY  Current Outpatient  Medications (Respiratory):  .  diphenhydrAMINE (BENADRYL) 25 MG tablet, Take 50 mg at bedtime by mouth.    Current Outpatient Medications (Other):  Marland Kitchen  Diclofenac Sodium 2 % SOLN, Apply 1 pump twice daily as needed. .  gabapentin (NEURONTIN) 100 MG capsule, Take 2 capsules (200 mg total) by mouth at bedtime. Marland Kitchen  omeprazole (PRILOSEC) 20 MG capsule, TAKE 1 CAPSULE BY MOUTH  DAILY .  tiZANidine (ZANAFLEX) 4 MG tablet, Take 1 tablet (4 mg total) by mouth every 8 (eight) hours as needed for muscle spasms.    Past medical history, social, surgical and family history all reviewed in electronic medical record.  No pertanent information unless stated regarding to the chief complaint.   Review of Systems:  No headache, visual changes, nausea, vomiting, diarrhea, constipation, dizziness, abdominal pain, skin rash, fevers, chills, night sweats, weight loss, swollen lymph nodes, body aches, joint swelling,  chest pain, shortness of breath, mood changes.  Positive muscle aches  Objective  Blood pressure 104/78, pulse 75, height 5\' 2"  (1.575 m), weight 207 lb (93.9 kg), SpO2 98 %. Systems examined below as of    General: No apparent distress alert and oriented x3 mood and affect normal, dressed appropriately.  HEENT: Pupils equal, extraocular movements intact  Respiratory: Patient's speak in full sentences and does not appear short of breath  Cardiovascular: No lower extremity edema, non tender, no erythema  Skin: Warm dry intact with no signs of infection or rash on extremities or on axial skeleton.  Abdomen: Soft nontender  Neuro: Cranial nerves II through XII are intact, neurovascularly intact in all extremities with 2+ DTRs and 2+ pulses.  Lymph: No lymphadenopathy of posterior or anterior cervical chain or axillae bilaterally.  Gait normal with good balance and coordination.  MSK:  Non tender with full range of motion and good stability and symmetric strength and tone of shoulders, elbows, wrist,  hip, knee and ankles bilaterally.   Back Exam:  Inspection: Loss of lordosis Motion: Flexion 45 deg, Extension 25deg, Side Bending to 35 deg bilaterally,  Rotation to 45 deg bilaterally  SLR laying: Negative severe tightness of the hamstrings XSLR laying: Negative  Palpable tenderness: Severe tenderness in the paraspinal musculature.Marland Kitchen FABER: Positive bilateral. Sensory change: Gross sensation intact to all lumbar and sacral dermatomes.  Reflexes: 2+ at both patellar tendons, 2+ at achilles tendons, Babinski's downgoing.  Strength at foot  Plantar-flexion: 5/5 Dorsi-flexion: 5/5 Eversion: 5/5 Inversion: 5/5  Leg strength  Quad: 5/5 Hamstring: 5/5 Hip flexor: 5/5 Hip abductors: 5/5  Gait unremarkable.    Impression and  Recommendations:     This case required medical decision making of moderate complexity. The above documentation has been reviewed and is accurate and complete Lyndal Pulley, DO       Note: This dictation was prepared with Dragon dictation along with smaller phrase technology. Any transcriptional errors that result from this process are unintentional.

## 2018-09-16 NOTE — Assessment & Plan Note (Signed)
Neck low back pain.  X-rays did not show any type of cancer previously do not feel that further imaging is necessary.  Patient will be treated for an exacerbation with a muscle relaxant.  We discussed also over-the-counter medication.  Encouraged to continue the gabapentin.  I do not want to do prednisone with recent outbreak of coronavirus.  Follow-up with me again in 4 to 8 weeks.

## 2018-09-16 NOTE — Patient Instructions (Addendum)
Good to see you  Ice 20 minutes 2 times daily. Usually after activity and before bed. Exercises 3 times a week.  Zanaflex at night  See me again in 2 months if doing well or call us if worsening

## 2018-10-01 ENCOUNTER — Telehealth: Payer: Self-pay

## 2018-10-01 NOTE — Telephone Encounter (Signed)
Patient had an appointment with you on Friday 4/3 at 9:45. She did not want to do virtual visit and asked if you would do a phone call with her.

## 2018-10-01 NOTE — Telephone Encounter (Signed)
Ok - visit is for 6 month f/u

## 2018-10-01 NOTE — Telephone Encounter (Signed)
Called pt to let her know. Pt states she is going to try to download the app and call back tomorrow if she has any problems.

## 2018-10-01 NOTE — Telephone Encounter (Signed)
If it is just for follow up no b/c telephone calls are only for acute issues.    For f/u she will need to reschedule

## 2018-10-03 ENCOUNTER — Ambulatory Visit: Payer: BLUE CROSS/BLUE SHIELD | Admitting: Internal Medicine

## 2018-10-12 NOTE — Progress Notes (Deleted)
[name] [address], [place] [address] Phone: [phone] Subjective:    I'm seeing this patient by the request  of:    CC:   [id number]  Alyssa [name] is a 58 y.o. female coming in with complaint of ***  Onset-  Location Duration-  Character- Aggravating factors- Reliving factors-  Therapies tried-  Severity-     Past Medical History:  Diagnosis Date  . Arthritis    hands  . GERD (gastroesophageal reflux disease)   . Hypertension   . 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 SVD (spontaneous vaginal delivery)    x 3   Past Surgical History:  Procedure Laterality Date  . ABDOMINAL HYSTERECTOMY    . APPENDECTOMY     with hysterectomy  . ENDOMETRIAL ABLATION  2002  . LAPAROSCOPIC CHOLECYSTECTOMY  12/09/2016  . TUBAL LIGATION  1991  . WISDOM TOOTH EXTRACTION     Social History   Socioeconomic History  . Marital status: Married    Spouse name: Not on file  . Number of children: 3  . Years of education: Not on file  . Highest education level: Not on file  Occupational History  . Not on file  Social Needs  . Financial resource strain: Not on file  . Food insecurity:    Worry: Not on file    Inability: Not on file  . Transportation needs:    Medical: Not on file    Non-medical: Not on file  Tobacco Use  . Smoking status: Never Smoker  . Smokeless tobacco: Never Used  Substance and Sexual Activity  . Alcohol use: No  . Drug use: No  . Sexual activity: Yes    Birth control/protection: Post-menopausal    Comment: Hysterctomy  Lifestyle  . Physical activity:    Days per week: Not on file    Minutes per session: Not on file  . Stress: Not on file  Relationships  . Social connections:    Talks on phone: Not on file    Gets together: Not on file    Attends religious service: Not on file    Active member of club or organization: Not on file    Attends meetings of clubs or organizations:  Not on file    Relationship status: Not on file  Other Topics Concern  . Not on file  Social History Narrative  . Not on file   Allergies  Allergen Reactions  . Codeine Itching  . Doxycycline     Per pt, she gets chest pain from doxycycline  . Penicillins Nausea And Vomiting   Family History  Problem Relation Age of Onset  . Cancer Mother 26       vulvar cancer  . Lung cancer Mother 103  . Stroke Father   . Prostate cancer Maternal Uncle   . Stroke Maternal Grandmother   . Prostate cancer Maternal Grandfather   . Cervical cancer Paternal Grandmother   . Throat cancer Paternal Grandfather   . Prostate cancer Maternal Uncle   . Colon cancer Neg Hx   . Rectal cancer Neg Hx   . Stomach cancer Neg Hx     Current Outpatient Medications (Endocrine & Metabolic):  .  estradiol (VIVELLE-DOT) 0.025 MG/24HR, APPLY 1 PATCH TWICE WEEKLY  Current Outpatient Medications (Cardiovascular):  .  amLODipine (NORVASC) 5 MG tablet, TAKE 1 TABLET BY MOUTH  DAILY .  losartan (COZAAR) 50 MG tablet, TAKE 1 TABLET  BY MOUTH  DAILY  Current Outpatient Medications (Respiratory):  .  diphenhydrAMINE (BENADRYL) 25 MG tablet, Take 50 mg at bedtime by mouth.    Current Outpatient Medications (Other):  Marland Kitchen  Diclofenac Sodium 2 % SOLN, Apply 1 pump twice daily as needed. .  gabapentin (NEURONTIN) 100 MG capsule, Take 2 capsules (200 mg total) by mouth at bedtime. Marland Kitchen  omeprazole (PRILOSEC) 20 MG capsule, TAKE 1 CAPSULE BY MOUTH  DAILY .  tiZANidine (ZANAFLEX) 4 MG tablet, Take 1 tablet (4 mg total) by mouth every 8 (eight) hours as needed for muscle spasms.    Past medical history, social, surgical and family history all reviewed in electronic medical record.  No pertanent information unless stated regarding to the chief complaint.   Review of Systems:  No headache, visual changes, nausea, vomiting, diarrhea, constipation, dizziness, abdominal pain, skin rash, fevers, chills, night sweats, weight loss,  swollen lymph nodes, body aches, joint swelling, muscle aches, chest pain, shortness of breath, mood changes.   Objective  There were no vitals taken for this visit. Systems examined below as of    General: No apparent distress alert and oriented x3 mood and affect normal, dressed appropriately.  HEENT: Pupils equal, extraocular movements intact  Respiratory: Patient's speak in full sentences and does not appear short of breath  Cardiovascular: No lower extremity edema, non tender, no erythema  Skin: Warm dry intact with no signs of infection or rash on extremities or on axial skeleton.  Abdomen: Soft nontender  Neuro: Cranial nerves II through XII are intact, neurovascularly intact in all extremities with 2+ DTRs and 2+ pulses.  Lymph: No lymphadenopathy of posterior or anterior cervical chain or axillae bilaterally.  Gait normal with good balance and coordination.  MSK:  Non tender with full range of motion and good stability and symmetric strength and tone of shoulders, elbows, wrist, hip, knee and ankles bilaterally.     Impression and Recommendations:     This case required medical decision making of moderate complexity. The above documentation has been reviewed and is accurate and complete Alyssa Pulley, DO       Note: This dictation was prepared with Dragon dictation along with smaller phrase technology. Any transcriptional errors that result from this process are unintentional.

## 2018-10-13 ENCOUNTER — Ambulatory Visit: Payer: BLUE CROSS/BLUE SHIELD | Admitting: Family Medicine

## 2018-11-20 ENCOUNTER — Other Ambulatory Visit: Payer: Self-pay | Admitting: Internal Medicine

## 2018-11-20 DIAGNOSIS — N838 Other noninflammatory disorders of ovary, fallopian tube and broad ligament: Secondary | ICD-10-CM

## 2018-11-21 NOTE — Telephone Encounter (Signed)
LVM letting pt know that she is due for a 6 month follow and to call to schedule.

## 2018-11-28 ENCOUNTER — Ambulatory Visit: Payer: Self-pay

## 2018-11-28 NOTE — Telephone Encounter (Signed)
Pt. called to report onset of cough and body aches yesterday.  Reported she had Advil 2 tablets at 9:30 AM, for body aches. Temp. 99.2 @ 1:30 PM. C/o mild sore throat, and runny nose.  Stated she noticed mild wheezing during the night.  Stated hx of asthma, but has not had flare up in a very long time.  Coughing intermittently; coughing up clear mucus.  Denied any chest tightness or shortness of breath; also denied wheezing today.  Stated she "feels terrible" today, compared to yesterday.  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  Is not aware of direct exposure to anyone with COVID 19; denied travel in past 14 days.  Advised on home care treatment.  Scheduled appt. for virtual visit, tomorrow, at 9:20 AM.  Stated she does not have a computer or smart phone.  Advised the office will contact her in the morning re: how to proceed with the appt.  Pt. Agrees with plan.    Reason for Disposition . [1] COVID-19 infection diagnosed or suspected AND [2] mild symptoms (fever, cough) AND [3] no trouble breathing or other complications    Onset of cough and body aches yesterday; feels worse today; temp. 99.2  Answer Assessment - Initial Assessment Questions 1. COVID-19 DIAGNOSIS: "Who made your Coronavirus (COVID-19) diagnosis?" "Was it confirmed by a positive lab test?" If not diagnosed by a HCP, ask "Are there lots of cases (community spread) where you live?" (See public health department website, if unsure)   * MAJOR community spread: high number of cases; numbers of cases are increasing; many people hospitalized.   * MINOR community spread: low number of cases; not increasing; few or no people hospitalized     Present in Community  2. ONSET: "When did the COVID-19 symptoms start?"      Cough and feeling bad started yesterday 3. WORST SYMPTOM: "What is your worst symptom?" (e.g., cough, fever, shortness of breath, muscle aches)   Body aches; "feels sort of like the flu"  4. COUGH: "Do you have a cough?" If so, ask: "How bad is the cough?"      Intermittent cough- "occasional" ; coughing up small amts. Clear mucus 5. FEVER: "Do you have a fever?" If so, ask: "What is your temperature, how was it measured, and when did it start?"    Temp. 99.2 @ 1:30 PM; last dose of medication at 9:30 AM (Advil 2 tabs)  6. RESPIRATORY STATUS: "Describe your breathing?" (e.g., shortness of breath, wheezing, unable to speak)     Denied shortness of breath;  Stated she noted some "mild wheezing" during the night   7. BETTER-SAME-WORSE: "Are you getting better, staying the same or getting worse compared to yesterday?"  If getting worse, ask, "In what way?"     Feels worse compared to yesterday  8. HIGH RISK DISEASE: "Do you have any chronic medical problems?" (e.g., asthma, heart or lung disease, weak immune system, etc.)     Hypertension; overweight; hx of Asthma- no flare ups for a very long time 9. PREGNANCY: "Is there any chance you are pregnant?" "When was your last menstrual period?"    N/a  10. OTHER SYMPTOMS: "Do you have any other symptoms?"  (e.g., runny nose, headache, sore throat, loss of smell)       Denied headache or loss of sense of taste or smell; denied chest tightness;  mild sore throat  today; runny nose yesteday,  Protocols used: CORONAVIRUS (COVID-19) DIAGNOSED OR SUSPECTED-A-AH

## 2018-11-29 ENCOUNTER — Ambulatory Visit (INDEPENDENT_AMBULATORY_CARE_PROVIDER_SITE_OTHER): Payer: BLUE CROSS/BLUE SHIELD | Admitting: Family Medicine

## 2018-11-29 ENCOUNTER — Other Ambulatory Visit: Payer: Self-pay

## 2018-11-29 ENCOUNTER — Encounter: Payer: Self-pay | Admitting: Family Medicine

## 2018-11-29 VITALS — Temp 99.4°F

## 2018-11-29 DIAGNOSIS — R52 Pain, unspecified: Secondary | ICD-10-CM | POA: Diagnosis not present

## 2018-11-29 DIAGNOSIS — R070 Pain in throat: Secondary | ICD-10-CM

## 2018-11-29 DIAGNOSIS — Z03818 Encounter for observation for suspected exposure to other biological agents ruled out: Secondary | ICD-10-CM | POA: Diagnosis not present

## 2018-11-29 DIAGNOSIS — R509 Fever, unspecified: Secondary | ICD-10-CM | POA: Diagnosis not present

## 2018-11-29 DIAGNOSIS — R05 Cough: Secondary | ICD-10-CM

## 2018-11-29 DIAGNOSIS — Z20828 Contact with and (suspected) exposure to other viral communicable diseases: Secondary | ICD-10-CM

## 2018-11-29 DIAGNOSIS — M791 Myalgia, unspecified site: Secondary | ICD-10-CM

## 2018-11-29 DIAGNOSIS — Z20822 Contact with and (suspected) exposure to covid-19: Secondary | ICD-10-CM

## 2018-11-29 DIAGNOSIS — R0981 Nasal congestion: Secondary | ICD-10-CM | POA: Diagnosis not present

## 2018-11-29 NOTE — Progress Notes (Signed)
Virtual Visit via Video   I connected with patient on 11/29/18 at  9:20 AM EDT by a video enabled telemedicine application and verified that I am speaking with the correct person using two identifiers.  Location patient: Home Location provider: [facility]up[facility], Office Persons participating in the virtual visit: Patient, Provider, Travilah (Jess B)  I discussed the limitations of evaluation and management by telemedicine and the availability of in person appointments. The patient expressed understanding and agreed to proceed.  Interactive audio and video telecommunications were attempted between this provider and patient, however failed, due to patient having technical difficulties OR patient did not have access to video capability.  We continued and completed visit with audio only.   Subjective:   HPI:   URI- 'i'm real achy, my throat is sore, my head is splittin' today'.  sxs started 2 days ago, 'I just didn't feel good'.  sxs started w/ body aches.  Mild cough.  No SOB.  'it feels like the flu'.  Pt works at [employer] and deal w/ the public.    ROS:   See pertinent positives and negatives per HPI.  Patient Active Problem List   Diagnosis Date Noted  . 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 Greater trochanteric bursitis of right hip 04/28/2018  . Chronic low back pain 04/10/2018  . Left shoulder pain 04/10/2018  . Palpitations 10/11/2017  . 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 Midline low back pain without sciatica 10/11/2017  . Genetic testing 12/27/2016  . Prediabetes 10/04/2016  . Right hip pain 06/19/2016  . 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 Family history of prostate cancer 12/01/2015  . Esophageal reflux 09/15/2015  . Obesity (BMI 30-39.9) 06/03/2015  . Essential hypertension 11/11/2014  . Chemotherapy-induced peripheral neuropathy ([place]) 10/18/2014  . Vasomotor instability 10/05/2014  . 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  Surgical menopause 08/24/2014  . Ovarian cancer ([place]) 08/02/2014    Social History   Tobacco Use  . Smoking status: Never Smoker  . Smokeless tobacco: Never Used  Substance Use Topics  . Alcohol use: No    Current Outpatient Medications:  .  amLODipine (NORVASC) 5 MG tablet, TAKE 1 TABLET BY MOUTH  DAILY, Disp: 90 tablet, Rfl: 1 .  Diclofenac Sodium 2 % SOLN, Apply 1 pump twice daily as needed., Disp: 112 g, Rfl: 3 .  diphenhydrAMINE (BENADRYL) 25 MG tablet, Take 50 mg at bedtime by mouth., Disp: , Rfl:  .  estradiol (VIVELLE-DOT) 0.025 MG/24HR, APPLY 1 PATCH TWICE WEEKLY, Disp: 24 patch, Rfl: 11 .  losartan (COZAAR) 50 MG tablet, TAKE 1 TABLET BY MOUTH  DAILY, Disp: 90 tablet, Rfl: 1 .  omeprazole (PRILOSEC) 20 MG capsule, TAKE 1 CAPSULE BY MOUTH  DAILY, Disp: 90 capsule, Rfl: 3 .  gabapentin (NEURONTIN) 100 MG capsule, Take 2 capsules (200 mg total) by mouth at bedtime. (Patient not taking: Reported on 11/29/2018), Disp: 60 capsule, Rfl: 3 .  tiZANidine (ZANAFLEX) 4 MG tablet, Take 1 tablet (4 mg total) by mouth every 8 (eight) hours as needed for muscle spasms. (Patient not taking: Reported on 11/29/2018), Disp: 60 tablet, Rfl: 0  Allergies  Allergen Reactions  . Codeine Itching  . Doxycycline     Per pt, she gets chest pain from doxycycline  . Penicillins Nausea And Vomiting    Objective:   Temp 99.4 F (37.4 C) (Oral)   Pt is able to speak clearly, coherently without shortness of breath  or increased work of breathing. Thought process is linear.  Mood is appropriate.   Assessment and Plan:   Suspected COVID- pt works with public and has likely been exposed.  She reports that her sxs feel consistent w/ the flu, which is concerning for COVID (HA, body aches).  Discussed testing- she states there is a [facility] testing site 2 miles up the road.  Not feeling well enough to drive from [place] to [employer]- which is understandable.  Encouraged fluids, rest, tylenol for HA/body  aches.  She is to call PCP if sxs worsen or she develops SOB.  Pt expressed understanding and is in agreement w/ plan.    Annye Asa, MD 11/29/2018

## 2018-11-29 NOTE — Progress Notes (Signed)
I have discussed the procedure for the virtual visit with the patient who has given consent to proceed with assessment and treatment.   Jessica L Brodmerkel, CMA     

## 2018-12-18 ENCOUNTER — Other Ambulatory Visit: Payer: Self-pay | Admitting: Internal Medicine

## 2018-12-18 DIAGNOSIS — N838 Other noninflammatory disorders of ovary, fallopian tube and broad ligament: Secondary | ICD-10-CM

## 2018-12-27 NOTE — Progress Notes (Signed)
Virtual Visit via Video Note  I connected with Alyssa [name] on 12/29/18 at 11:00 AM EDT by a video enabled telemedicine application and verified that I am speaking with the correct person using two identifiers.  Location: Patient: In home setting Provider: In office setting   I discussed the limitations of evaluation and management by telemedicine and the availability of in person appointments. The patient expressed understanding and agreed to proceed.  History of Present Illness: Patient is a very pleasant 58 year old female with a past medical history significant for ovarian cancer status post chemotherapy with chronic low back pain and hip pain.  Has been diagnosed with more of a greater trochanteric bursitis previously.  Patient feels that the pain is more on the lateral aspect of the hips but also having more back pain.  Patient is no longer taking the medication she was supposed to be taken 3 months ago.  Feels like it is worsening again.  Patient denies any fevers chills or any abnormal weight loss and actually states more weight gain recently.  Past Medical History:  Diagnosis Date  . Arthritis    hands  . GERD (gastroesophageal reflux disease)   . Hypertension   . Ovarian cancer ([place]) 08/2014   mucinous ovarian cancer  . SVD (spontaneous vaginal delivery)    x 3   Past Surgical History:  Procedure Laterality Date  . ABDOMINAL HYSTERECTOMY    . APPENDECTOMY     with hysterectomy  . ENDOMETRIAL ABLATION  2002  . LAPAROSCOPIC CHOLECYSTECTOMY  12/09/2016  . TUBAL LIGATION  1991  . WISDOM TOOTH EXTRACTION     Social History   Tobacco Use  . Smoking status: Never Smoker  . Smokeless tobacco: Never Used  Substance Use Topics  . Alcohol use: No  . Drug use: No   Family History  Problem Relation Age of Onset  . Cancer Mother 53       vulvar cancer  . Lung cancer Mother 55  . Stroke Father   . Prostate cancer Maternal Uncle   . Stroke Maternal Grandmother   .  Prostate cancer Maternal Grandfather   . Cervical cancer Paternal Grandmother   . Throat cancer Paternal Grandfather   . Prostate cancer Maternal Uncle   . Colon cancer Neg Hx   . Rectal cancer Neg Hx   . Stomach cancer Neg Hx        Observations/Objective: Patient is alert and oriented x3.  Had difficulty with the video portion of the virtual platform.  Patient was able to describe very well where the pain was.   Assessment and Plan: Back pain with radicular symptoms.  Concern for possible lumbar radiculopathy or also the possibility of bursitis.  We discussed the possibility of injections, home exercises and icing regimen.  Follow-up again in 3 to 4 weeks   Follow Up Instructions: 3 to 4-week follow-up    I discussed the assessment and treatment plan with the patient. The patient was provided an opportunity to ask questions and all were answered. The patient agreed with the plan and demonstrated an understanding of the instructions.   The patient was advised to call back or seek an in-person evaluation if the symptoms worsen or if the condition fails to improve as anticipated.  I provided 26 minutes of non-face-to-face time during this encounter.   Lyndal Pulley, DO

## 2018-12-29 ENCOUNTER — Ambulatory Visit (INDEPENDENT_AMBULATORY_CARE_PROVIDER_SITE_OTHER): Payer: BC Managed Care – PPO | Admitting: Family Medicine

## 2018-12-29 ENCOUNTER — Encounter: Payer: Self-pay | Admitting: Family Medicine

## 2018-12-29 DIAGNOSIS — M545 Low back pain, unspecified: Secondary | ICD-10-CM

## 2018-12-29 DIAGNOSIS — G8929 Other chronic pain: Secondary | ICD-10-CM | POA: Diagnosis not present

## 2018-12-29 MED ORDER — PENNSAID 2 % TD SOLN: 2.0000 g | Freq: Two times a day (BID) | TRANSDERMAL | 3 refills | Status: DC

## 2018-12-29 MED ORDER — MELOXICAM 15 MG PO TABS: 15.0000 mg | ORAL_TABLET | Freq: Every day | ORAL | 0 refills | Status: DC

## 2018-12-29 MED ORDER — GABAPENTIN 100 MG PO CAPS: 200.0000 mg | ORAL_CAPSULE | Freq: Every day | ORAL | 3 refills | Status: DC

## 2018-12-29 NOTE — Patient Instructions (Signed)
Virtual  Meloxicam daily for 10 days then as needed Gabapentin at night If not better come in for GT injection then if not better MRI of lumbar

## 2018-12-29 NOTE — Assessment & Plan Note (Signed)
Patient's past medical history is significant for ovarian cancer Patient's x-rays do not show any type of bony abnormality.  Patient is responding to someone conservative therapy but has discontinued the medications.  Patient meloxicam short-term, has a muscle relaxer that can him for breakthrough, and gabapentin given also that I think will be beneficial.  We discussed with patient about weight loss, patient declined formal physical therapy secondary to the coronavirus.  Discussed the possibility of having injections in the greater trochanteric area bilaterally as needed.  Worsening pain will consider doing that again.  Otherwise.  Patient will follow-up with me again in 3 to 4 weeks

## 2019-01-20 ENCOUNTER — Telehealth: Payer: Self-pay | Admitting: *Deleted

## 2019-01-20 NOTE — Telephone Encounter (Signed)
Returned the patient's call and moved her lab appt for 7/24 to the afternoon. Also scheduled the patient to see Dr.Rossi on 8/3

## 2019-01-23 ENCOUNTER — Inpatient Hospital Stay: Payer: BC Managed Care – PPO | Attending: Gynecologic Oncology

## 2019-01-23 ENCOUNTER — Other Ambulatory Visit: Payer: Self-pay

## 2019-01-23 DIAGNOSIS — C569 Malignant neoplasm of unspecified ovary: Secondary | ICD-10-CM

## 2019-01-23 DIAGNOSIS — Z8542 Personal history of malignant neoplasm of other parts of uterus: Secondary | ICD-10-CM | POA: Diagnosis not present

## 2019-01-23 LAB — CEA (IN HOUSE-CHCC): CEA (CHCC-In House): 1.05 ng/mL (ref 0.00–5.00)

## 2019-01-24 ENCOUNTER — Other Ambulatory Visit: Payer: Self-pay | Admitting: Family Medicine

## 2019-01-24 LAB — CA 125: Cancer Antigen (CA) 125: 9.7 U/mL (ref 0.0–38.1)

## 2019-01-26 ENCOUNTER — Telehealth: Payer: Self-pay

## 2019-01-26 NOTE — Telephone Encounter (Signed)
Told Ms Rebman that the CEA and CA=125 were WNL.  Keep appointment on 02-02-19 as scheduled.

## 2019-02-02 ENCOUNTER — Encounter: Payer: Self-pay | Admitting: Gynecologic Oncology

## 2019-02-02 ENCOUNTER — Inpatient Hospital Stay: Payer: BC Managed Care – PPO | Attending: Gynecologic Oncology | Admitting: Gynecologic Oncology

## 2019-02-02 ENCOUNTER — Other Ambulatory Visit: Payer: Self-pay

## 2019-02-02 VITALS — BP 122/79 | HR 77 | Temp 98.3°F | Resp 18 | Ht 62.0 in | Wt 209.2 lb

## 2019-02-02 DIAGNOSIS — Z79899 Other long term (current) drug therapy: Secondary | ICD-10-CM | POA: Insufficient documentation

## 2019-02-02 DIAGNOSIS — C562 Malignant neoplasm of left ovary: Secondary | ICD-10-CM | POA: Insufficient documentation

## 2019-02-02 DIAGNOSIS — I10 Essential (primary) hypertension: Secondary | ICD-10-CM | POA: Diagnosis not present

## 2019-02-02 DIAGNOSIS — K219 Gastro-esophageal reflux disease without esophagitis: Secondary | ICD-10-CM | POA: Diagnosis not present

## 2019-02-02 DIAGNOSIS — Z90722 Acquired absence of ovaries, bilateral: Secondary | ICD-10-CM | POA: Diagnosis not present

## 2019-02-02 DIAGNOSIS — Z90710 Acquired absence of both cervix and uterus: Secondary | ICD-10-CM | POA: Insufficient documentation

## 2019-02-02 DIAGNOSIS — Z791 Long term (current) use of non-steroidal anti-inflammatories (NSAID): Secondary | ICD-10-CM | POA: Diagnosis not present

## 2019-02-02 DIAGNOSIS — Z9221 Personal history of antineoplastic chemotherapy: Secondary | ICD-10-CM | POA: Diagnosis not present

## 2019-02-02 DIAGNOSIS — M1990 Unspecified osteoarthritis, unspecified site: Secondary | ICD-10-CM | POA: Insufficient documentation

## 2019-02-02 NOTE — Patient Instructions (Addendum)
Please notify Dr Denman George at phone number [phone] if you notice vaginal bleeding, new pelvic or abdominal pains, bloating, feeling full easy, or a change in bladder or bowel function.   Please contact Dr Serita Grit office (at [phone]) in October, 2020 to request an appointment with her for February, 2021. Set up  a lab appointment a for the week prior to your appointment in February.

## 2019-02-02 NOTE — Progress Notes (Signed)
Followup Note: Gyn-Onc   CC:  Chief Complaint  Patient presents with  . Ovarian Cancer    follow-up    Assessment/Plan:  [name]  is a 58 y.o.  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 No evidence of recurrence on exam today.   1/ left pelvic mass on CT imaging:This appears most consistent with a lymphocyst. It is smooth and regular in appearnce and cystic in appearance. It is stable/decreasing on followup imaging. No additional surveillance is necessary.  2/ Followup: with me in 6 months until June, 2021.  3/ She was counseled regarding symptoms of recurrence and to notify us sooner if these develop.  4/ CA 125 and CEA at next visit in 6 months.   5/ obesity - counseled about weight loss strategies of diet and muscle building exercise.   HPI: Alyssa [name] is a 58 year old G3 P3 who is seen in consultation at the request of Dr. Marin Roberts for a 26 cm pelvic mass and elevated CA-125. The patient began noting vague abdominal fullness and distention since December 2015. When she was evaluated by her local ER at [facility] a CT of the abdomen and pelvis was obtained on 07/28/2014. This revealed a large (26 a 12 cm) cystic mass with enhancing septations and heterogeneous cystic spaces and peripheral nodular soft tissue component. It appeared to be arising from the left ovary. It occupied the entire mid pelvis. There was deviation of the otherwise normal appearing uterus. There was no ascites lymphadenopathy perineal carcinomatosis or omental cake identified. She was seen by Dr. Armandina Stammer this further evaluated her with a CA-125 which was mildly elevated at 68. CEA was normal. Given the potential for underlying malignancy she was sent for my evaluation and consultation.  The patient is otherwise healthy  woman. She denies abdominal pain, early satiety, or weight loss. Her only prior abdominal surgery is a tubal ligation. She has also undergone endometrial ablation. The family history is not concerning for increased risk for malignancy (her mother had dual cancers of vulva and lung but was a heavy smoker). She is neck for performance status. She is premenopausal and continues to have regular menses with no intermenstrual bleeding.  She has a history of an ASCUS pap 3 years ago with negative hr HPV testing at that time.  She underwent TAH, BSO lymphadenectomy, omentectomy with Dr Kaleen Mask at [facility] on 08/11/14. 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 The primary tumor was 25 cm, no LDS I, 0 of 16 nodes involved , the tumor was intestinal type with expansile patent and associated with a mucinous borderline tumor of the ovary.  She went on to receive 6 cycles of adjuvant carboplatin and paclitaxel chemotherapy (from 08/31/14 to 12/14/14).  CA 125 at completion of therapy in June, 2016 was 13.   CT chest/abdo/pelvis on 01/17/15 : Status post total abdominal hysterectomy and bilateral salpingo-oophorectomy. Previously noted large pelvis mass has been resected. However, today's study does demonstrate a 6.0 x 4.7 x 7.1 cm well-defined low-attenuation lesion along the left pelvic sidewall (image 71 of series 2), suspicious for local recurrence of disease. No other definite cystic or solid pelvic mass is identified. No definite peritoneal nodularity.   03/03/15 Followup CT: showed reduction in size of size of left pelvic  lymphocyst.  CA 125 on 06/18/16 was stable at 10.  Interval History: she feels well with no complaints and no ongoing symptoms of chemotherapy. No symptoms from lymphocyst compression. S/p cholecystectomy in 2018. Her CA 125 had increased to 12 in November, 2018. It then decreased to 11 in December, 2018 and down to 10  in March, 2019.  Her CA 125 was normal at9.9 on 07/25/18 and again stable at 9.7 on 01/23/19. Her CEA remained stable at 1.05 on 01/23/19.   She reports increasing weight gain.   Current Meds:  Outpatient Encounter Medications as of 02/02/2019  Medication Sig  . amLODipine (NORVASC) 5 MG tablet TAKE 1 TABLET BY MOUTH  DAILY  . Diclofenac Sodium (PENNSAID) 2 % SOLN Place 2 g onto the skin 2 (two) times daily.  . Diclofenac Sodium 2 % SOLN Apply 1 pump twice daily as needed.  . diphenhydrAMINE (BENADRYL) 25 MG tablet Take 50 mg at bedtime by mouth.  . estradiol (VIVELLE-DOT) 0.025 MG/24HR APPLY 1 PATCH TWICE WEEKLY  . gabapentin (NEURONTIN) 100 MG capsule Take 2 capsules (200 mg total) by mouth at bedtime.  Marland Kitchen losartan (COZAAR) 50 MG tablet TAKE 1 TABLET BY MOUTH  DAILY  . meloxicam (MOBIC) 15 MG tablet Take 1 tablet by mouth once daily  . omeprazole (PRILOSEC) 20 MG capsule Take 1 capsule (20 mg total) by mouth daily. Needs visit for future refills.  Marland Kitchen tiZANidine (ZANAFLEX) 4 MG tablet Take 1 tablet (4 mg total) by mouth every 8 (eight) hours as needed for muscle spasms. (Patient not taking: Reported on 11/29/2018)   No facility-administered encounter medications on file as of 02/02/2019.     Allergy:  Allergies  Allergen Reactions  . Codeine Itching  . Doxycycline     Per pt, she gets chest pain from doxycycline  . Penicillins Nausea And Vomiting    Social Hx:   Social History   Socioeconomic History  . Marital status: Married    Spouse name: Not on file  . Number of children: 3  . Years of education: Not on file  . Highest education level: Not on file  Occupational History  . Not on file  Social Needs  . Financial resource strain: Not on file  . Food insecurity    Worry: Not on file    Inability: Not on file  . Transportation needs    Medical: Not on file    Non-medical: Not on file  Tobacco Use  . Smoking status: Never Smoker  . Smokeless tobacco: Never Used  Substance  and Sexual Activity  . Alcohol use: No  . Drug use: No  . Sexual activity: Yes    Birth control/protection: Post-menopausal    Comment: Hysterctomy  Lifestyle  . Physical activity    Days per week: Not on file    Minutes per session: Not on file  . Stress: Not on file  Relationships  . Social Herbalist on phone: Not on file    Gets together: Not on file    Attends religious service: Not on file    Active member of club or organization: Not on file    Attends meetings of clubs or organizations: Not on file    Relationship status: Not on file  . Intimate partner violence    Fear of current or ex partner: Not on file    Emotionally abused: Not on file    Physically abused: Not on file    Forced sexual  activity: Not on file  Other Topics Concern  . Not on file  Social History Narrative  . Not on file    Past Surgical Hx:  Past Surgical History:  Procedure Laterality Date  . ABDOMINAL HYSTERECTOMY    . APPENDECTOMY     with hysterectomy  . ENDOMETRIAL ABLATION  2002  . LAPAROSCOPIC CHOLECYSTECTOMY  12/09/2016  . TUBAL LIGATION  1991  . WISDOM TOOTH EXTRACTION      Past Medical Hx:  Past Medical History:  Diagnosis Date  . Arthritis    hands  . GERD (gastroesophageal reflux disease)   . Hypertension   . Ovarian cancer ([place]) 08/2014   mucinous ovarian cancer  . SVD (spontaneous vaginal delivery)    x 3    Past Gynecological History:  G3P3 (SVD's), tubal ligation. LMP in December 2015.  No LMP recorded. Patient has had a hysterectomy.  Family Hx:  Family History  Problem Relation Age of Onset  . Cancer Mother 25       vulvar cancer  . Lung cancer Mother 59  . Stroke Father   . Prostate cancer Maternal Uncle   . Stroke Maternal Grandmother   . Prostate cancer Maternal Grandfather   . Cervical cancer Paternal Grandmother   . Throat cancer Paternal Grandfather   . Prostate cancer Maternal Uncle   . Colon cancer Neg Hx   . Rectal cancer Neg Hx   .  Stomach cancer Neg Hx     Review of Systems:  Constitutional  Feels well,    ENT Normal appearing ears and nares bilaterally Skin/Breast  No rash, sores, jaundice, itching, dryness Cardiovascular  No chest pain, shortness of breath, or edema  Pulmonary  No cough or wheeze.  Gastro Intestinal  No nausea, vomitting, or diarrhoea. No bright red blood per rectum, no abdominal pain, change in bowel movement, or constipation.  Genito Urinary  No frequency, urgency, dysuria, see HPI Musculo Skeletal  No myalgia, arthralgia, joint swelling or pain  Neurologic  No weakness, numbness, change in gait,  Psychology  No depression, anxiety, insomnia.   Vitals:  Blood pressure 122/79, pulse 77, temperature 98.3 F (36.8 C), temperature source Oral, resp. rate 18, height 5\' 2"  (1.575 m), weight 209 lb 3.2 oz (94.9 kg), SpO2 100 %.  Physical Exam: WD in NAD Neck  Supple NROM, without any enlargements.  Lymph Node Survey No cervical supraclavicular or inguinal adenopathy Cardiovascular  Pulse normal rate, regularity and rhythm. S1 and S2 normal.  Lungs  Clear to auscultation bilateraly, without wheezes/crackles/rhonchi. Good air movement.  Skin  No rash/lesions/breakdown  Psychiatry  Alert and oriented to person, place, and time  Abdomen  Normoactive bowel sounds, abdomen soft, non-tender and mildly overweight without evidence of hernia. No palpable masses Back No CVA tenderness Genito Urinary  Vulva/vagina: Normal external female genitalia.  No lesions. No discharge or bleeding.  Bladder/urethra:  No lesions or masses, well supported bladder  Vagina: regular with no lesions  Cervix: surgically absent  Uterus: surgically absent  Adnexa:no longer able to palpate cystic mass in left pelvis Rectal  Good tone, no masses no cul de sac nodularity.  Extremities  No bilateral cyanosis, clubbing or edema.   Thereasa Solo, MD   02/02/2019, 3:49 PM

## 2019-02-11 ENCOUNTER — Other Ambulatory Visit: Payer: Self-pay | Admitting: Internal Medicine

## 2019-02-11 DIAGNOSIS — N838 Other noninflammatory disorders of ovary, fallopian tube and broad ligament: Secondary | ICD-10-CM

## 2019-02-18 ENCOUNTER — Other Ambulatory Visit: Payer: Self-pay | Admitting: Family Medicine

## 2019-02-20 ENCOUNTER — Other Ambulatory Visit: Payer: Self-pay | Admitting: Family Medicine

## 2019-02-27 ENCOUNTER — Other Ambulatory Visit: Payer: Self-pay | Admitting: Internal Medicine

## 2019-02-27 DIAGNOSIS — N838 Other noninflammatory disorders of ovary, fallopian tube and broad ligament: Secondary | ICD-10-CM

## 2019-03-04 ENCOUNTER — Other Ambulatory Visit: Payer: Self-pay | Admitting: Internal Medicine

## 2019-03-13 ENCOUNTER — Ambulatory Visit (INDEPENDENT_AMBULATORY_CARE_PROVIDER_SITE_OTHER)
Admission: RE | Admit: 2019-03-13 | Discharge: 2019-03-13 | Disposition: A | Discharge status: Home / Self Care | Payer: BC Managed Care – PPO | Source: Ambulatory Visit | Attending: Family Medicine | Admitting: Family Medicine

## 2019-03-13 ENCOUNTER — Ambulatory Visit (INDEPENDENT_AMBULATORY_CARE_PROVIDER_SITE_OTHER): Payer: BC Managed Care – PPO | Admitting: Family Medicine

## 2019-03-13 ENCOUNTER — Other Ambulatory Visit: Payer: Self-pay

## 2019-03-13 ENCOUNTER — Ambulatory Visit: Payer: Self-pay

## 2019-03-13 ENCOUNTER — Encounter: Payer: Self-pay | Admitting: Family Medicine

## 2019-03-13 VITALS — BP 126/62 | HR 109 | Ht 62.0 in

## 2019-03-13 DIAGNOSIS — M25551 Pain in right hip: Secondary | ICD-10-CM | POA: Diagnosis not present

## 2019-03-13 DIAGNOSIS — C562 Malignant neoplasm of left ovary: Secondary | ICD-10-CM | POA: Diagnosis not present

## 2019-03-13 DIAGNOSIS — M549 Dorsalgia, unspecified: Secondary | ICD-10-CM

## 2019-03-13 DIAGNOSIS — G8929 Other chronic pain: Secondary | ICD-10-CM

## 2019-03-13 DIAGNOSIS — M7061 Trochanteric bursitis, right hip: Secondary | ICD-10-CM

## 2019-03-13 DIAGNOSIS — M545 Low back pain: Secondary | ICD-10-CM | POA: Diagnosis not present

## 2019-03-13 NOTE — Assessment & Plan Note (Signed)
Patient given another injection again today.  Has tolerated it before.  Hopefully she will continue to improve.  Patient continues to have no difficulty I would like to consider an MRI of the lumbar spine with patient's history of ovarian cancer.  Patient is in agreement with the plan will follow-up again in 4 to 6 weeks.

## 2019-03-13 NOTE — Progress Notes (Signed)
[name] [address] [place], [place] [address] Phone: [phone] Subjective:   I Alyssa [name] am serving as a Education administrator for Dr. Hulan Saas.   CC: Back pain follow-up  [id number]  Alyssa [name] is a 58 y.o. female coming in with complaint of back pain. States she is doing ok today.  Patient has had back pain for some time.  Seems to be worsening.  More pain on the right hip as well.  Patient is tender to palpation.  And states that it is waking her up at night.  Patient feels like she is not making any significant improvement when she was doing previously.    Past Medical History:  Diagnosis Date  . Arthritis    hands  . GERD (gastroesophageal reflux disease)   . Hypertension   . 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 SVD (spontaneous vaginal delivery)    x 3   Past Surgical History:  Procedure Laterality Date  . ABDOMINAL HYSTERECTOMY    . APPENDECTOMY     with hysterectomy  . ENDOMETRIAL ABLATION  2002  . LAPAROSCOPIC CHOLECYSTECTOMY  12/09/2016  . TUBAL LIGATION  1991  . WISDOM TOOTH EXTRACTION     Social History   Socioeconomic History  . Marital status: Married    Spouse name: Not on file  . Number of children: 3  . Years of education: Not on file  . Highest education level: Not on file  Occupational History  . Not on file  Social Needs  . Financial resource strain: Not on file  . Food insecurity    Worry: Not on file    Inability: Not on file  . Transportation needs    Medical: Not on file    Non-medical: Not on file  Tobacco Use  . Smoking status: Never Smoker  . Smokeless tobacco: Never Used  Substance and Sexual Activity  . Alcohol use: No  . Drug use: No  . Sexual activity: Yes    Birth control/protection: Post-menopausal    Comment: Hysterctomy  Lifestyle  . Physical activity    Days per week: Not on file    Minutes per session: Not on file  . Stress: Not on file  Relationships   . Social Herbalist on phone: Not on file    Gets together: Not on file    Attends religious service: Not on file    Active member of club or organization: Not on file    Attends meetings of clubs or organizations: Not on file    Relationship status: Not on file  Other Topics Concern  . Not on file  Social History Narrative  . Not on file   Allergies  Allergen Reactions  . Codeine Itching  . Doxycycline     Per pt, she gets chest pain from doxycycline  . Penicillins Nausea And Vomiting   Family History  Problem Relation Age of Onset  . Cancer Mother 51       vulvar cancer  . Lung cancer Mother 85  . Stroke Father   . Prostate cancer Maternal Uncle   . Stroke Maternal Grandmother   . Prostate cancer Maternal Grandfather   . Cervical cancer Paternal Grandmother   . Throat cancer Paternal Grandfather   . Prostate cancer Maternal Uncle   . Colon cancer Neg Hx   . Rectal cancer Neg Hx   . Stomach cancer Neg Hx  Current Outpatient Medications (Endocrine & Metabolic):  .  estradiol (VIVELLE-DOT) 0.025 MG/24HR, APPLY 1 PATCH TWICE WEEKLY  Current Outpatient Medications (Cardiovascular):  .  amLODipine (NORVASC) 5 MG tablet, TAKE 1 TABLET BY MOUTH  DAILY .  losartan (COZAAR) 50 MG tablet, TAKE 1 TABLET BY MOUTH  DAILY  Current Outpatient Medications (Respiratory):  .  diphenhydrAMINE (BENADRYL) 25 MG tablet, Take 50 mg at bedtime by mouth.  Current Outpatient Medications (Analgesics):  .  meloxicam (MOBIC) 15 MG tablet, Take 1 tablet by mouth once daily   Current Outpatient Medications (Other):  Marland Kitchen  Diclofenac Sodium (PENNSAID) 2 % SOLN, Place 2 g onto the skin 2 (two) times daily. .  Diclofenac Sodium 2 % SOLN, Apply 1 pump twice daily as needed. .  gabapentin (NEURONTIN) 100 MG capsule, Take 2 capsules (200 mg total) by mouth at bedtime. Marland Kitchen  omeprazole (PRILOSEC) 20 MG capsule, Take 1 capsule (20 mg total) by mouth daily. Due for a follow up visit for more  refills. Marland Kitchen  tiZANidine (ZANAFLEX) 4 MG tablet, TAKE 1 TABLET BY MOUTH EVERY 8 HOURS AS NEEDED FOR MUSCLE SPASMS    Past medical history, social, surgical and family history all reviewed in electronic medical record.  No pertanent information unless stated regarding to the chief complaint.   Review of Systems:  No headache, visual changes, nausea, vomiting, diarrhea, constipation, dizziness, abdominal pain, skin rash, fevers, chills, night sweats, weight loss, swollen lymph nodes, body aches, joint swelling, muscle aches, chest pain, shortness of breath, mood changes.   Objective  Blood pressure 126/62, pulse (!) 109, height 5\' 2"  (1.575 m), SpO2 98 %.    General: No apparent distress alert and oriented x3 mood and affect normal, dressed appropriately.  HEENT: Pupils equal, extraocular movements intact  Respiratory: Patient's speak in full sentences and does not appear short of breath  Cardiovascular: No lower extremity edema, non tender, no erythema  Skin: Warm dry intact with no signs of infection or rash on extremities or on axial skeleton.  Abdomen: Soft nontender  Neuro: Cranial nerves II through XII are intact, neurovascularly intact in all extremities with 2+ DTRs and 2+ pulses.  Lymph: No lymphadenopathy of posterior or anterior cervical chain or axillae bilaterally.  Gait normal with good balance and coordination.  MSK:  tender with full range of motion and good stability and symmetric strength and tone of shoulders, elbows, wrist,, knee and ankles bilaterally.  Right hip exam shows the patient is still severely tender to palpation over the greater trochanteric area, patient has some tightness with straight leg test.  Deep tendon reflexes do appear to be intact.  Patient does have pain over the paraspinal musculature lumbar spine more L5-S1 greater than left.    Procedure: Real-time Ultrasound Guided Injection of right greater trochanteric bursitis secondary to patient's body  habitus Device: GE Logiq Q7 Ultrasound guided injection is preferred based studies that show increased duration, increased effect, greater accuracy, decreased procedural pain, increased response rate, and decreased cost with ultrasound guided versus blind injection.  Verbal informed consent obtained.  Time-out conducted.  Noted no overlying erythema, induration, or other signs of local infection.  Skin prepped in a sterile fashion.  Local anesthesia: Topical Ethyl chloride.  With sterile technique and under real time ultrasound guidance:  Greater trochanteric area was visualized and patient's bursa was noted. A 22-gauge 3 inch needle was inserted and 4 cc of 0.5% Marcaine and 1 cc of Kenalog 40 mg/dL was injected. Pictures taken Completed  without difficulty  Pain immediately resolved suggesting accurate placement of the medication.  Advised to call if fevers/chills, erythema, induration, drainage, or persistent bleeding.  Images permanently stored and available for review in the ultrasound unit.  Impression: Technically successful ultrasound guided injection.   Impression and Recommendations:     This case required medical decision making of moderate complexity. The above documentation has been reviewed and is accurate and complete Lyndal Pulley, DO       Note: This dictation was prepared with Dragon dictation along with smaller phrase technology. Any transcriptional errors that result from this process are unintentional.

## 2019-03-13 NOTE — Patient Instructions (Signed)
Good to see you Xray downstairs IF not better in 2 weeks call us and we will get an MRI

## 2019-03-15 ENCOUNTER — Other Ambulatory Visit: Payer: Self-pay | Admitting: Internal Medicine

## 2019-03-15 DIAGNOSIS — N838 Other noninflammatory disorders of ovary, fallopian tube and broad ligament: Secondary | ICD-10-CM

## 2019-03-16 ENCOUNTER — Other Ambulatory Visit: Payer: Self-pay | Admitting: Family Medicine

## 2019-03-18 ENCOUNTER — Other Ambulatory Visit: Payer: Self-pay | Admitting: Family Medicine

## 2019-03-19 ENCOUNTER — Encounter: Payer: Self-pay | Admitting: Family Medicine

## 2019-04-01 ENCOUNTER — Ambulatory Visit: Payer: BC Managed Care – PPO | Admitting: Internal Medicine

## 2019-04-02 NOTE — Progress Notes (Signed)
Subjective:    Patient ID: Alyssa [name], female    DOB: 1961/06/09, 58 y.o.   MRN: [id number]  HPI The patient is here for follow up.  She is exercising regularly - walking.    Hypertension: She is taking her medication daily. She is compliant with a low sodium diet.  She denies chest pain, edema, shortness of breath and regular headaches.     Prediabetes:  She is compliant with a low sugar/carbohydrate diet.  She is exercising regularly.  GERD:  She is taking her medication daily as prescribed.  She denies any GERD symptoms and feels her GERD is well controlled.       Medications and allergies reviewed with patient and updated if appropriate.  Patient Active Problem List   Diagnosis Date Noted  . 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 Greater trochanteric bursitis of right hip 04/28/2018  . Chronic low back pain 04/10/2018  . Left shoulder pain 04/10/2018  . Palpitations 10/11/2017  . 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 Midline low back pain without sciatica 10/11/2017  . Genetic testing 12/27/2016  . Prediabetes 10/04/2016  . Right hip pain 06/19/2016  . 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 Family history of prostate cancer 12/01/2015  . Esophageal reflux 09/15/2015  . Obesity (BMI 30-39.9) 06/03/2015  . Essential hypertension 11/11/2014  . Chemotherapy-induced peripheral neuropathy ([facility]) 10/18/2014  . Vasomotor instability 10/05/2014  . Carcinoma of left ovary ([facility]) 08/24/2014  . Surgical menopause 08/24/2014  . Ovarian cancer ([facility]) 08/02/2014    Current Outpatient Medications on File Prior to Visit  Medication Sig Dispense Refill  . amLODipine (NORVASC) 5 MG tablet TAKE 1 TABLET BY MOUTH  DAILY 90 tablet 0  . Diclofenac Sodium (PENNSAID) 2 % SOLN Place 2 g onto the skin 2 (two) times daily. 112 g 3  . Diclofenac Sodium 2 % SOLN Apply 1 pump twice daily as needed. 112 g 3  . diphenhydrAMINE (BENADRYL) 25 MG tablet Take 50 mg at  bedtime by mouth.    . estradiol (VIVELLE-DOT) 0.025 MG/24HR APPLY 1 PATCH TWICE WEEKLY 24 patch 11  . gabapentin (NEURONTIN) 100 MG capsule Take 2 capsules (200 mg total) by mouth at bedtime. 60 capsule 3  . losartan (COZAAR) 50 MG tablet TAKE 1 TABLET BY MOUTH  DAILY 90 tablet 0  . meloxicam (MOBIC) 15 MG tablet Take 1 tablet by mouth once daily 30 tablet 0  . omeprazole (PRILOSEC) 20 MG capsule TAKE 1 CAPSULE BY MOUTH  DAILY DUE FOR A FOLLOW UP  VISIT FOR MORE REFILLS 90 capsule 0  . tiZANidine (ZANAFLEX) 4 MG tablet TAKE 1 TABLET BY MOUTH EVERY 8 HOURS AS NEEDED FOR MUSCLE SPASMS 60 tablet 0   No current facility-administered medications on file prior to visit.     Past Medical History:  Diagnosis Date  . Arthritis    hands  . GERD (gastroesophageal reflux disease)   . Hypertension   . 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 SVD (spontaneous vaginal delivery)    x 3    Past Surgical History:  Procedure Laterality Date  . ABDOMINAL HYSTERECTOMY    . APPENDECTOMY     with hysterectomy  . ENDOMETRIAL ABLATION  2002  . LAPAROSCOPIC CHOLECYSTECTOMY  12/09/2016  . TUBAL LIGATION  1991  . WISDOM TOOTH EXTRACTION      Social History   Socioeconomic History  . Marital status: Married    Spouse  name: Not on file  . Number of children: 3  . Years of education: Not on file  . Highest education level: Not on file  Occupational History  . Not on file  Social Needs  . Financial resource strain: Not on file  . Food insecurity    Worry: Not on file    Inability: Not on file  . Transportation needs    Medical: Not on file    Non-medical: Not on file  Tobacco Use  . Smoking status: Never Smoker  . Smokeless tobacco: Never Used  Substance and Sexual Activity  . Alcohol use: No  . Drug use: No  . Sexual activity: Yes    Birth control/protection: Post-menopausal    Comment: Hysterctomy  Lifestyle  . Physical activity    Days per week: Not on file     Minutes per session: Not on file  . Stress: Not on file  Relationships  . Social Herbalist on phone: Not on file    Gets together: Not on file    Attends religious service: Not on file    Active member of club or organization: Not on file    Attends meetings of clubs or organizations: Not on file    Relationship status: Not on file  Other Topics Concern  . Not on file  Social History Narrative  . Not on file    Family History  Problem Relation Age of Onset  . Cancer Mother 91       vulvar cancer  . Lung cancer Mother 36  . Stroke Father   . Prostate cancer Maternal Uncle   . Stroke Maternal Grandmother   . Prostate cancer Maternal Grandfather   . Cervical cancer Paternal Grandmother   . Throat cancer Paternal Grandfather   . Prostate cancer Maternal Uncle   . Colon cancer Neg Hx   . Rectal cancer Neg Hx   . Stomach cancer Neg Hx     Review of Systems  Constitutional: Negative for chills and fever.  Respiratory: Negative for cough, shortness of breath and wheezing.   Cardiovascular: Positive for palpitations (with stress). Negative for chest pain and leg swelling.  Neurological: Negative for light-headedness and headaches.       Objective:   Vitals:   04/03/19 0938  BP: 130/82  Pulse: 83  Resp: 16  Temp: 98.5 F (36.9 C)  SpO2: 99%   BP Readings from Last 3 Encounters:  04/03/19 130/82  03/13/19 126/62  02/02/19 122/79   Wt Readings from Last 3 Encounters:  04/03/19 200 lb 12.8 oz (91.1 kg)  02/02/19 209 lb 3.2 oz (94.9 kg)  09/16/18 207 lb (93.9 kg)   Body mass index is 36.73 kg/m.   Physical Exam    Constitutional: Appears well-developed and well-nourished. No distress.  HENT:  Head: Normocephalic and atraumatic.  Neck: Neck supple. No tracheal deviation present. No thyromegaly present.  No cervical lymphadenopathy Cardiovascular: Normal rate, regular rhythm and normal heart sounds.   No murmur heard. No carotid bruit .  No edema  Pulmonary/Chest: Effort normal and breath sounds normal. No respiratory distress. No has no wheezes. No rales.  Skin: Skin is warm and dry. Not diaphoretic.  Psychiatric: Normal mood and affect. Behavior is normal.      Assessment & Plan:    See Problem List for Assessment and Plan of chronic medical problems.

## 2019-04-02 NOTE — Patient Instructions (Addendum)
  Tests ordered today. Your results will be released to MyChart (or called to you) after review.  If any changes need to be made, you will be notified at that same time.  Flu immunization administered today.    Medications reviewed and updated.  Changes include :  none      Please followup in 6 months   

## 2019-04-03 ENCOUNTER — Encounter: Payer: Self-pay | Admitting: Internal Medicine

## 2019-04-03 ENCOUNTER — Other Ambulatory Visit: Payer: Self-pay

## 2019-04-03 ENCOUNTER — Ambulatory Visit (INDEPENDENT_AMBULATORY_CARE_PROVIDER_SITE_OTHER): Payer: BC Managed Care – PPO | Admitting: Family Medicine

## 2019-04-03 ENCOUNTER — Ambulatory Visit: Payer: Self-pay

## 2019-04-03 ENCOUNTER — Other Ambulatory Visit (INDEPENDENT_AMBULATORY_CARE_PROVIDER_SITE_OTHER): Payer: BC Managed Care – PPO

## 2019-04-03 ENCOUNTER — Ambulatory Visit (INDEPENDENT_AMBULATORY_CARE_PROVIDER_SITE_OTHER): Payer: BC Managed Care – PPO | Admitting: Internal Medicine

## 2019-04-03 ENCOUNTER — Ambulatory Visit: Payer: BC Managed Care – PPO | Admitting: Family Medicine

## 2019-04-03 VITALS — BP 110/72 | Ht 62.0 in | Wt 209.0 lb

## 2019-04-03 VITALS — BP 130/82 | HR 83 | Temp 98.5°F | Resp 16 | Ht 62.0 in | Wt 200.8 lb

## 2019-04-03 DIAGNOSIS — R7303 Prediabetes: Secondary | ICD-10-CM

## 2019-04-03 DIAGNOSIS — I10 Essential (primary) hypertension: Secondary | ICD-10-CM

## 2019-04-03 DIAGNOSIS — M7601 Gluteal tendinitis, right hip: Secondary | ICD-10-CM

## 2019-04-03 DIAGNOSIS — M25551 Pain in right hip: Secondary | ICD-10-CM

## 2019-04-03 DIAGNOSIS — Z23 Encounter for immunization: Secondary | ICD-10-CM | POA: Diagnosis not present

## 2019-04-03 DIAGNOSIS — K219 Gastro-esophageal reflux disease without esophagitis: Secondary | ICD-10-CM

## 2019-04-03 LAB — COMPREHENSIVE METABOLIC PANEL
ALT: 23 U/L (ref 0–35)
AST: 19 U/L (ref 0–37)
Albumin: 4.4 g/dL (ref 3.5–5.2)
Alkaline Phosphatase: 68 U/L (ref 39–117)
BUN: 26 mg/dL — ABNORMAL HIGH (ref 6–23)
CO2: 28 mEq/L (ref 19–32)
Calcium: 9.8 mg/dL (ref 8.4–10.5)
Chloride: 102 mEq/L (ref 96–112)
Creatinine, Ser: 1.09 mg/dL (ref 0.40–1.20)
GFR: 51.82 mL/min — ABNORMAL LOW (ref >60.00)
Glucose, Bld: 90 mg/dL (ref 70–99)
Potassium: 4 mEq/L (ref 3.5–5.1)
Sodium: 138 mEq/L (ref 135–145)
Total Bilirubin: 0.5 mg/dL (ref 0.2–1.2)
Total Protein: 7.5 g/dL (ref 6.0–8.3)

## 2019-04-03 LAB — CBC WITH DIFFERENTIAL/PLATELET
Basophils Absolute: 0 10*3/uL (ref 0.0–0.1)
Basophils Relative: 1.2 % (ref 0.0–3.0)
Eosinophils Absolute: 0.1 10*3/uL (ref 0.0–0.7)
Eosinophils Relative: 2.5 % (ref 0.0–5.0)
HCT: 45.6 % (ref 36.0–46.0)
Hemoglobin: 15.2 g/dL — ABNORMAL HIGH (ref 12.0–15.0)
Lymphocytes Relative: 22.9 % (ref 12.0–46.0)
Lymphs Abs: 0.9 10*3/uL (ref 0.7–4.0)
MCHC: 33.4 g/dL (ref 30.0–36.0)
MCV: 91.8 fl (ref 78.0–100.0)
Monocytes Absolute: 0.3 10*3/uL (ref 0.1–1.0)
Monocytes Relative: 7.3 % (ref 3.0–12.0)
Neutro Abs: 2.6 10*3/uL (ref 1.4–7.7)
Neutrophils Relative %: 66.1 % (ref 43.0–77.0)
Platelets: 221 10*3/uL (ref 150.0–400.0)
RBC: 4.97 Mil/uL (ref 3.87–5.11)
RDW: 13.7 % (ref 11.5–15.5)
WBC: 3.9 10*3/uL — ABNORMAL LOW (ref 4.0–10.5)

## 2019-04-03 LAB — HEMOGLOBIN A1C: Hgb A1c MFr Bld: 5.9 % (ref 4.6–6.5)

## 2019-04-03 NOTE — Patient Instructions (Addendum)
Injected glute today See me again in 6 weeks if not better in 2 send message and will get MRI

## 2019-04-03 NOTE — Assessment & Plan Note (Signed)
Check a1c Low sugar / carb diet Stressed regular exercise   

## 2019-04-03 NOTE — Assessment & Plan Note (Signed)
Gluteal tendinitis.  Discussed with patient that this injection if this does not seem to be beneficial we need to consider the possibility of advanced imaging in the MRI secondary to patient's history of the ovarian cancer.  Patient states not having any back pain.  Feels that she made significant more progress with this last injection.  Encouraged her to start exercising again regularly in the next 48 to 72 hours.  Follow-up again 6 weeks otherwise in the office.

## 2019-04-03 NOTE — Progress Notes (Addendum)
[name] [address], [place] [address] Phone: [phone] Subjective:   [name], am serving as a scribe for Dr. Hulan Saas.    CC: hip pain follow-up  [id number]   03/13/2019 Patient given another injection again today.  Has tolerated it before.  Hopefully she will continue to improve.  Patient continues to have no difficulty I would like to consider an MRI of the lumbar spine with patient's history of ovarian cancer.  Patient is in agreement with the plan will follow-up again in 4 to 6 weeks.  Update 04/03/2019 Alyssa [name] is a 58 y.o. female coming in with complaint of right hip pain. Patient states that after injection she had pain for 3 days but then felt relief. Back pain has improved.      Past Medical History:  Diagnosis Date  . Arthritis    hands  . GERD (gastroesophageal reflux disease)   . Hypertension   . 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 SVD (spontaneous vaginal delivery)    x 3   Past Surgical History:  Procedure Laterality Date  . ABDOMINAL HYSTERECTOMY    . APPENDECTOMY     with hysterectomy  . ENDOMETRIAL ABLATION  2002  . LAPAROSCOPIC CHOLECYSTECTOMY  12/09/2016  . TUBAL LIGATION  1991  . WISDOM TOOTH EXTRACTION     Social History   Socioeconomic History  . Marital status: Married    Spouse name: Not on file  . Number of children: 3  . Years of education: Not on file  . Highest education level: Not on file  Occupational History  . Not on file  Social Needs  . Financial resource strain: Not on file  . Food insecurity    Worry: Not on file    Inability: Not on file  . Transportation needs    Medical: Not on file    Non-medical: Not on file  Tobacco Use  . Smoking status: Never Smoker  . Smokeless tobacco: Never Used  Substance and Sexual Activity  . Alcohol use: No  . Drug use: No  . Sexual activity: Yes    Birth control/protection: Post-menopausal   Comment: Hysterctomy  Lifestyle  . Physical activity    Days per week: Not on file    Minutes per session: Not on file  . Stress: Not on file  Relationships  . Social Herbalist on phone: Not on file    Gets together: Not on file    Attends religious service: Not on file    Active member of club or organization: Not on file    Attends meetings of clubs or organizations: Not on file    Relationship status: Not on file  Other Topics Concern  . Not on file  Social History Narrative  . Not on file   Allergies  Allergen Reactions  . Codeine Itching  . Doxycycline     Per pt, she gets chest pain from doxycycline  . Penicillins Nausea And Vomiting   Family History  Problem Relation Age of Onset  . Cancer Mother 40       vulvar cancer  . Lung cancer Mother 38  . Stroke Father   . Prostate cancer Maternal Uncle   . Stroke Maternal Grandmother   . Prostate cancer Maternal Grandfather   . Cervical cancer Paternal Grandmother   . Throat cancer Paternal Grandfather   . Prostate cancer Maternal Uncle   .  Colon cancer Neg Hx   . Rectal cancer Neg Hx   . Stomach cancer Neg Hx     Current Outpatient Medications (Endocrine & Metabolic):  .  estradiol (VIVELLE-DOT) 0.025 MG/24HR, APPLY 1 PATCH TWICE WEEKLY  Current Outpatient Medications (Cardiovascular):  .  amLODipine (NORVASC) 5 MG tablet, TAKE 1 TABLET BY MOUTH  DAILY .  losartan (COZAAR) 50 MG tablet, TAKE 1 TABLET BY MOUTH  DAILY  Current Outpatient Medications (Respiratory):  .  diphenhydrAMINE (BENADRYL) 25 MG tablet, Take 50 mg at bedtime by mouth.  Current Outpatient Medications (Analgesics):  .  meloxicam (MOBIC) 15 MG tablet, Take 1 tablet by mouth once daily   Current Outpatient Medications (Other):  Marland Kitchen  Diclofenac Sodium (PENNSAID) 2 % SOLN, Place 2 g onto the skin 2 (two) times daily. .  Diclofenac Sodium 2 % SOLN, Apply 1 pump twice daily as needed. .  gabapentin (NEURONTIN) 100 MG capsule, Take 2  capsules (200 mg total) by mouth at bedtime. Marland Kitchen  omeprazole (PRILOSEC) 20 MG capsule, TAKE 1 CAPSULE BY MOUTH  DAILY DUE FOR A FOLLOW UP  VISIT FOR MORE REFILLS .  tiZANidine (ZANAFLEX) 4 MG tablet, TAKE 1 TABLET BY MOUTH EVERY 8 HOURS AS NEEDED FOR MUSCLE SPASMS    Past medical history, social, surgical and family history all reviewed in electronic medical record.  No pertanent information unless stated regarding to the chief complaint.   Review of Systems:  No headache, visual changes, nausea, vomiting, diarrhea, constipation, dizziness, abdominal pain, skin rash, fevers, chills, night sweats, weight loss, swollen lymph nodes, body aches, joint swelling, , chest pain, shortness of breath, mood changes.  Positive muscle aches  Objective  Blood pressure 110/72, height 5\' 2"  (1.575 m), weight 209 lb (94.8 kg).    General: No apparent distress alert and oriented x3 mood and affect normal, dressed appropriately.  HEENT: Pupils equal, extraocular movements intact  Respiratory: Patient's speak in full sentences and does not appear short of breath  Cardiovascular: No lower extremity edema, non tender, no erythema  Skin: Warm dry intact with no signs of infection or rash on extremities or on axial skeleton.  Abdomen: Soft nontender  Neuro: Cranial nerves II through XII are intact, neurovascularly intact in all extremities with 2+ DTRs and 2+ pulses.  Lymph: No lymphadenopathy of posterior or anterior cervical chain or axillae bilaterally.  Gait normal with good balance and coordination.  MSK:  Non tender with full range of motion and good stability and symmetric strength and tone of shoulders, elbows, wrist,, knee and ankles bilaterally.  Right hip exam shows the patient actually has improvement in range of motion but still with tenderness but now seems to be more of the gluteal tendon than truly over the greater trochanteric area.  No groin pain.  Negative straight leg test.  Mildly tender to  palpation in the paraspinal musculature of the lumbar spine  Procedure: Real-time Ultrasound Guided Injection of right gluteal tendon sheath Device: GE Logiq Q7 Ultrasound guided injection is preferred based studies that show increased duration, increased effect, greater accuracy, decreased procedural pain, increased response rate, and decreased cost with ultrasound guided versus blind injection.  Verbal informed consent obtained.  Time-out conducted.  Noted no overlying erythema, induration, or other signs of local infection.  Skin prepped in a sterile fashion.  Local anesthesia: Topical Ethyl chloride.  With sterile technique and under real time ultrasound guidance: With a 21-gauge 2 inch needle injected with 0.5 cc of 0.5% Marcaine and  0.5 cc of Kenalog 40 mg/mL Completed without difficulty  Pain immediately resolved suggesting accurate placement of the medication.  Advised to call if fevers/chills, erythema, induration, drainage, or persistent bleeding.  Images permanently stored and available for review in the ultrasound unit.  Impression: Technically successful ultrasound guided injection.    Impression and Recommendations:     This case required medical decision making of moderate complexity. The above documentation has been reviewed and is accurate and complete Lyndal Pulley, DO       Note: This dictation was prepared with Dragon dictation along with smaller phrase technology. Any transcriptional errors that result from this process are unintentional.

## 2019-04-03 NOTE — Assessment & Plan Note (Signed)
BP well controlled Current regimen effective and well tolerated Continue current medications at current doses Cmp, cbc 

## 2019-04-03 NOTE — Assessment & Plan Note (Signed)
GERD controlled Continue daily medication  

## 2019-04-04 ENCOUNTER — Encounter: Payer: Self-pay | Admitting: Internal Medicine

## 2019-04-04 ENCOUNTER — Encounter: Payer: Self-pay | Admitting: Family Medicine

## 2019-04-08 ENCOUNTER — Encounter: Payer: Self-pay | Admitting: Family Medicine

## 2019-04-14 ENCOUNTER — Other Ambulatory Visit: Payer: Self-pay | Admitting: Family Medicine

## 2019-04-22 ENCOUNTER — Other Ambulatory Visit: Payer: Self-pay | Admitting: Family Medicine

## 2019-05-05 ENCOUNTER — Ambulatory Visit: Payer: BC Managed Care – PPO | Admitting: Family Medicine

## 2019-05-08 ENCOUNTER — Other Ambulatory Visit: Payer: Self-pay

## 2019-05-08 ENCOUNTER — Ambulatory Visit (INDEPENDENT_AMBULATORY_CARE_PROVIDER_SITE_OTHER): Payer: BC Managed Care – PPO | Admitting: Family Medicine

## 2019-05-08 ENCOUNTER — Encounter: Payer: Self-pay | Admitting: Family Medicine

## 2019-05-08 DIAGNOSIS — M7601 Gluteal tendinitis, right hip: Secondary | ICD-10-CM | POA: Diagnosis not present

## 2019-05-08 NOTE — Patient Instructions (Signed)
Good to see you  You are doing amazing  Stay active  Exercises 2 times a week  See me again when you need me  [phone]

## 2019-05-08 NOTE — Assessment & Plan Note (Signed)
Significant improvement at this time.  Discussed which activities to do which wants to avoid.  Discussed icing regimen, patient is to increase activity slowly over the course the next several weeks.  Follow-up with me again in 4 to 8 weeks if not perfect otherwise see me as needed.

## 2019-05-08 NOTE — Progress Notes (Signed)
[name] [place], [place] [address] Phone: [phone] Subjective:   I Alyssa [name] am serving as a Education administrator for Dr. Hulan Saas.  I'm seeing this patient by the request  of:    CC: Low back pain follow-up  [id number]   04/03/2019 Gluteal tendinitis.  Discussed with patient that this injection if this does not seem to be beneficial we need to consider the possibility of advanced imaging in the MRI secondary to patient's history of the ovarian cancer.  Patient states not having any back pain.  Feels that she made significant more progress with this last injection.  Encouraged her to start exercising again regularly in the next 48 to 72 hours.  Follow-up again 6 weeks otherwise in the office.  05/08/2019 Alyssa [name] is a 58 y.o. female coming in with complaint of right hip pain. Patient states she is doing well.  Patient was seen previously for more than right hip pain.  Found to have more of a gluteal tendinitis.  States that she is feeling 95% better at this time.  Patient is to increase activity slowly over the course of next several weeks.  Discussed icing regimen which she has been doing really regularly as well.     Past Medical History:  Diagnosis Date  . Arthritis    hands  . GERD (gastroesophageal reflux disease)   . Hypertension   . 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 SVD (spontaneous vaginal delivery)    x 3   Past Surgical History:  Procedure Laterality Date  . ABDOMINAL HYSTERECTOMY    . APPENDECTOMY     with hysterectomy  . ENDOMETRIAL ABLATION  2002  . LAPAROSCOPIC CHOLECYSTECTOMY  12/09/2016  . TUBAL LIGATION  1991  . WISDOM TOOTH EXTRACTION     Social History   Socioeconomic History  . Marital status: Married    Spouse name: Not on file  . Number of children: 3  . Years of education: Not on file  . Highest education level: Not on file  Occupational History  . Not on file  Social  Needs  . Financial resource strain: Not on file  . Food insecurity    Worry: Not on file    Inability: Not on file  . Transportation needs    Medical: Not on file    Non-medical: Not on file  Tobacco Use  . Smoking status: Never Smoker  . Smokeless tobacco: Never Used  Substance and Sexual Activity  . Alcohol use: No  . Drug use: No  . Sexual activity: Yes    Birth control/protection: Post-menopausal    Comment: Hysterctomy  Lifestyle  . Physical activity    Days per week: Not on file    Minutes per session: Not on file  . Stress: Not on file  Relationships  . Social Herbalist on phone: Not on file    Gets together: Not on file    Attends religious service: Not on file    Active member of club or organization: Not on file    Attends meetings of clubs or organizations: Not on file    Relationship status: Not on file  Other Topics Concern  . Not on file  Social History Narrative  . Not on file   Allergies  Allergen Reactions  . Codeine Itching  . Doxycycline     Per pt, she gets chest pain from doxycycline  . Penicillins  Nausea And Vomiting   Family History  Problem Relation Age of Onset  . Cancer Mother 74       vulvar cancer  . Lung cancer Mother 75  . Stroke Father   . Prostate cancer Maternal Uncle   . Stroke Maternal Grandmother   . Prostate cancer Maternal Grandfather   . Cervical cancer Paternal Grandmother   . Throat cancer Paternal Grandfather   . Prostate cancer Maternal Uncle   . Colon cancer Neg Hx   . Rectal cancer Neg Hx   . Stomach cancer Neg Hx     Current Outpatient Medications (Endocrine & Metabolic):  .  estradiol (VIVELLE-DOT) 0.025 MG/24HR, APPLY 1 PATCH TWICE WEEKLY  Current Outpatient Medications (Cardiovascular):  .  amLODipine (NORVASC) 5 MG tablet, TAKE 1 TABLET BY MOUTH  DAILY .  losartan (COZAAR) 50 MG tablet, TAKE 1 TABLET BY MOUTH  DAILY  Current Outpatient Medications (Respiratory):  .  diphenhydrAMINE  (BENADRYL) 25 MG tablet, Take 50 mg at bedtime by mouth.  Current Outpatient Medications (Analgesics):  .  meloxicam (MOBIC) 15 MG tablet, Take 1 tablet by mouth once daily   Current Outpatient Medications (Other):  Marland Kitchen  Diclofenac Sodium (PENNSAID) 2 % SOLN, Place 2 g onto the skin 2 (two) times daily. .  Diclofenac Sodium 2 % SOLN, Apply 1 pump twice daily as needed. .  gabapentin (NEURONTIN) 100 MG capsule, TAKE 2 CAPSULES BY MOUTH AT BEDTIME .  omeprazole (PRILOSEC) 20 MG capsule, TAKE 1 CAPSULE BY MOUTH  DAILY DUE FOR A FOLLOW UP  VISIT FOR MORE REFILLS .  tiZANidine (ZANAFLEX) 4 MG tablet, TAKE 1 TABLET BY MOUTH EVERY 8 HOURS AS NEEDED FOR MUSCLE SPASM    Past medical history, social, surgical and family history all reviewed in electronic medical record.  No pertanent information unless stated regarding to the chief complaint.   Review of Systems:  No headache, visual changes, nausea, vomiting, diarrhea, constipation, dizziness, abdominal pain, skin rash, fevers, chills, night sweats, weight loss, swollen lymph nodes, body aches, joint swelling, chest pain, shortness of breath, mood changes.  Positive muscle aches  Objective  Blood pressure 120/90, pulse 69, height 5\' 2"  (1.575 m), weight 199 lb (90.3 kg), SpO2 93 %.    General: No apparent distress alert and oriented x3 mood and affect normal, dressed appropriately.  HEENT: Pupils equal, extraocular movements intact  Respiratory: Patient's speak in full sentences and does not appear short of breath  Cardiovascular: No lower extremity edema, non tender, no erythema  Skin: Warm dry intact with no signs of infection or rash on extremities or on axial skeleton.  Abdomen: Soft nontender  Neuro: Cranial nerves II through XII are intact, neurovascularly intact in all extremities with 2+ DTRs and 2+ pulses.  Lymph: No lymphadenopathy of posterior or anterior cervical chain or axillae bilaterally.  Gait normal with good balance and  coordination.  MSK:  Non tender with full range of motion and good stability and symmetric strength and tone of shoulders, elbows, wrist, , knee and ankles bilaterally.  Right hip mild tenderness with popliteal occlusion.  Negative straight leg test.    Impression and Recommendations:     The above documentation has been reviewed and is accurate and complete Lyndal Pulley, DO       Note: This dictation was prepared with Dragon dictation along with smaller phrase technology. Any transcriptional errors that result from this process are unintentional.

## 2019-05-16 ENCOUNTER — Other Ambulatory Visit: Payer: Self-pay | Admitting: Family Medicine

## 2019-05-19 ENCOUNTER — Other Ambulatory Visit: Payer: Self-pay | Admitting: Internal Medicine

## 2019-05-19 DIAGNOSIS — N838 Other noninflammatory disorders of ovary, fallopian tube and broad ligament: Secondary | ICD-10-CM

## 2019-05-24 ENCOUNTER — Other Ambulatory Visit: Payer: Self-pay | Admitting: Internal Medicine

## 2019-05-26 ENCOUNTER — Other Ambulatory Visit: Payer: Self-pay | Admitting: Family Medicine

## 2019-06-04 ENCOUNTER — Other Ambulatory Visit: Payer: Self-pay | Admitting: Family Medicine

## 2019-06-05 ENCOUNTER — Encounter: Payer: Self-pay | Admitting: Internal Medicine

## 2019-06-16 ENCOUNTER — Other Ambulatory Visit: Payer: Self-pay | Admitting: Family Medicine

## 2019-06-16 ENCOUNTER — Other Ambulatory Visit: Payer: Self-pay | Admitting: Gynecologic Oncology

## 2019-06-16 DIAGNOSIS — E8940 Asymptomatic postprocedural ovarian failure: Secondary | ICD-10-CM

## 2019-06-16 DIAGNOSIS — N9489 Other specified conditions associated with female genital organs and menstrual cycle: Secondary | ICD-10-CM

## 2019-06-16 DIAGNOSIS — R55 Syncope and collapse: Secondary | ICD-10-CM

## 2019-06-19 ENCOUNTER — Other Ambulatory Visit: Payer: Self-pay | Admitting: Family Medicine

## 2019-07-12 ENCOUNTER — Other Ambulatory Visit: Payer: Self-pay | Admitting: Family Medicine

## 2019-07-24 ENCOUNTER — Other Ambulatory Visit: Payer: Self-pay | Admitting: Family Medicine

## 2019-08-05 ENCOUNTER — Other Ambulatory Visit: Payer: Self-pay | Admitting: Family Medicine

## 2019-08-11 ENCOUNTER — Telehealth: Payer: Self-pay | Admitting: Family Medicine

## 2019-08-11 NOTE — Telephone Encounter (Signed)
Patient called stating that her "hip is out" and she is having continued back pain. She asked if she could be seen on Thursday. I told her that Dr Tamala Julian does not have any openings and offered for her to see Dr Georgina Snell. She would prefer to see Dr Tamala Julian and asked if she could be worked in sooner than the first available. Please advise.  I have her on a cancellation list as well.

## 2019-08-11 NOTE — Telephone Encounter (Signed)
Left message to see if patient would like to come on 08/12/2019 at 1:00pm. Otherwise will offer patient Dr. Georgina Snell.

## 2019-08-12 ENCOUNTER — Other Ambulatory Visit (INDEPENDENT_AMBULATORY_CARE_PROVIDER_SITE_OTHER): Payer: BC Managed Care – PPO

## 2019-08-12 ENCOUNTER — Encounter: Payer: Self-pay | Admitting: Family Medicine

## 2019-08-12 ENCOUNTER — Ambulatory Visit (INDEPENDENT_AMBULATORY_CARE_PROVIDER_SITE_OTHER): Payer: BC Managed Care – PPO | Admitting: Family Medicine

## 2019-08-12 ENCOUNTER — Other Ambulatory Visit: Payer: Self-pay

## 2019-08-12 VITALS — BP 110/66 | HR 66 | Ht 62.0 in | Wt 199.0 lb

## 2019-08-12 DIAGNOSIS — M7601 Gluteal tendinitis, right hip: Secondary | ICD-10-CM

## 2019-08-12 DIAGNOSIS — M2550 Pain in unspecified joint: Secondary | ICD-10-CM

## 2019-08-12 LAB — COMPREHENSIVE METABOLIC PANEL
ALT: 25 U/L (ref 0–35)
AST: 27 U/L (ref 0–37)
Albumin: 4.5 g/dL (ref 3.5–5.2)
Alkaline Phosphatase: 72 U/L (ref 39–117)
BUN: 15 mg/dL (ref 6–23)
CO2: 29 mEq/L (ref 19–32)
Calcium: 9.7 mg/dL (ref 8.4–10.5)
Chloride: 101 mEq/L (ref 96–112)
Creatinine, Ser: 1.04 mg/dL (ref 0.40–1.20)
GFR: 54.64 mL/min — ABNORMAL LOW (ref >60.00)
Glucose, Bld: 88 mg/dL (ref 70–99)
Potassium: 3.8 mEq/L (ref 3.5–5.1)
Sodium: 137 mEq/L (ref 135–145)
Total Bilirubin: 0.4 mg/dL (ref 0.2–1.2)
Total Protein: 7.6 g/dL (ref 6.0–8.3)

## 2019-08-12 LAB — TSH: TSH: 1.23 u[IU]/mL (ref 0.35–4.50)

## 2019-08-12 LAB — CBC WITH DIFFERENTIAL/PLATELET
Basophils Absolute: 0 10*3/uL (ref 0.0–0.1)
Basophils Relative: 1.2 % (ref 0.0–3.0)
Eosinophils Absolute: 0.1 10*3/uL (ref 0.0–0.7)
Eosinophils Relative: 2.2 % (ref 0.0–5.0)
HCT: 44.2 % (ref 36.0–46.0)
Hemoglobin: 14.4 g/dL (ref 12.0–15.0)
Lymphocytes Relative: 28 % (ref 12.0–46.0)
Lymphs Abs: 1.1 10*3/uL (ref 0.7–4.0)
MCHC: 32.7 g/dL (ref 30.0–36.0)
MCV: 93.4 fl (ref 78.0–100.0)
Monocytes Absolute: 0.2 10*3/uL (ref 0.1–1.0)
Monocytes Relative: 5.4 % (ref 3.0–12.0)
Neutro Abs: 2.4 10*3/uL (ref 1.4–7.7)
Neutrophils Relative %: 63.2 % (ref 43.0–77.0)
Platelets: 243 10*3/uL (ref 150.0–400.0)
RBC: 4.73 Mil/uL (ref 3.87–5.11)
RDW: 13.3 % (ref 11.5–15.5)
WBC: 3.8 10*3/uL — ABNORMAL LOW (ref 4.0–10.5)

## 2019-08-12 LAB — VITAMIN D 25 HYDROXY (VIT D DEFICIENCY, FRACTURES): VITD: 16.23 ng/mL — ABNORMAL LOW (ref 30.00–100.00)

## 2019-08-12 LAB — IBC PANEL
Iron: 127 ug/dL (ref 42–145)
Saturation Ratios: 34.6 % (ref 20.0–50.0)
Transferrin: 262 mg/dL (ref 212.0–360.0)

## 2019-08-12 LAB — SEDIMENTATION RATE: Sed Rate: 18 mm/hr (ref 0–30)

## 2019-08-12 LAB — URIC ACID: Uric Acid, Serum: 5.8 mg/dL (ref 2.4–7.0)

## 2019-08-12 LAB — FERRITIN: Ferritin: 38.8 ng/mL (ref 10.0–291.0)

## 2019-08-12 LAB — C-REACTIVE PROTEIN: CRP: <1 mg/dL (ref 0.5–20.0)

## 2019-08-12 NOTE — Progress Notes (Signed)
[name] Phone: [phone] Subjective:   Alyssa [name], am serving as a scribe for Dr. Hulan Saas. This visit occurred during the SARS-CoV-2 public health emergency.  Safety protocols were in place, including screening questions prior to the visit, additional usage of staff PPE, and extensive cleaning of exam room while observing appropriate contact time as indicated for disinfecting solutions. '  I'm seeing this patient by the request  of:  Burns, Stacy J, MD  CC: Low back pain follow-up  [id number]   05/08/2019 Significant improvement at this time.  Discussed which activities to do which wants to avoid.  Discussed icing regimen, patient is to increase activity slowly over the course the next several weeks.  Follow-up with me again in 4 to 8 weeks if not perfect otherwise see me as needed.  Update 08/12/2019 Alyssa [name] is a 59 y.o. female coming in with complaint of right hip pain over the glute. Has been working 6 days a week since Christmas. Two weeks ago her pain increased. Pain radiates down into the back of knee and intermittently into the arch of right foot. Continues to use gabapentin at night and during the day and meloxicam.    Right hand 2nd and 3rd DIP swelling and pain and 2nd finger on left hand. Is unable to flex joint near end of her day.  Having more aches and pains in multiple different joints at the moment.     Past Medical History:  Diagnosis Date  . Arthritis    hands  . GERD (gastroesophageal reflux disease)   . Hypertension   . 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 SVD (spontaneous vaginal delivery)    x 3   Past Surgical History:  Procedure Laterality Date  . ABDOMINAL HYSTERECTOMY    . APPENDECTOMY     with hysterectomy  . ENDOMETRIAL ABLATION  2002  . LAPAROSCOPIC CHOLECYSTECTOMY  12/09/2016  . TUBAL LIGATION  1991  . WISDOM TOOTH EXTRACTION     Social History   Socioeconomic History  . Marital status: Married    Spouse name: Not on file  . Number of children: 3  . Years of education: Not on file  . Highest education level: Not on file  Occupational History  . Not on file  Tobacco Use  . Smoking status: Never Smoker  . Smokeless tobacco: Never Used  Substance and Sexual Activity  . Alcohol use: [name]  . Drug use: [name]  . Sexual activity: Yes    Birth control/protection: Post-menopausal    Comment: Hysterctomy  Other Topics Concern  . Not on file  Social History Narrative  . Not on file   Social Determinants of Health   Financial Resource Strain:   . Difficulty of Paying Living Expenses: Not on file  Food Insecurity:   . Worried About Charity fundraiser in the Last Year: Not on file  . Ran Out of Food in the Last Year: Not on file  Transportation Needs:   . Lack of Transportation (Medical): Not on file  . Lack of Transportation (Non-Medical): Not on file  Physical Activity:   . Days of Exercise per Week: Not on file  . Minutes of Exercise per Session: Not on file  Stress:   . Feeling of Stress : Not on file  Social Connections:   . Frequency of Communication with Friends and Family: Not on file  . Frequency  of Social Gatherings with Friends and Family: Not on file  . Attends Religious Services: Not on file  . Active Member of Clubs or Organizations: Not on file  . Attends Archivist Meetings: Not on file  . Marital Status: Not on file   Allergies  Allergen Reactions  . Codeine Itching  . Doxycycline     Per pt, she gets chest pain from doxycycline  . Penicillins Nausea And Vomiting   Family History  Problem Relation Age of Onset  . Cancer Mother 72       vulvar cancer  . Lung cancer Mother 72  . Stroke Father   . Prostate cancer Maternal Uncle   . Stroke Maternal Grandmother   . Prostate cancer Maternal Grandfather   . Cervical cancer Paternal Grandmother   . Throat cancer Paternal  Grandfather   . Prostate cancer Maternal Uncle   . Colon cancer Neg Hx   . Rectal cancer Neg Hx   . Stomach cancer Neg Hx     Current Outpatient Medications (Endocrine & Metabolic):  Marland Kitchen  DOTTI 0.025 MG/24HR, APPLY 1 PATCH TOPICALLY TO  SKIN TWICE WEEKLY  Current Outpatient Medications (Cardiovascular):  .  amLODipine (NORVASC) 5 MG tablet, TAKE 1 TABLET BY MOUTH  DAILY .  losartan (COZAAR) 50 MG tablet, TAKE 1 TABLET BY MOUTH  DAILY  Current Outpatient Medications (Respiratory):  .  diphenhydrAMINE (BENADRYL) 25 MG tablet, Take 50 mg at bedtime by mouth.  Current Outpatient Medications (Analgesics):  .  meloxicam (MOBIC) 15 MG tablet, Take 1 tablet by mouth once daily   Current Outpatient Medications (Other):  Marland Kitchen  Diclofenac Sodium (PENNSAID) 2 % SOLN, Place 2 g onto the skin 2 (two) times daily. .  Diclofenac Sodium 2 % SOLN, Apply 1 pump twice daily as needed. .  gabapentin (NEURONTIN) 100 MG capsule, TAKE 2 CAPSULES BY MOUTH AT BEDTIME .  omeprazole (PRILOSEC) 20 MG capsule, TAKE 1 CAPSULE BY MOUTH  DAILY .  tiZANidine (ZANAFLEX) 4 MG tablet, TAKE 1 TABLET BY MOUTH EVERY 8 HOURS AS NEEDED FOR MUSCLE SPASM   Reviewed prior external information including notes and imaging from  primary care provider As well as notes that were available from care everywhere and other healthcare systems.  Past medical history, social, surgical and family history all reviewed in electronic medical record.  [name] pertanent information unless stated regarding to the chief complaint.   Review of Systems:  [name] headache, visual changes, nausea, vomiting, diarrhea, constipation, dizziness, abdominal pain, skin rash, fevers, chills, night sweats, weight loss, swollen lymph nodes, body aches, joint swelling, chest pain, shortness of breath, mood changes. POSITIVE muscle aches  Objective  Blood pressure 110/66, pulse 66, height 5\' 2"  (1.575 m), weight 199 lb (90.3 kg), SpO2 97 %.   General: [name] apparent  distress alert and oriented x3 mood and affect normal, dressed appropriately.  HEENT: Pupils equal, extraocular movements intact  Respiratory: Patient's speak in full sentences and does not appear short of breath  Cardiovascular: [name] lower extremity edema, non tender, [name] erythema  Skin: Warm dry intact with [name] signs of infection or rash on extremities or on axial skeleton.  Abdomen: Soft nontender  Neuro: Cranial nerves II through XII are intact, neurovascularly intact in all extremities with 2+ DTRs and 2+ pulses.  Lymph: [name] lymphadenopathy of posterior or anterior cervical chain or axillae bilaterally.  Gait normal with good balance and coordination.  MSK:  tender with muscle pain but also multiple joint pain.  Patient does have swelling of the DIPs of multiple fingers as well as the MP of the first and second fingers.  Severe tenderness to palpation in the piriformis on the right side.  Gluteal tendon is also severely tender.  Pain with 4-5 strength in the outside part of the gluteal tendon and the hip.  Negative straight leg test noted today.  After verbal consent patient was prepped with alcohol swab and with a 22-gauge 3 inch needle injected with 1 cc of 0.5% Marcaine and 1 cc of Kenalog 40 mg/mL into the right gluteal tendon sheath. [name] blood loss band placed     Impression and Recommendations:     This case required medical decision making of moderate complexity. The above documentation has been reviewed and is accurate and complete Lyndal Pulley, DO       Note: This dictation was prepared with Dragon dictation along with smaller phrase technology. Any transcriptional errors that result from this process are unintentional.

## 2019-08-12 NOTE — Assessment & Plan Note (Signed)
Patient given injection today and tolerated the procedure well.  I am hoping the patient does well.  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  Discussed icing regimen and home exercise.  Patient should increase activity as tolerated.  Follow-up again in 4 weeks if continue to have pain I do feel advanced imaging would be warranted including an MRI and likely then epidurals with patient history of ovarian cancer.

## 2019-08-12 NOTE — Patient Instructions (Signed)
Labs today If not better consider MRI back See me again in 4 weeks

## 2019-08-13 ENCOUNTER — Other Ambulatory Visit: Payer: Self-pay | Admitting: Internal Medicine

## 2019-08-14 LAB — PTH, INTACT AND CALCIUM
Calcium: 9.6 mg/dL (ref 8.6–10.4)
PTH: 45 pg/mL (ref 14–64)

## 2019-08-14 LAB — CALCIUM, IONIZED: Calcium, Ion: 5.01 mg/dL (ref 4.8–5.6)

## 2019-08-14 LAB — CYCLIC CITRUL PEPTIDE ANTIBODY, IGG: Cyclic Citrullin Peptide Ab: 35 UNITS — ABNORMAL HIGH

## 2019-08-14 LAB — ANA: Anti Nuclear Antibody (ANA): NEGATIVE

## 2019-08-14 LAB — RHEUMATOID FACTOR: Rheumatoid fact SerPl-aCnc: <14 IU/mL (ref <14)

## 2019-08-14 LAB — ANGIOTENSIN CONVERTING ENZYME: Angiotensin-Converting Enzyme: 33 U/L (ref 9–67)

## 2019-08-25 ENCOUNTER — Other Ambulatory Visit: Payer: Self-pay | Admitting: Family Medicine

## 2019-09-03 ENCOUNTER — Other Ambulatory Visit: Payer: Self-pay | Admitting: Family Medicine

## 2019-09-11 ENCOUNTER — Other Ambulatory Visit: Payer: Self-pay | Admitting: Family Medicine

## 2019-09-18 ENCOUNTER — Ambulatory Visit: Payer: BC Managed Care – PPO | Admitting: Family Medicine

## 2019-09-18 NOTE — Progress Notes (Deleted)
Alyssa [name] Phone: [phone] Subjective:    I'm seeing this patient by the request  of:  Binnie Rail, MD  CC:   [id number]   2/10/20201 Patient given injection today and tolerated the procedure well.  I am hoping the patient does well.  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  Discussed icing regimen and home exercise.  Patient should increase activity as tolerated.  Follow-up again in 4 weeks if continue to have pain I do feel advanced imaging would be warranted including an MRI and likely then epidurals with patient history of ovarian cancer.  Update 09/18/2019 Alyssa [name] is a 59 y.o. female coming in with complaint of right glute pain. Patient states       Past Medical History:  Diagnosis Date  . Arthritis    hands  . GERD (gastroesophageal reflux disease)   . Hypertension   . 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 SVD (spontaneous vaginal delivery)    x 3   Past Surgical History:  Procedure Laterality Date  . ABDOMINAL HYSTERECTOMY    . APPENDECTOMY     with hysterectomy  . ENDOMETRIAL ABLATION  2002  . LAPAROSCOPIC CHOLECYSTECTOMY  12/09/2016  . TUBAL LIGATION  1991  . WISDOM TOOTH EXTRACTION     Social History   Socioeconomic History  . Marital status: Married    Spouse name: Not on file  . Number of children: 3  . Years of education: Not on file  . Highest education level: Not on file  Occupational History  . Not on file  Tobacco Use  . Smoking status: Never Smoker  . Smokeless tobacco: Never Used  Substance and Sexual Activity  . Alcohol use: No  . Drug use: No  . Sexual activity: Yes    Birth control/protection: Post-menopausal    Comment: Hysterctomy  Other Topics Concern  . Not on file  Social History Narrative  . Not on file    Social Determinants of Health   Financial Resource Strain:   . Difficulty of Paying Living Expenses:   Food Insecurity:   . Worried About Charity fundraiser in the Last Year:   . Arboriculturist in the Last Year:   Transportation Needs:   . Film/video editor (Medical):   Marland Kitchen Lack of Transportation (Non-Medical):   Physical Activity:   . Days of Exercise per Week:   . Minutes of Exercise per Session:   Stress:   . Feeling of Stress :   Social Connections:   . Frequency of Communication with Friends and Family:   . Frequency of Social Gatherings with Friends and Family:   . Attends Religious Services:   . Active Member of Clubs or Organizations:   . Attends Archivist Meetings:   Marland Kitchen Marital Status:    Allergies  Allergen Reactions  . Codeine Itching  . Doxycycline     Per pt, she gets chest pain from doxycycline  . Penicillins Nausea And Vomiting   Family History  Problem Relation Age of Onset  . Cancer Mother 74       vulvar cancer  . Lung cancer Mother 50  . Stroke Father   . Prostate cancer Maternal Uncle   . Stroke Maternal Grandmother   . Prostate cancer Maternal Grandfather   .  Cervical cancer Paternal Grandmother   . Throat cancer Paternal Grandfather   . Prostate cancer Maternal Uncle   . Colon cancer Neg Hx   . Rectal cancer Neg Hx   . Stomach cancer Neg Hx     Current Outpatient Medications (Endocrine & Metabolic):  Marland Kitchen  DOTTI 0.025 MG/24HR, APPLY 1 PATCH TOPICALLY TO  SKIN TWICE WEEKLY  Current Outpatient Medications (Cardiovascular):  .  amLODipine (NORVASC) 5 MG tablet, TAKE 1 TABLET BY MOUTH  DAILY .  losartan (COZAAR) 50 MG tablet, TAKE 1 TABLET BY MOUTH  DAILY  Current Outpatient Medications (Respiratory):  .  diphenhydrAMINE (BENADRYL) 25 MG tablet, Take 50 mg at bedtime by mouth.  Current Outpatient Medications (Analgesics):  .  meloxicam (MOBIC) 15 MG tablet, Take 1 tablet by mouth once daily   Current Outpatient Medications  (Other):  Marland Kitchen  Diclofenac Sodium (PENNSAID) 2 % SOLN, Place 2 g onto the skin 2 (two) times daily. .  Diclofenac Sodium 2 % SOLN, Apply 1 pump twice daily as needed. .  gabapentin (NEURONTIN) 100 MG capsule, TAKE 2 CAPSULES BY MOUTH AT BEDTIME .  omeprazole (PRILOSEC) 20 MG capsule, TAKE 1 CAPSULE BY MOUTH  DAILY .  tiZANidine (ZANAFLEX) 4 MG tablet, TAKE 1 TABLET BY MOUTH EVERY 8 HOURS AS NEEDED FOR MUSCLE SPASM   Reviewed prior external information including notes and imaging from  primary care provider As well as notes that were available from care everywhere and other healthcare systems.  Past medical history, social, surgical and family history all reviewed in electronic medical record.  No pertanent information unless stated regarding to the chief complaint.   Review of Systems:  No headache, visual changes, nausea, vomiting, diarrhea, constipation, dizziness, abdominal pain, skin rash, fevers, chills, night sweats, weight loss, swollen lymph nodes, body aches, joint swelling, chest pain, shortness of breath, mood changes. POSITIVE muscle aches  Objective  There were no vitals taken for this visit.   General: No apparent distress alert and oriented x3 mood and affect normal, dressed appropriately.  HEENT: Pupils equal, extraocular movements intact  Respiratory: Patient's speak in full sentences and does not appear short of breath  Cardiovascular: No lower extremity edema, non tender, no erythema  Skin: Warm dry intact with no signs of infection or rash on extremities or on axial skeleton.  Abdomen: Soft nontender  Neuro: Cranial nerves II through XII are intact, neurovascularly intact in all extremities with 2+ DTRs and 2+ pulses.  Lymph: No lymphadenopathy of posterior or anterior cervical chain or axillae bilaterally.  Gait normal with good balance and coordination.  MSK:  Non tender with full range of motion and good stability and symmetric strength and tone of shoulders, elbows,  wrist, hip, knee and ankles bilaterally.     Impression and Recommendations:     This case required medical decision making of moderate complexity. The above documentation has been reviewed and is accurate and complete Jacqualin Combes       Note: This dictation was prepared with Dragon dictation along with smaller phrase technology. Any transcriptional errors that result from this process are unintentional.

## 2019-09-25 ENCOUNTER — Other Ambulatory Visit: Payer: Self-pay | Admitting: Family Medicine

## 2019-09-30 ENCOUNTER — Other Ambulatory Visit: Payer: Self-pay | Admitting: Family Medicine

## 2019-10-22 ENCOUNTER — Other Ambulatory Visit: Payer: Self-pay | Admitting: Family Medicine

## 2019-10-24 ENCOUNTER — Other Ambulatory Visit: Payer: Self-pay | Admitting: Family Medicine

## 2019-11-10 ENCOUNTER — Other Ambulatory Visit: Payer: Self-pay | Admitting: Family Medicine

## 2019-11-10 ENCOUNTER — Other Ambulatory Visit: Payer: Self-pay | Admitting: Oncology

## 2019-11-10 ENCOUNTER — Other Ambulatory Visit: Payer: Self-pay | Admitting: Gynecologic Oncology

## 2019-11-10 DIAGNOSIS — Z1231 Encounter for screening mammogram for malignant neoplasm of breast: Secondary | ICD-10-CM

## 2019-11-12 ENCOUNTER — Ambulatory Visit: Payer: BC Managed Care – PPO

## 2019-11-13 ENCOUNTER — Other Ambulatory Visit: Payer: Self-pay

## 2019-11-13 ENCOUNTER — Encounter: Payer: Self-pay | Admitting: Family Medicine

## 2019-11-13 MED ORDER — DICLOFENAC SODIUM 2 % EX SOLN: 2.0000 g | Freq: Two times a day (BID) | CUTANEOUS | 3 refills | Status: DC

## 2019-11-26 ENCOUNTER — Other Ambulatory Visit: Payer: Self-pay | Admitting: Family Medicine

## 2019-12-25 ENCOUNTER — Other Ambulatory Visit: Payer: Self-pay | Admitting: Family Medicine

## 2019-12-25 ENCOUNTER — Inpatient Hospital Stay: Payer: BC Managed Care – PPO | Attending: Gynecologic Oncology

## 2019-12-25 ENCOUNTER — Other Ambulatory Visit: Payer: Self-pay

## 2019-12-25 ENCOUNTER — Ambulatory Visit
Admission: RE | Admit: 2019-12-25 | Discharge: 2019-12-25 | Disposition: A | Discharge status: Home / Self Care | Payer: BC Managed Care – PPO | Source: Ambulatory Visit | Attending: Gynecologic Oncology | Admitting: Gynecologic Oncology

## 2019-12-25 DIAGNOSIS — Z90722 Acquired absence of ovaries, bilateral: Secondary | ICD-10-CM | POA: Insufficient documentation

## 2019-12-25 DIAGNOSIS — Z1231 Encounter for screening mammogram for malignant neoplasm of breast: Secondary | ICD-10-CM

## 2019-12-25 DIAGNOSIS — C562 Malignant neoplasm of left ovary: Secondary | ICD-10-CM

## 2019-12-25 DIAGNOSIS — Z90710 Acquired absence of both cervix and uterus: Secondary | ICD-10-CM | POA: Insufficient documentation

## 2019-12-25 DIAGNOSIS — Z9221 Personal history of antineoplastic chemotherapy: Secondary | ICD-10-CM | POA: Insufficient documentation

## 2019-12-25 LAB — CEA (IN HOUSE-CHCC): CEA (CHCC-In House): 1.13 ng/mL (ref 0.00–5.00)

## 2019-12-26 LAB — CA 125: Cancer Antigen (CA) 125: 9.1 U/mL (ref 0.0–38.1)

## 2019-12-29 ENCOUNTER — Other Ambulatory Visit: Payer: Self-pay

## 2019-12-29 ENCOUNTER — Inpatient Hospital Stay (HOSPITAL_BASED_OUTPATIENT_CLINIC_OR_DEPARTMENT_OTHER): Payer: BC Managed Care – PPO | Admitting: Gynecologic Oncology

## 2019-12-29 ENCOUNTER — Telehealth: Payer: Self-pay | Admitting: *Deleted

## 2019-12-29 ENCOUNTER — Encounter: Payer: Self-pay | Admitting: Gynecologic Oncology

## 2019-12-29 VITALS — BP 131/95 | HR 63 | Temp 98.2°F | Resp 20 | Ht 62.0 in | Wt 200.3 lb

## 2019-12-29 DIAGNOSIS — Z90722 Acquired absence of ovaries, bilateral: Secondary | ICD-10-CM | POA: Diagnosis not present

## 2019-12-29 DIAGNOSIS — C562 Malignant neoplasm of left ovary: Secondary | ICD-10-CM | POA: Diagnosis not present

## 2019-12-29 DIAGNOSIS — C569 Malignant neoplasm of unspecified ovary: Secondary | ICD-10-CM | POA: Diagnosis not present

## 2019-12-29 DIAGNOSIS — Z9221 Personal history of antineoplastic chemotherapy: Secondary | ICD-10-CM | POA: Diagnosis not present

## 2019-12-29 DIAGNOSIS — Z90710 Acquired absence of both cervix and uterus: Secondary | ICD-10-CM

## 2019-12-29 NOTE — Addendum Note (Signed)
Addended by: Thereasa Solo on: 12/29/2019 02:19 PM   Modules accepted: Orders

## 2019-12-29 NOTE — Telephone Encounter (Signed)
Patient left office without her AVS, mailed to the patient's home

## 2019-12-29 NOTE — Progress Notes (Signed)
Followup Note: Gyn-Onc   CC:  Chief Complaint  Patient presents with  . Malignant neoplasm of ovary, unspecified laterality ([place])    Follow Up    Assessment/Plan:  [name]  is a 59 y.o.  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 No evidence of recurrence on exam today.   1/ left pelvic mass on CT imaging:This appears most consistent with a lymphocyst. It is smooth and regular in appearnce and cystic in appearance. It is stable/decreasing on followup imaging. No additional surveillance is necessary.  2/ Followup: with me in 12 months.  3/ She was counseled regarding symptoms of recurrence and to notify us sooner if these develop.  4/ CA 125 and CEA at next visit in 12 months.   HPI: Alyssa [name] is a 59 year old G3 P3 who is seen in consultation at the request of Dr. Marin [name] for a 26 cm pelvic mass and elevated CA-125. The patient began noting vague abdominal fullness and distention since December 2015. When she was evaluated by her local ER at [facility] a CT of the abdomen and pelvis was obtained on 07/28/2014. This revealed a large (26 a 12 cm) cystic mass with enhancing septations and heterogeneous cystic spaces and peripheral nodular soft tissue component. It appeared to be arising from the left ovary. It occupied the entire mid pelvis. There was deviation of the otherwise normal appearing uterus. There was no ascites lymphadenopathy perineal carcinomatosis or omental cake identified. She was seen by Dr. Armandina [name] this further evaluated her with a CA-125 which was mildly elevated at 68. CEA was normal. Given the potential for underlying malignancy she was sent for my evaluation and consultation.  The patient is otherwise healthy woman. She denies abdominal pain, early satiety, or weight loss. Her  only prior abdominal surgery is a tubal ligation. She has also undergone endometrial ablation. The family history is not concerning for increased risk for malignancy (her mother had dual cancers of vulva and lung but was a heavy smoker). She is neck for performance status. She is premenopausal and continues to have regular menses with no intermenstrual bleeding.  She has a history of an ASCUS pap 3 years ago with negative hr HPV testing at that time.  She underwent TAH, BSO lymphadenectomy, omentectomy with Dr Alyssa [name] at [facility] on 08/11/14. 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 The primary tumor was 25 cm, no LDS I, 0 of 16 nodes involved , the tumor was intestinal type with expansile patent and associated with a mucinous borderline tumor of the ovary.  She went on to receive 6 cycles of adjuvant carboplatin and paclitaxel chemotherapy (from 08/31/14 to 12/14/14).  CA 125 at completion of therapy in June, 2016 was 13.   CT chest/abdo/pelvis on 01/17/15 : Status post total abdominal hysterectomy and bilateral salpingo-oophorectomy. Previously noted large pelvis mass has been resected. However, today's study does demonstrate a 6.0 x 4.7 x 7.1 cm well-defined low-attenuation lesion along the left pelvic sidewall (image 71 of series 2), suspicious for local recurrence of disease. No other definite cystic or solid pelvic mass is identified. No definite peritoneal nodularity.   03/03/15 Followup CT: showed reduction in size of size of left pelvic lymphocyst.  CA 125 on 06/18/16 was stable at 10.  Interval History:  she feels well with no complaints and no ongoing symptoms of chemotherapy. No symptoms from lymphocyst compression. S/p cholecystectomy in 2018. Her CA 125 had increased to 12 in November, 2018. It then decreased to 11 in December, 2018 and down to 10 in March, 2019.  Her CA 125 was normal at9.9 on 07/25/18 and again  stable at 9.7 on 01/23/19. Her CEA remained stable at 1.05 on 01/23/19.  Ca1 25 on December 25, 2019 was normal at 9.1.  CEA on December 25, 2019 was normal at 1.13.   Current Meds:  Outpatient Encounter Medications as of 12/29/2019  Medication Sig  . amLODipine (NORVASC) 5 MG tablet TAKE 1 TABLET BY MOUTH  DAILY  . Diclofenac Sodium (PENNSAID) 2 % SOLN Place 2 g onto the skin 2 (two) times daily.  . Diclofenac Sodium 2 % SOLN Apply 1 pump twice daily as needed.  . Diclofenac Sodium 2 % SOLN Place 2 g onto the skin 2 (two) times daily.  . diphenhydrAMINE (BENADRYL) 25 MG tablet Take 50 mg at bedtime by mouth.  . DOTTI 0.025 MG/24HR APPLY 1 PATCH TOPICALLY TO  SKIN TWICE WEEKLY  . gabapentin (NEURONTIN) 100 MG capsule TAKE 2 CAPSULES BY MOUTH AT BEDTIME  . losartan (COZAAR) 50 MG tablet TAKE 1 TABLET BY MOUTH  DAILY  . meloxicam (MOBIC) 15 MG tablet Take 1 tablet by mouth once daily  . omeprazole (PRILOSEC) 20 MG capsule TAKE 1 CAPSULE BY MOUTH  DAILY  . tiZANidine (ZANAFLEX) 4 MG tablet TAKE 1 TABLET BY MOUTH EVERY 8 HOURS AS NEEDED FOR MUSCLE SPASM  . [DISCONTINUED] gabapentin (NEURONTIN) 100 MG capsule TAKE 2 CAPSULES BY MOUTH AT BEDTIME  . [DISCONTINUED] meloxicam (MOBIC) 15 MG tablet Take 1 tablet by mouth once daily   No facility-administered encounter medications on file as of 12/29/2019.    Allergy:  Allergies  Allergen Reactions  . Codeine Itching  . Doxycycline     Per pt, she gets chest pain from doxycycline  . Penicillins Nausea And Vomiting    Social Hx:   Social History   Socioeconomic History  . Marital status: Married    Spouse name: Not on file  . Number of children: 3  . Years of education: Not on file  . Highest education level: Not on file  Occupational History  . Not on file  Tobacco Use  . Smoking status: Never Smoker  . Smokeless tobacco: Never Used  Vaping Use  . Vaping Use: Never used  Substance and Sexual Activity  . Alcohol use: No  . Drug use: No  .  Sexual activity: Yes    Birth control/protection: Post-menopausal    Comment: Hysterctomy  Other Topics Concern  . Not on file  Social History Narrative  . Not on file   Social Determinants of Health   Financial Resource Strain:   . Difficulty of Paying Living Expenses:   Food Insecurity:   . Worried About Charity fundraiser in the Last Year:   . Arboriculturist in the Last Year:   Transportation Needs:   . Film/video editor (Medical):   Marland Kitchen Lack of Transportation (Non-Medical):   Physical Activity:   . Days of Exercise per Week:   . Minutes of Exercise per Session:   Stress:   . Feeling of Stress :   Social Connections:   . Frequency of Communication with Friends and Family:   . Frequency of Social Gatherings with Friends and Family:   .  Attends Religious Services:   . Active Member of Clubs or Organizations:   . Attends Archivist Meetings:   Marland Kitchen Marital Status:   Intimate Partner Violence:   . Fear of Current or Ex-Partner:   . Emotionally Abused:   Marland Kitchen Physically Abused:   . Sexually Abused:     Past Surgical Hx:  Past Surgical History:  Procedure Laterality Date  . ABDOMINAL HYSTERECTOMY    . APPENDECTOMY     with hysterectomy  . ENDOMETRIAL ABLATION  2002  . LAPAROSCOPIC CHOLECYSTECTOMY  12/09/2016  . TUBAL LIGATION  1991  . WISDOM TOOTH EXTRACTION      Past Medical Hx:  Past Medical History:  Diagnosis Date  . Arthritis    hands  . GERD (gastroesophageal reflux disease)   . Hypertension   . Ovarian cancer ([place]) 08/2014   mucinous ovarian cancer  . SVD (spontaneous vaginal delivery)    x 3    Past Gynecological History:  G3P3 (SVD's), tubal ligation. LMP in December 2015.  No LMP recorded. Patient has had a hysterectomy.  Family Hx:  Family History  Problem Relation Age of Onset  . Cancer Mother 55       vulvar cancer  . Lung cancer Mother 32  . Stroke Father   . Prostate cancer Maternal Uncle   . Stroke Maternal Grandmother   .  Prostate cancer Maternal Grandfather   . Cervical cancer Paternal Grandmother   . Throat cancer Paternal Grandfather   . Prostate cancer Maternal Uncle   . Colon cancer Neg Hx   . Rectal cancer Neg Hx   . Stomach cancer Neg Hx     Review of Systems:  Constitutional  Feels well,    ENT Normal appearing ears and nares bilaterally Skin/Breast  No rash, sores, jaundice, itching, dryness Cardiovascular  No chest pain, shortness of breath, or edema  Pulmonary  No cough or wheeze.  Gastro Intestinal  No nausea, vomitting, or diarrhoea. No bright red blood per rectum, no abdominal pain, change in bowel movement, or constipation.  Genito Urinary  No frequency, urgency, dysuria, see HPI Musculo Skeletal  No myalgia, arthralgia, joint swelling or pain  Neurologic  No weakness, numbness, change in gait,  Psychology  No depression, anxiety, insomnia.   Vitals:  Blood pressure (!) 131/95, pulse 63, temperature 98.2 F (36.8 C), temperature source Oral, resp. rate 20, height 5\' 2"  (1.575 m), weight 200 lb 4.8 oz (90.9 kg), SpO2 99 %.  Physical Exam: WD in NAD Neck  Supple NROM, without any enlargements.  Lymph Node Survey No cervical supraclavicular or inguinal adenopathy Cardiovascular  Pulse normal rate, regularity and rhythm. S1 and S2 normal.  Lungs  Clear to auscultation bilateraly, without wheezes/crackles/rhonchi. Good air movement.  Skin  No rash/lesions/breakdown  Psychiatry  Alert and oriented to person, place, and time  Abdomen  Normoactive bowel sounds, abdomen soft, non-tender and mildly overweight without evidence of hernia. No palpable masses Back No CVA tenderness Genito Urinary  Vulva/vagina: Normal external female genitalia.  No lesions. No discharge or bleeding.  Bladder/urethra:  No lesions or masses, well supported bladder  Vagina: regular with no lesions  Cervix: surgically absent  Uterus: surgically absent  Adnexa:no longer able to palpate cystic  mass in left pelvis Rectal  Good tone, no masses no cul de sac nodularity.  Extremities  No bilateral cyanosis, clubbing or edema.   Thereasa Solo, MD   12/29/2019, 2:18 PM

## 2019-12-29 NOTE — Patient Instructions (Signed)
Dr Denman George will see you again in 12 months. Please call (approximately 4 months before hand) to schedule this appointment and a "lab appointment" for your CA 125.

## 2020-01-07 ENCOUNTER — Other Ambulatory Visit: Payer: Self-pay | Admitting: Family Medicine

## 2020-01-18 ENCOUNTER — Other Ambulatory Visit: Payer: Self-pay | Admitting: Family Medicine

## 2020-02-05 ENCOUNTER — Other Ambulatory Visit: Payer: Self-pay | Admitting: Family Medicine

## 2020-02-18 ENCOUNTER — Other Ambulatory Visit: Payer: Self-pay | Admitting: Family Medicine

## 2020-02-19 ENCOUNTER — Other Ambulatory Visit: Payer: Self-pay | Admitting: Internal Medicine

## 2020-02-19 DIAGNOSIS — N838 Other noninflammatory disorders of ovary, fallopian tube and broad ligament: Secondary | ICD-10-CM

## 2020-03-08 ENCOUNTER — Encounter: Payer: Self-pay | Admitting: Internal Medicine

## 2020-03-13 ENCOUNTER — Encounter: Payer: Self-pay | Admitting: Internal Medicine

## 2020-03-13 ENCOUNTER — Other Ambulatory Visit: Payer: Self-pay | Admitting: Internal Medicine

## 2020-03-14 MED ORDER — ONDANSETRON 4 MG PO TBDP
4.0000 mg | ORAL_TABLET | Freq: Three times a day (TID) | ORAL | 0 refills | Status: DC | PRN
Start: 2020-03-14 — End: 2021-06-01

## 2020-03-18 ENCOUNTER — Other Ambulatory Visit: Payer: Self-pay | Admitting: Family Medicine

## 2020-03-28 ENCOUNTER — Other Ambulatory Visit: Payer: Self-pay | Admitting: Family Medicine

## 2020-04-05 ENCOUNTER — Other Ambulatory Visit: Payer: Self-pay | Admitting: Family Medicine

## 2020-04-21 ENCOUNTER — Other Ambulatory Visit: Payer: Self-pay | Admitting: Family Medicine

## 2020-05-18 ENCOUNTER — Other Ambulatory Visit: Payer: Self-pay | Admitting: Family Medicine

## 2020-06-03 ENCOUNTER — Other Ambulatory Visit: Payer: Self-pay | Admitting: Family Medicine

## 2020-06-17 ENCOUNTER — Other Ambulatory Visit: Payer: Self-pay | Admitting: Family Medicine

## 2020-06-18 ENCOUNTER — Other Ambulatory Visit: Payer: Self-pay | Admitting: Gynecologic Oncology

## 2020-06-18 DIAGNOSIS — E8940 Asymptomatic postprocedural ovarian failure: Secondary | ICD-10-CM

## 2020-06-18 DIAGNOSIS — N9489 Other specified conditions associated with female genital organs and menstrual cycle: Secondary | ICD-10-CM

## 2020-06-18 DIAGNOSIS — R55 Syncope and collapse: Secondary | ICD-10-CM

## 2020-07-02 ENCOUNTER — Other Ambulatory Visit: Payer: Self-pay | Admitting: Internal Medicine

## 2020-07-08 ENCOUNTER — Other Ambulatory Visit: Payer: Self-pay | Admitting: Internal Medicine

## 2020-07-08 DIAGNOSIS — N838 Other noninflammatory disorders of ovary, fallopian tube and broad ligament: Secondary | ICD-10-CM

## 2020-07-18 ENCOUNTER — Other Ambulatory Visit: Payer: Self-pay | Admitting: Internal Medicine

## 2020-07-26 ENCOUNTER — Other Ambulatory Visit: Payer: Self-pay | Admitting: Family Medicine

## 2020-07-27 ENCOUNTER — Other Ambulatory Visit: Payer: Self-pay

## 2020-07-27 MED ORDER — TIZANIDINE HCL 4 MG PO TABS: 4.0000 mg | ORAL_TABLET | Freq: Every day | ORAL | 0 refills | Status: DC

## 2020-07-27 MED ORDER — MELOXICAM 15 MG PO TABS: 15.0000 mg | ORAL_TABLET | Freq: Every day | ORAL | 0 refills | Status: DC

## 2020-08-01 ENCOUNTER — Other Ambulatory Visit: Payer: Self-pay | Admitting: Internal Medicine

## 2020-08-16 ENCOUNTER — Other Ambulatory Visit: Payer: Self-pay | Admitting: Internal Medicine

## 2020-08-18 ENCOUNTER — Other Ambulatory Visit: Payer: Self-pay | Admitting: Family Medicine

## 2020-08-19 NOTE — Telephone Encounter (Signed)
Scheduled for March 8th

## 2020-08-19 NOTE — Telephone Encounter (Signed)
Sent patient message that she needs to schedule for refill requests.

## 2020-08-22 NOTE — Progress Notes (Signed)
I, Wendy Poet, LAT, ATC, am serving as scribe for Dr. [name].  Alyssa [name] is a 60 y.o. female who presents to [place] at [facility] today for L knee pain ongoing for the past 2 weeks w/ no known MOI.  She was last seen by Dr. Tamala Julian on 08/12/19 for R hip/glute pain w/ radiating pain to her R post knee and had a R glute injection. Today, pt reports pain starts in the posterior aspect of L knee that travels to the anterior aspect and down into lower leg and foot. Pt request refills of the medications prescribed by Dr. Tamala Julian. Pt notes meds help at night, but if she moves pain will wake her up.  Patient is a follow-up appointment scheduled with Dr. Tamala Julian March 8.  L knee mechanical symptoms: yes L knee swelling: yes- and down into foot Radiates: yes Aggravates: being on her feet, especially at work Treatments tried: creams- Pennsaid, Tylenol, IBU, Epsom salt  Pertinent review of systems: No fevers or chills  Relevant historical information: History of left ovarian cancer treated with excision and chemotherapy 2016   Exam:  BP 124/86 (BP Location: Right Arm, Patient Position: Sitting, Cuff Size: Normal)   Pulse 83   Ht 5\' 2"  (1.575 m)   Wt 194 lb 9.6 oz (88.3 kg)   SpO2 98%   BMI 35.59 kg/m  General: Well Developed, well nourished, and in no acute distress.   MSK: Left knee small effusion otherwise normal-appearing.  Range of motion full pain with extension.  Crepitation with range of motion. Tender palpation medial joint line. Stable ligamentous exam. Intact strength. Positive medial McMurray's test.  L-spine normal-appearing Nontender midline. Decreased lumbar motion. Minimally positive left-sided slump test. Lower extremity strength is intact. Reflexes are equal and normal throughout bilateral lower extremities.    Lab and Radiology Results  X-ray images left knee obtained today personally and independently interpreted Mild medial  compartment DJD.  Abnormal appearance of medial femoral condyle.  No fractures malformation visible. Await formal radiology review   EXAM: LUMBAR SPINE - COMPLETE 4+ VIEW  COMPARISON:  04/28/2018 radiographs and prior studies  FINDINGS: Five non rib-bearing lumbar type vertebra are again noted in normal alignment.  No acute fracture or subluxation.  Mild multilevel degenerative disc disease and moderate facet arthropathy in the LOWER lumbar spine noted.  Degenerative disc disease/spondylosis in the LOWER thoracic spine again noted.  No focal bony lesions or spondylolysis.  IMPRESSION: 1. No acute abnormality. 2. Multilevel degenerative changes again noted.   Electronically Signed   By: Margarette Canada M.D.   On: 03/14/2019 14:22 I, [name], personally (independently) visualized and performed the interpretation of the images attached in this note.  Procedure: Real-time Ultrasound Guided Injection of left knee superior lateral patellar space Device: Philips Affiniti 50G Images permanently stored and available for review in PACS Verbal informed consent obtained.  Discussed risks and benefits of procedure. Warned about infection bleeding damage to structures skin hypopigmentation and fat atrophy among others. Patient expresses understanding and agreement Time-out conducted.   Noted no overlying erythema, induration, or other signs of local infection.   Skin prepped in a sterile fashion.   Local anesthesia: Topical Ethyl chloride.   With sterile technique and under real time ultrasound guidance:  40 mg of Kenalog and 2 mL of Marcaine injected into knee joint. Fluid seen entering the joint capsule.   Completed without difficulty   Pain immediately resolved suggesting accurate placement of the medication.  Advised to call if fevers/chills, erythema, induration, drainage, or persistent bleeding.   Images permanently stored and available for review in the ultrasound  unit.  Impression: Technically successful ultrasound guided injection.       Assessment and Plan: 60 y.o. female with left knee and lower leg pain.  Multifactorial.  Majority of pain thought to be due to knee etiology probably related to DJD with possible medial meniscus component.  Plan for steroid injection.  Additionally she does have some probable lumbar radicular component as well.  Steroid injection should help sort this out a bit.  She has a follow-up appointment already scheduled with Dr. Tamala Julian from March 8.  If not significantly better at that point would pursue lumbar radiculopathy as a more likely possibility.  Would recommend physical therapy referral and possibly increasing gabapentin dose.  Of note her chronic medications of gabapentin meloxicam and tizanidine were refilled today.   PDMP not reviewed this encounter. Orders Placed This Encounter  Procedures  . [place] LIMITED JOINT SPACE STRUCTURES LOW LEFT(NO LINKED CHARGES)    Standing Status:   Future    Number of Occurrences:   1    Standing Expiration Date:   02/20/2021    Order Specific Question:   Reason for Exam (SYMPTOM  OR DIAGNOSIS REQUIRED)    Answer:   left knee pain    Order Specific Question:   Preferred imaging location?    Answer:   [place]  . DG Knee AP/LAT W/[place] Left    Standing Status:   Future    Number of Occurrences:   1    Standing Expiration Date:   08/23/2021    Order Specific Question:   Reason for Exam (SYMPTOM  OR DIAGNOSIS REQUIRED)    Answer:   left knee pain    Order Specific Question:   Preferred imaging location?    Answer:   [facility]    Order Specific Question:   Is patient pregnant?    Answer:   No   Meds ordered this encounter  Medications  . gabapentin (NEURONTIN) 100 MG capsule    Sig: Take 2 capsules (200 mg total) by mouth at bedtime.    Dispense:  180 capsule    Refill:  3  . meloxicam (MOBIC) 15 MG tablet    Sig: Take 1 tablet  (15 mg total) by mouth daily.    Dispense:  90 tablet    Refill:  3  . tiZANidine (ZANAFLEX) 4 MG tablet    Sig: Take 1 tablet (4 mg total) by mouth at bedtime.    Dispense:  90 tablet    Refill:  3     Discussed warning signs or symptoms. Please see discharge instructions. Patient expresses understanding.   The above documentation has been reviewed and is accurate and complete [name], M.D.

## 2020-08-23 ENCOUNTER — Ambulatory Visit (INDEPENDENT_AMBULATORY_CARE_PROVIDER_SITE_OTHER): Payer: BC Managed Care – PPO

## 2020-08-23 ENCOUNTER — Ambulatory Visit: Payer: Self-pay

## 2020-08-23 ENCOUNTER — Ambulatory Visit (INDEPENDENT_AMBULATORY_CARE_PROVIDER_SITE_OTHER): Payer: BC Managed Care – PPO | Admitting: Family Medicine

## 2020-08-23 ENCOUNTER — Other Ambulatory Visit: Payer: Self-pay

## 2020-08-23 VITALS — BP 124/86 | HR 83 | Ht 62.0 in | Wt 194.6 lb

## 2020-08-23 DIAGNOSIS — M25562 Pain in left knee: Secondary | ICD-10-CM

## 2020-08-23 DIAGNOSIS — M79605 Pain in left leg: Secondary | ICD-10-CM | POA: Diagnosis not present

## 2020-08-23 MED ORDER — TIZANIDINE HCL 4 MG PO TABS: 4.0000 mg | ORAL_TABLET | Freq: Every day | ORAL | 3 refills | Status: DC

## 2020-08-23 MED ORDER — MELOXICAM 15 MG PO TABS: 15.0000 mg | ORAL_TABLET | Freq: Every day | ORAL | 3 refills | Status: DC

## 2020-08-23 MED ORDER — GABAPENTIN 100 MG PO CAPS: 200.0000 mg | ORAL_CAPSULE | Freq: Every day | ORAL | 3 refills | Status: DC

## 2020-08-23 NOTE — Patient Instructions (Signed)
Thank you for coming in today.  Call or go to the ER if you develop a large red swollen joint with extreme pain or oozing puss.   Please get an Xray today before you leave  Follow up with Dr Tamala Julian as scheduled on March 8th.   Recheck sooner if needed.   Let me know if this is not working.   Medicines have been refilled.

## 2020-08-24 NOTE — Progress Notes (Signed)
X-ray left knee looks normal to radiology

## 2020-09-01 NOTE — Progress Notes (Signed)
Alyssa [name] Phone: [phone] Subjective:   Alyssa [name], am serving as a scribe for Dr. Hulan Saas. This visit occurred during the SARS-CoV-2 public health emergency.  Safety protocols were in place, including screening questions prior to the visit, additional usage of staff PPE, and extensive cleaning of exam room while observing appropriate contact time as indicated for disinfecting solutions.   I'm seeing this patient by the request  of:  Binnie Rail, MD  CC: Left leg pain  XTG:[id number]  Alyssa [name] is a 60 y.o. female coming in with complaint of left knee and lower back pain. Saw Dr. Georgina Snell on 08/23/2020 and received refills of zanaflex, gabapentin, and meloxicam. States that the knee is doing better. Continues to have pain in left leg that radiates down to the shin. Tender point over medial shin she states. Hurt when she dorsiflexes foot as well.  Patient states when she does dorsiflex the foot minimal tenderness radiation all the way up to her back.      Past Medical History:  Diagnosis Date  . Arthritis    hands  . GERD (gastroesophageal reflux disease)   . Hypertension   . Ovarian cancer ([place]) 08/2014   mucinous ovarian cancer  . SVD (spontaneous vaginal delivery)    x 3   Past Surgical History:  Procedure Laterality Date  . ABDOMINAL HYSTERECTOMY    . APPENDECTOMY     with hysterectomy  . ENDOMETRIAL ABLATION  2002  . LAPAROSCOPIC CHOLECYSTECTOMY  12/09/2016  . TUBAL LIGATION  1991  . WISDOM TOOTH EXTRACTION     Social History   Socioeconomic History  . Marital status: Married    Spouse name: Not on file  . Number of children: 3  . Years of education: Not on file  . Highest education level: Not on file  Occupational History  . Not on file  Tobacco Use  . Smoking status: Never Smoker  . Smokeless tobacco: Never Used  Vaping Use  . Vaping Use: Never used  Substance and  Sexual Activity  . Alcohol use: [name]  . Drug use: [name]  . Sexual activity: Yes    Birth control/protection: Post-menopausal    Comment: Hysterctomy  Other Topics Concern  . Not on file  Social History Narrative  . Not on file   Social Determinants of Health   Financial Resource Strain: Not on file  Food Insecurity: Not on file  Transportation Needs: Not on file  Physical Activity: Not on file  Stress: Not on file  Social Connections: Not on file   Allergies  Allergen Reactions  . Codeine Itching  . Doxycycline     Per pt, she gets chest pain from doxycycline  . Penicillins Nausea And Vomiting   Family History  Problem Relation Age of Onset  . Cancer Mother 65       vulvar cancer  . Lung cancer Mother 61  . Stroke Father   . Prostate cancer Maternal Uncle   . Stroke Maternal Grandmother   . Prostate cancer Maternal Grandfather   . Cervical cancer Paternal Grandmother   . Throat cancer Paternal Grandfather   . Prostate cancer Maternal Uncle   . Colon cancer Neg Hx   . Rectal cancer Neg Hx   . Stomach cancer Neg Hx     Current Outpatient Medications (Endocrine & Metabolic):  Marland Kitchen  DOTTI 0.025 MG/24HR, APPLY 1 PATCH TOPICALLY TO  SKIN TWICE WEEKLY  Current Outpatient Medications (Cardiovascular):  .  amLODipine (NORVASC) 5 MG tablet, TAKE 1 TABLET BY MOUTH  DAILY .  losartan (COZAAR) 50 MG tablet, TAKE 1 TABLET BY MOUTH  DAILY  Current Outpatient Medications (Respiratory):  .  diphenhydrAMINE (BENADRYL) 25 MG tablet, Take 50 mg at bedtime by mouth.  Current Outpatient Medications (Analgesics):  .  meloxicam (MOBIC) 15 MG tablet, Take 1 tablet (15 mg total) by mouth daily.   Current Outpatient Medications (Other):  Marland Kitchen  Diclofenac Sodium (PENNSAID) 2 % SOLN, Place 2 g onto the skin 2 (two) times daily. .  Diclofenac Sodium 2 % SOLN, Apply 1 pump twice daily as needed. .  Diclofenac Sodium 2 % SOLN, Place 2 g onto the skin 2 (two) times daily. Marland Kitchen  gabapentin (NEURONTIN)  100 MG capsule, Take 2 capsules (200 mg total) by mouth at bedtime. Marland Kitchen  omeprazole (PRILOSEC) 20 MG capsule, TAKE 1 CAPSULE BY MOUTH  DAILY .  ondansetron (ZOFRAN ODT) 4 MG disintegrating tablet, Take 1 tablet (4 mg total) by mouth every 8 (eight) hours as needed for nausea or vomiting. Marland Kitchen  tiZANidine (ZANAFLEX) 4 MG tablet, Take 1 tablet (4 mg total) by mouth at bedtime.   Reviewed prior external information including notes and imaging from  primary care provider As well as notes that were available from care everywhere and other healthcare systems.  Past medical history, social, surgical and family history all reviewed in electronic medical record.  [name] pertanent information unless stated regarding to the chief complaint.   Review of Systems:  [name] headache, visual changes, nausea, vomiting, diarrhea, constipation, dizziness, abdominal pain, skin rash, fevers, chills, night sweats, weight loss, swollen lymph nodes, joint swelling, chest pain, shortness of breath, mood changes. POSITIVE muscle aches, body aches  Objective  Blood pressure 118/86, pulse 78, height 5\' 2"  (1.575 m), weight 193 lb (87.5 kg), SpO2 97 %.   General: [name] apparent distress alert and oriented x3 mood and affect normal, dressed appropriately.  HEENT: Pupils equal, extraocular movements intact  Respiratory: Patient's speak in full sentences and does not appear short of breath  Cardiovascular: [name] lower extremity edema, non tender, [name] erythema  Gait antalgic MSK:   Low back exam shows some mild loss of lordosis.  Patient does have poor core strength.  Positive straight leg test at 20 degrees.  Patient does have some mild weakness with dorsiflexion of the left foot compared to the right side.  Patient does have tenderness over the mid tibia.  [name] pain in the calf.  Negative pain with compression of the calf.  Knee crepitus noted but [name] significant swelling.  Very mild instability with valgus and varus force.    Impression and  Recommendations:     The above documentation has been reviewed and is accurate and complete Lyndal Pulley, DO

## 2020-09-02 ENCOUNTER — Other Ambulatory Visit: Payer: Self-pay

## 2020-09-02 ENCOUNTER — Encounter: Payer: Self-pay | Admitting: Family Medicine

## 2020-09-02 ENCOUNTER — Ambulatory Visit (INDEPENDENT_AMBULATORY_CARE_PROVIDER_SITE_OTHER): Payer: BC Managed Care – PPO | Admitting: Family Medicine

## 2020-09-02 VITALS — BP 118/86 | HR 78 | Ht 62.0 in | Wt 193.0 lb

## 2020-09-02 DIAGNOSIS — G8929 Other chronic pain: Secondary | ICD-10-CM

## 2020-09-02 DIAGNOSIS — M545 Low back pain, unspecified: Secondary | ICD-10-CM

## 2020-09-02 NOTE — Patient Instructions (Signed)
Good to see you MRI lumbar For the leg try icing it  Avoid being barefoot Likely compensating   Increase calf pain swelling or redness of the leg seek medial attention Will write you when imaging results come in

## 2020-09-02 NOTE — Assessment & Plan Note (Signed)
Patient is now having more radicular symptoms.  Seems to be secondary to more of the back pain.  I do believe that patient was ambulating differently and has given her some more tibial pain.  Patient though does have a positive straight leg test today.  Possible even some mild weakness with dorsiflexion.  Patient has had chronic back pain for quite some time and I do feel that an MRI of the lumbar spine is warranted.  Patient can increase her gabapentin to 300 mg at night and see how she responds.  Discussed posture and ergonomics.  Depending on MRI patient could be a candidate for an epidural or nerve root injection.  Patient will follow-up after imaging to discuss.

## 2020-09-06 ENCOUNTER — Encounter: Payer: Self-pay | Admitting: Family Medicine

## 2020-09-06 ENCOUNTER — Ambulatory Visit: Payer: BC Managed Care – PPO | Admitting: Family Medicine

## 2020-09-09 NOTE — Telephone Encounter (Signed)
Appointment scheduled.

## 2020-09-11 DIAGNOSIS — Z79899 Other long term (current) drug therapy: Secondary | ICD-10-CM | POA: Diagnosis not present

## 2020-09-11 DIAGNOSIS — Z880 Allergy status to penicillin: Secondary | ICD-10-CM | POA: Diagnosis not present

## 2020-09-11 DIAGNOSIS — M1712 Unilateral primary osteoarthritis, left knee: Secondary | ICD-10-CM | POA: Diagnosis not present

## 2020-09-11 DIAGNOSIS — Z881 Allergy status to other antibiotic agents status: Secondary | ICD-10-CM | POA: Diagnosis not present

## 2020-09-11 DIAGNOSIS — G8929 Other chronic pain: Secondary | ICD-10-CM | POA: Diagnosis not present

## 2020-09-11 DIAGNOSIS — Z885 Allergy status to narcotic agent status: Secondary | ICD-10-CM | POA: Diagnosis not present

## 2020-09-11 DIAGNOSIS — K219 Gastro-esophageal reflux disease without esophagitis: Secondary | ICD-10-CM | POA: Diagnosis not present

## 2020-09-11 DIAGNOSIS — M25462 Effusion, left knee: Secondary | ICD-10-CM | POA: Diagnosis not present

## 2020-09-11 DIAGNOSIS — I10 Essential (primary) hypertension: Secondary | ICD-10-CM | POA: Diagnosis not present

## 2020-09-11 DIAGNOSIS — M25562 Pain in left knee: Secondary | ICD-10-CM | POA: Diagnosis not present

## 2020-09-15 ENCOUNTER — Other Ambulatory Visit: Payer: Self-pay

## 2020-09-15 ENCOUNTER — Encounter: Payer: Self-pay | Admitting: Family Medicine

## 2020-09-15 ENCOUNTER — Ambulatory Visit (INDEPENDENT_AMBULATORY_CARE_PROVIDER_SITE_OTHER): Payer: BC Managed Care – PPO | Admitting: Family Medicine

## 2020-09-15 ENCOUNTER — Ambulatory Visit: Payer: BC Managed Care – PPO | Admitting: Family Medicine

## 2020-09-15 ENCOUNTER — Ambulatory Visit: Payer: Self-pay

## 2020-09-15 VITALS — BP 150/90 | HR 89 | Ht 62.0 in | Wt 194.0 lb

## 2020-09-15 DIAGNOSIS — M2550 Pain in unspecified joint: Secondary | ICD-10-CM | POA: Diagnosis not present

## 2020-09-15 DIAGNOSIS — G8929 Other chronic pain: Secondary | ICD-10-CM | POA: Diagnosis not present

## 2020-09-15 DIAGNOSIS — M25562 Pain in left knee: Secondary | ICD-10-CM | POA: Insufficient documentation

## 2020-09-15 LAB — CBC WITH DIFFERENTIAL/PLATELET
Basophils Absolute: 0 10*3/uL (ref 0.0–0.1)
Basophils Relative: 0.2 % (ref 0.0–3.0)
Eosinophils Absolute: 0 10*3/uL (ref 0.0–0.7)
Eosinophils Relative: 0 % (ref 0.0–5.0)
HCT: 43.2 % (ref 36.0–46.0)
Hemoglobin: 14.6 g/dL (ref 12.0–15.0)
Lymphocytes Relative: 7.6 % — ABNORMAL LOW (ref 12.0–46.0)
Lymphs Abs: 0.6 10*3/uL — ABNORMAL LOW (ref 0.7–4.0)
MCHC: 33.9 g/dL (ref 30.0–36.0)
MCV: 89.2 fl (ref 78.0–100.0)
Monocytes Absolute: 0.2 10*3/uL (ref 0.1–1.0)
Monocytes Relative: 1.9 % — ABNORMAL LOW (ref 3.0–12.0)
Neutro Abs: 7.2 10*3/uL (ref 1.4–7.7)
Neutrophils Relative %: 90.3 % — ABNORMAL HIGH (ref 43.0–77.0)
Platelets: 271 10*3/uL (ref 150.0–400.0)
RBC: 4.84 Mil/uL (ref 3.87–5.11)
RDW: 14.2 % (ref 11.5–15.5)
WBC: 7.9 10*3/uL (ref 4.0–10.5)

## 2020-09-15 LAB — COMPREHENSIVE METABOLIC PANEL
ALT: 38 U/L — ABNORMAL HIGH (ref 0–35)
AST: 42 U/L — ABNORMAL HIGH (ref 0–37)
Albumin: 4.4 g/dL (ref 3.5–5.2)
Alkaline Phosphatase: 96 U/L (ref 39–117)
BUN: 22 mg/dL (ref 6–23)
CO2: 30 mEq/L (ref 19–32)
Calcium: 10.1 mg/dL (ref 8.4–10.5)
Chloride: 99 mEq/L (ref 96–112)
Creatinine, Ser: 1 mg/dL (ref 0.40–1.20)
GFR: 62.32 mL/min (ref >60.00)
Glucose, Bld: 130 mg/dL — ABNORMAL HIGH (ref 70–99)
Potassium: 4.5 mEq/L (ref 3.5–5.1)
Sodium: 137 mEq/L (ref 135–145)
Total Bilirubin: 0.6 mg/dL (ref 0.2–1.2)
Total Protein: 7.7 g/dL (ref 6.0–8.3)

## 2020-09-15 LAB — VITAMIN D 25 HYDROXY (VIT D DEFICIENCY, FRACTURES): VITD: 24.81 ng/mL — ABNORMAL LOW (ref 30.00–100.00)

## 2020-09-15 LAB — SEDIMENTATION RATE: Sed Rate: 33 mm/hr — ABNORMAL HIGH (ref 0–30)

## 2020-09-15 NOTE — Patient Instructions (Addendum)
Good to see you Knee injection Labs today MRI of the left knee  Hinge brace for support If worsening pain go back to emergency room Stop prednisone See me again in after the MRI

## 2020-09-15 NOTE — Assessment & Plan Note (Signed)
Patient is having worsening left knee pain.  Was seen by another provider and given an injection previously.  Unfortunately patient is having difficulty walking.  On ultrasound no specific findings but some mild pedis anserine bursitis noted.  Patient given an injection today.  Patient has had other health problems previously and will get other laboratory work-up to make sure nothing else is contributing including a D-dimer.  No pain in the calf no noted today and no significant redness.  At this point would like patient to also have an MRI secondary to patient's locking of the knee and the instability.  Given a hinged brace right now.  Patient did want the injection today because she needed something to allow her to continue to work.  We will get the MRI still of the back as well as the MRI of this and see what is contributing to more of the discomfort and pain.

## 2020-09-15 NOTE — Progress Notes (Signed)
[facility] [address] [place] Phone: [phone] Subjective:   I [name] am serving as a Education administrator for Dr. Hulan Saas.  This visit occurred during the SARS-CoV-2 public health emergency.  Safety protocols were in place, including screening questions prior to the visit, additional usage of staff PPE, and extensive cleaning of exam room while observing appropriate contact time as indicated for disinfecting solutions.   I'm seeing this patient by the request  of:  Binnie Rail, MD  CC: Left knee pain  VHQ:[id number]  Milessa Hogan is a 60 y.o. female coming in with complaint of left knee pain. Was seen by Dr. Georgina Snell in February for L knee pain. No MOI. Pain is posterior. Patient states her knee went out on her last Friday. Went to ER where they took xrays and gave her pain medication. Knee is swollen and the pain radiates down the knee. Pain is around the patellar. Taking prednisone and tramadol. Multiple ROM of motion cause pain. Pain at night that gets worse once she gets up. Right foot numbness and tingling. States it was a side effect the medications she is taking. States the injection helped on Feburary 22nd.   Xray L knee 08/23/2020 IMPRESSION: No significant osseous abnormality. Patient was given an injection February 22 in the left knee by another provider.  Patient states that this did help initially but now having worsening pain again.  Patient states that it seems to be sore even to the touch.  Having difficulty with daily activities as well as with her job.  Patient admits to continuing to try to work.  Patient continues to have the radiation down the leg and does have the back pain today.  Patient denies any fevers chills but does not feel good.    Past Medical History:  Diagnosis Date  . Arthritis    hands  . GERD (gastroesophageal reflux disease)   . Hypertension   . 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 SVD (spontaneous vaginal delivery)    x 3   Past Surgical History:  Procedure Laterality Date  . ABDOMINAL HYSTERECTOMY    . APPENDECTOMY     with hysterectomy  . ENDOMETRIAL ABLATION  2002  . LAPAROSCOPIC CHOLECYSTECTOMY  12/09/2016  . TUBAL LIGATION  1991  . WISDOM TOOTH EXTRACTION     Social History   Socioeconomic History  . Marital status: Married    Spouse name: Not on file  . Number of children: 3  . Years of education: Not on file  . Highest education level: Not on file  Occupational History  . Not on file  Tobacco Use  . Smoking status: Never Smoker  . Smokeless tobacco: Never Used  Vaping Use  . Vaping Use: Never used  Substance and Sexual Activity  . Alcohol use: No  . Drug use: No  . Sexual activity: Yes    Birth control/protection: Post-menopausal    Comment: Hysterctomy  Other Topics Concern  . Not on file  Social History Narrative  . Not on file   Social Determinants of Health   Financial Resource Strain: Not on file  Food Insecurity: Not on file  Transportation Needs: Not on file  Physical Activity: Not on file  Stress: Not on file  Social Connections: Not on file   Allergies  Allergen Reactions  . Codeine Itching  . Doxycycline     Per pt, she gets chest pain from  doxycycline  . Penicillins Nausea And Vomiting   Family History  Problem Relation Age of Onset  . Cancer Mother 55       vulvar cancer  . Lung cancer Mother 58  . Stroke Father   . Prostate cancer Maternal Uncle   . Stroke Maternal Grandmother   . Prostate cancer Maternal Grandfather   . Cervical cancer Paternal Grandmother   . Throat cancer Paternal Grandfather   . Prostate cancer Maternal Uncle   . Colon cancer Neg Hx   . Rectal cancer Neg Hx   . Stomach cancer Neg Hx     Current Outpatient Medications (Endocrine & Metabolic):  Marland Kitchen  DOTTI 0.025 MG/24HR, APPLY 1 PATCH TOPICALLY TO  SKIN TWICE WEEKLY  Current Outpatient Medications (Cardiovascular):  .   amLODipine (NORVASC) 5 MG tablet, TAKE 1 TABLET BY MOUTH  DAILY .  losartan (COZAAR) 50 MG tablet, TAKE 1 TABLET BY MOUTH  DAILY  Current Outpatient Medications (Respiratory):  .  diphenhydrAMINE (BENADRYL) 25 MG tablet, Take 50 mg at bedtime by mouth.  Current Outpatient Medications (Analgesics):  .  meloxicam (MOBIC) 15 MG tablet, Take 1 tablet (15 mg total) by mouth daily.   Current Outpatient Medications (Other):  Marland Kitchen  Diclofenac Sodium (PENNSAID) 2 % SOLN, Place 2 g onto the skin 2 (two) times daily. .  Diclofenac Sodium 2 % SOLN, Apply 1 pump twice daily as needed. .  Diclofenac Sodium 2 % SOLN, Place 2 g onto the skin 2 (two) times daily. Marland Kitchen  gabapentin (NEURONTIN) 100 MG capsule, Take 2 capsules (200 mg total) by mouth at bedtime. Marland Kitchen  omeprazole (PRILOSEC) 20 MG capsule, TAKE 1 CAPSULE BY MOUTH  DAILY .  ondansetron (ZOFRAN ODT) 4 MG disintegrating tablet, Take 1 tablet (4 mg total) by mouth every 8 (eight) hours as needed for nausea or vomiting. Marland Kitchen  tiZANidine (ZANAFLEX) 4 MG tablet, Take 1 tablet (4 mg total) by mouth at bedtime.   Reviewed prior external information including notes and imaging from  primary care provider As well as notes that were available from care everywhere and other healthcare systems.  Past medical history, social, surgical and family history all reviewed in electronic medical record.  No pertanent information unless stated regarding to the chief complaint.   Review of Systems:  No headache, visual changes, nausea, vomiting, diarrhea, constipation, dizziness, abdominal pain, skin rash, fevers, chills, night sweats, weight loss, swollen lymph nodes, body aches, joint swelling, chest pain, shortness of breath, mood changes. POSITIVE muscle aches  Objective  Blood pressure (!) 150/90, pulse 89, height 5\' 2"  (1.575 m), weight 194 lb (88 kg), SpO2 98 %.   General: No apparent distress alert and oriented x3 mood and affect normal, dressed appropriately.  HEENT:  Pupils equal, extraocular movements intact  Respiratory: Patient's speak in full sentences and does not appear short of breath  Cardiovascular: No lower extremity edema, non tender, no erythema  Gait severely antalgic MSK: Pain in the left knee seems to be more over the pes anserine area.  Worsening pain with full extension of the leg.  Patient has some mild pain over the medial joint space.  Mild pain over the medial aspect of the patella also noted.  Patient's lateral joint seems to be unremarkable.  Mild crepitus with range of motion noted.  Pain to palpation is of proportion.  Limited musculoskeletal ultrasound was performed and interpreted by Lyndal Pulley  Limited ultrasound of patient's left knee shows the patient does have  some mild arthritic changes noted of the patellofemoral, lateral and medial joint space.  Patient's meniscus appears to be fairly unremarkable.  No cyst noted in the popliteal area.  Patient does have swelling more over the Pes anserine area can be consistent with either partial tearing of the hamstring tendon or the possibility of a bursitis.  Difficult to assess for sure. 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  Verbal informed consent obtained.  Time-out conducted.  Noted no overlying erythema, induration, or other signs of local infection.  Skin prepped in a sterile fashion.  Local anesthesia: Topical Ethyl chloride.  With sterile technique and under real time ultrasound guidance: With a 25-gauge half inch needle injected into the bursa on the anterior medial aspect of the knee with a total of 0.5 cc of 0.5% Marcaine and  0.5 cc of Kenalog 10 mg/mL Completed without difficulty  Pain immediately improved suggesting accurate placement of the medication.  Patient still ambulating with the leg. Advised to call if fevers/chills, erythema, induration, drainage, or persistent bleeding.  Impression: Technically successful ultrasound guided injection.   Impression and Recommendations:     The above documentation has been reviewed and is accurate and complete Lyndal Pulley, DO

## 2020-09-16 ENCOUNTER — Other Ambulatory Visit: Payer: Self-pay

## 2020-09-16 ENCOUNTER — Ambulatory Visit (HOSPITAL_COMMUNITY)
Admission: RE | Admit: 2020-09-16 | Discharge: 2020-09-16 | Disposition: A | Discharge status: Home / Self Care | Payer: BC Managed Care – PPO | Source: Ambulatory Visit | Attending: Family Medicine | Admitting: Family Medicine

## 2020-09-16 ENCOUNTER — Encounter: Payer: Self-pay | Admitting: Family Medicine

## 2020-09-16 DIAGNOSIS — M79662 Pain in left lower leg: Secondary | ICD-10-CM

## 2020-09-16 LAB — D-DIMER, QUANTITATIVE: D-Dimer, Quant: 0.5 mcg/mL FEU — ABNORMAL HIGH (ref <0.50)

## 2020-09-16 NOTE — Telephone Encounter (Signed)
Patient called following up on this message.

## 2020-09-20 ENCOUNTER — Encounter (HOSPITAL_COMMUNITY): Payer: BC Managed Care – PPO

## 2020-09-20 ENCOUNTER — Encounter: Payer: Self-pay | Admitting: Internal Medicine

## 2020-09-21 ENCOUNTER — Telehealth: Payer: Self-pay

## 2020-09-21 NOTE — Telephone Encounter (Signed)
Told Alyssa [name] that the Liver enzymes may have been elevated from the prednisone taper for her knee pain when she was in the ED 09-11-20.   She is concerned that it may be related to an ovarian recurrence. Told her that she should talk with her PCP and get recommendations as she is concerned about elevation. She may want to repeat the labs in a few weeks reevaluate levels.  If PCP concerned regarding cancer recurrence, she can be seen sooner then June of 2022 per Dr.Rossi's office note  12-29-19. Pt verbalized understanding.

## 2020-09-23 ENCOUNTER — Ambulatory Visit
Admission: RE | Admit: 2020-09-23 | Discharge: 2020-09-23 | Disposition: A | Discharge status: Home / Self Care | Payer: BC Managed Care – PPO | Source: Ambulatory Visit | Attending: Family Medicine | Admitting: Family Medicine

## 2020-09-23 ENCOUNTER — Other Ambulatory Visit: Payer: Self-pay

## 2020-09-23 DIAGNOSIS — M48061 Spinal stenosis, lumbar region without neurogenic claudication: Secondary | ICD-10-CM | POA: Diagnosis not present

## 2020-09-23 DIAGNOSIS — M545 Low back pain, unspecified: Secondary | ICD-10-CM

## 2020-09-23 DIAGNOSIS — G8929 Other chronic pain: Secondary | ICD-10-CM

## 2020-09-24 ENCOUNTER — Encounter: Payer: Self-pay | Admitting: Family Medicine

## 2020-09-27 ENCOUNTER — Other Ambulatory Visit: Payer: Self-pay

## 2020-09-27 DIAGNOSIS — M5416 Radiculopathy, lumbar region: Secondary | ICD-10-CM

## 2020-09-30 ENCOUNTER — Encounter: Payer: Self-pay | Admitting: Family Medicine

## 2020-09-30 ENCOUNTER — Other Ambulatory Visit: Payer: Self-pay

## 2020-09-30 MED ORDER — GABAPENTIN 300 MG PO CAPS: 300.0000 mg | ORAL_CAPSULE | Freq: Every day | ORAL | 0 refills | Status: DC

## 2020-10-03 ENCOUNTER — Ambulatory Visit
Admission: RE | Admit: 2020-10-03 | Discharge: 2020-10-03 | Disposition: A | Discharge status: Home / Self Care | Payer: BC Managed Care – PPO | Source: Ambulatory Visit | Attending: Family Medicine | Admitting: Family Medicine

## 2020-10-03 ENCOUNTER — Other Ambulatory Visit: Payer: Self-pay

## 2020-10-03 DIAGNOSIS — M25562 Pain in left knee: Secondary | ICD-10-CM

## 2020-10-03 DIAGNOSIS — M7989 Other specified soft tissue disorders: Secondary | ICD-10-CM | POA: Diagnosis not present

## 2020-10-03 DIAGNOSIS — M1712 Unilateral primary osteoarthritis, left knee: Secondary | ICD-10-CM | POA: Diagnosis not present

## 2020-10-03 DIAGNOSIS — S83242A Other tear of medial meniscus, current injury, left knee, initial encounter: Secondary | ICD-10-CM | POA: Diagnosis not present

## 2020-10-03 DIAGNOSIS — G8929 Other chronic pain: Secondary | ICD-10-CM

## 2020-10-03 DIAGNOSIS — S82142A Displaced bicondylar fracture of left tibia, initial encounter for closed fracture: Secondary | ICD-10-CM | POA: Diagnosis not present

## 2020-10-19 ENCOUNTER — Ambulatory Visit
Admission: RE | Admit: 2020-10-19 | Discharge: 2020-10-19 | Disposition: A | Discharge status: Home / Self Care | Payer: BC Managed Care – PPO | Source: Ambulatory Visit | Attending: Family Medicine | Admitting: Family Medicine

## 2020-10-19 ENCOUNTER — Encounter: Payer: Self-pay | Admitting: Family Medicine

## 2020-10-19 DIAGNOSIS — M47817 Spondylosis without myelopathy or radiculopathy, lumbosacral region: Secondary | ICD-10-CM | POA: Diagnosis not present

## 2020-10-19 DIAGNOSIS — M5416 Radiculopathy, lumbar region: Secondary | ICD-10-CM

## 2020-10-19 MED ORDER — METHYLPREDNISOLONE ACETATE 40 MG/ML INJ SUSP (RADIOLOG
80.0000 mg | Freq: Once | INTRAMUSCULAR | Status: AC
  Administered 2020-10-19: 80 mg via INTRA_ARTICULAR

## 2020-10-19 MED ORDER — IOPAMIDOL (ISOVUE-M 200) INJECTION 41%
1.0000 mL | Freq: Once | INTRAMUSCULAR | Status: AC
  Administered 2020-10-19: 1 mL via INTRA_ARTICULAR

## 2020-10-19 NOTE — Discharge Instructions (Signed)

## 2020-10-26 ENCOUNTER — Other Ambulatory Visit: Payer: Self-pay | Admitting: Family Medicine

## 2020-10-28 ENCOUNTER — Other Ambulatory Visit: Payer: Self-pay | Admitting: Family Medicine

## 2020-12-27 ENCOUNTER — Other Ambulatory Visit: Payer: Self-pay | Admitting: Gynecologic Oncology

## 2020-12-27 DIAGNOSIS — C569 Malignant neoplasm of unspecified ovary: Secondary | ICD-10-CM

## 2021-01-05 ENCOUNTER — Other Ambulatory Visit: Payer: Self-pay | Admitting: Family Medicine

## 2021-02-07 ENCOUNTER — Other Ambulatory Visit: Payer: Self-pay | Admitting: Family Medicine

## 2021-02-15 ENCOUNTER — Other Ambulatory Visit: Payer: Self-pay | Admitting: Family Medicine

## 2021-02-17 ENCOUNTER — Telehealth: Payer: Self-pay

## 2021-02-17 ENCOUNTER — Encounter: Payer: Self-pay | Admitting: Internal Medicine

## 2021-02-17 NOTE — Telephone Encounter (Signed)
1.Medication Requested: Amlodipine  2. Pharmacy (Name, [facility], [place]):  3. On Med List: yes  4. Last Visit with PCP: 04/03/2019  5. Next visit date with PCP: 02/28/2021   Agent: Please be advised that RX refills may take up to 3 business days. We ask that you follow-up with your pharmacy.   Notified patient that due to her not being seen since 2020 we a not willing to do a short supply. A 30 day supply was sent back in January. Appointment has been scheduled. Patient states she has 4 pills left and need so to hold her over.

## 2021-02-18 MED ORDER — AMLODIPINE BESYLATE 5 MG PO TABS: 5.0000 mg | ORAL_TABLET | Freq: Every day | ORAL | 0 refills | Status: DC

## 2021-02-18 NOTE — Addendum Note (Signed)
Addended by: Binnie Rail on: 02/18/2021 11:08 AM   Modules accepted: Orders

## 2021-02-21 ENCOUNTER — Encounter: Payer: Self-pay | Admitting: Internal Medicine

## 2021-02-22 ENCOUNTER — Other Ambulatory Visit: Payer: Self-pay

## 2021-02-22 MED ORDER — AMLODIPINE BESYLATE 5 MG PO TABS: 5.0000 mg | ORAL_TABLET | Freq: Every day | ORAL | 0 refills | Status: DC

## 2021-02-27 ENCOUNTER — Other Ambulatory Visit: Payer: Self-pay

## 2021-02-27 NOTE — Patient Instructions (Addendum)
Blood work was ordered.     Medications changes include :   start cymbalta 30 mg daily  Your prescription(s) have been submitted to your pharmacy. Please take as directed and contact our office if you believe you are having problem(s) with the medication(s).   Please followup in 6 months   Health Maintenance, Female Adopting a healthy lifestyle and getting preventive care are important in promoting health and wellness. Ask your health care provider about: The right schedule for you to have regular tests and exams. Things you can do on your own to prevent diseases and keep yourself healthy. What should I know about diet, weight, and exercise? Eat a healthy diet  Eat a diet that includes plenty of vegetables, fruits, low-fat dairy products, and lean protein. Do not eat a lot of foods that are high in solid fats, added sugars, or sodium.  Maintain a healthy weight Body mass index (BMI) is used to identify weight problems. It estimates body fat based on height and weight. Your health care provider can help determineyour BMI and help you achieve or maintain a healthy weight. Get regular exercise Get regular exercise. This is one of the most important things you can do for your health. Most adults should: Exercise for at least 150 minutes each week. The exercise should increase your heart rate and make you sweat (moderate-intensity exercise). Do strengthening exercises at least twice a week. This is in addition to the moderate-intensity exercise. Spend less time sitting. Even light physical activity can be beneficial. Watch cholesterol and blood lipids Have your blood tested for lipids and cholesterol at 58 years of age, then havethis test every 5 years. Have your cholesterol levels checked more often if: Your lipid or cholesterol levels are high. You are older than 58 years of age. You are at high risk for heart disease. What should I know about cancer screening? Depending on your  health history and family history, you may need to have cancer screening at various ages. This may include screening for: Breast cancer. Cervical cancer. Colorectal cancer. Skin cancer. Lung cancer. What should I know about heart disease, diabetes, and high blood pressure? Blood pressure and heart disease High blood pressure causes heart disease and increases the risk of stroke. This is more likely to develop in people who have high blood pressure readings, are of African descent, or are overweight. Have your blood pressure checked: Every 3-5 years if you are 42-25 years of age. Every year if you are 33 years old or older. Diabetes Have regular diabetes screenings. This checks your fasting blood sugar level. Have the screening done: Once every three years after age 17 if you are at a normal weight and have a low risk for diabetes. More often and at a younger age if you are overweight or have a high risk for diabetes. What should I know about preventing infection? Hepatitis B If you have a higher risk for hepatitis B, you should be screened for this virus. Talk with your health care provider to find out if you are at risk forhepatitis B infection. Hepatitis C Testing is recommended for: Everyone born from [birth year] through 1963. Anyone with known risk factors for hepatitis C. Sexually transmitted infections (STIs) Get screened for STIs, including gonorrhea and chlamydia, if: You are sexually active and are younger than 58 years of age. You are older than 58 years of age and your health care provider tells you that you are at risk for this type of infection.  Your sexual activity has changed since you were last screened, and you are at increased risk for chlamydia or gonorrhea. Ask your health care provider if you are at risk. Ask your health care provider about whether you are at high risk for HIV. Your health care provider may recommend a prescription medicine to help prevent HIV infection. If  you choose to take medicine to prevent HIV, you should first get tested for HIV. You should then be tested every 3 months for as long as you are taking the medicine. Pregnancy If you are about to stop having your period (premenopausal) and you may become pregnant, seek counseling before you get pregnant. Take 400 to 800 micrograms (mcg) of folic acid every day if you become pregnant. Ask for birth control (contraception) if you want to prevent pregnancy. Osteoporosis and menopause Osteoporosis is a disease in which the bones lose minerals and strength with aging. This can result in bone fractures. If you are 71 years old or older, or if you are at risk for osteoporosis and fractures, ask your health care provider if you should: Be screened for bone loss. Take a calcium or vitamin D supplement to lower your risk of fractures. Be given hormone replacement therapy (HRT) to treat symptoms of menopause. Follow these instructions at home: Lifestyle Do not use any products that contain nicotine or tobacco, such as cigarettes, e-cigarettes, and chewing tobacco. If you need help quitting, ask your health care provider. Do not use street drugs. Do not share needles. Ask your health care provider for help if you need support or information about quitting drugs. Alcohol use Do not drink alcohol if: Your health care provider tells you not to drink. You are pregnant, may be pregnant, or are planning to become pregnant. If you drink alcohol: Limit how much you use to 0-1 drink a day. Limit intake if you are breastfeeding. Be aware of how much alcohol is in your drink. In the [place] one drink equals one 12 oz bottle of beer (355 mL), one 5 oz glass of wine (148 mL), or one 1 oz glass of hard liquor (44 mL). General instructions Schedule regular health, dental, and eye exams. Stay current with your vaccines. Tell your health care provider if: You often feel depressed. You have ever been abused or do not  feel safe at home. Summary Adopting a healthy lifestyle and getting preventive care are important in promoting health and wellness. Follow your health care provider's instructions about healthy diet, exercising, and getting tested or screened for diseases. Follow your health care provider's instructions on monitoring your cholesterol and blood pressure. This information is not intended to replace advice given to you by your health care provider. Make sure you discuss any questions you have with your healthcare provider. Document Revised: 06/11/2018 Document Reviewed: 06/11/2018 Elsevier Patient Education  2022 [employer].

## 2021-02-27 NOTE — Progress Notes (Signed)
Subjective:    Patient ID: Alyssa [name], female    DOB: 08/01/1960, 60 y.o.   MRN: [id number]   This visit occurred during the SARS-CoV-2 public health emergency.  Safety protocols were in place, including screening questions prior to the visit, additional usage of staff PPE, and extensive cleaning of exam room while observing appropriate contact time as indicated for disinfecting solutions.    HPI She is here for a physical exam.   Sister died 3 weeks ago from lung cancer.  She had been sick for several years.  Her daughter is divorced and started doing drugs.  She now has her 94 yo granddaughter.  Her daughter still comes by daily, but is not able to care for her daughter.  She is doing ok with everything.  She is doing with lumbar radiculopathy.  Recently she has not been sleeping as well   Medications and allergies reviewed with patient and updated if appropriate.  Patient Active Problem List   Diagnosis Date Noted   Left medial knee pain 09/15/2020   Gluteal tendonitis of right buttock 04/03/2019   Frozen shoulder 04/28/2018   Greater trochanteric bursitis of right hip 04/28/2018   Chronic low back pain 04/10/2018   Left shoulder pain 04/10/2018   Palpitations 10/11/2017   Hand arthritis 10/11/2017   Midline low back pain without sciatica 10/11/2017   Genetic testing 12/27/2016   Prediabetes 10/04/2016   Right hip pain 06/19/2016   Family history of prostate cancer 12/01/2015   Esophageal reflux 09/15/2015   Obesity (BMI 30-39.9) 06/03/2015   Essential hypertension 11/11/2014   Chemotherapy-induced peripheral neuropathy ([place]) 10/18/2014   Vasomotor instability 10/05/2014   Carcinoma of left ovary ([place]) 08/24/2014   Surgical menopause 08/24/2014   Ovarian cancer ([place]) 08/02/2014    Current Outpatient Medications on File Prior to Visit  Medication Sig Dispense Refill   amLODipine (NORVASC) 5 MG tablet Take 1 tablet (5 mg total) by mouth daily. 7 tablet 0    Diclofenac Sodium 2 % SOLN Place 2 g onto the skin 2 (two) times daily. 112 g 3   diphenhydrAMINE (BENADRYL) 25 MG tablet Take 50 mg at bedtime by mouth.     DOTTI 0.025 MG/24HR APPLY 1 PATCH TOPICALLY TO  SKIN TWICE WEEKLY 24 patch 3   gabapentin (NEURONTIN) 100 MG capsule Take 200 mg by mouth at bedtime.     gabapentin (NEURONTIN) 300 MG capsule Take 1 capsule by mouth at bedtime 30 capsule 0   losartan (COZAAR) 50 MG tablet TAKE 1 TABLET BY MOUTH  DAILY 30 tablet 0   meloxicam (MOBIC) 15 MG tablet Take 1 tablet (15 mg total) by mouth daily. 90 tablet 3   omeprazole (PRILOSEC) 20 MG capsule TAKE 1 CAPSULE BY MOUTH  DAILY 90 capsule 3   ondansetron (ZOFRAN ODT) 4 MG disintegrating tablet Take 1 tablet (4 mg total) by mouth every 8 (eight) hours as needed for nausea or vomiting. 20 tablet 0   tiZANidine (ZANAFLEX) 4 MG tablet Take 1 tablet (4 mg total) by mouth at bedtime. 90 tablet 3   No current facility-administered medications on file prior to visit.    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   SVD (spontaneous vaginal delivery)    x 3    Past Surgical History:  Procedure Laterality Date  ABDOMINAL HYSTERECTOMY     APPENDECTOMY     with hysterectomy   ENDOMETRIAL ABLATION  2002   LAPAROSCOPIC CHOLECYSTECTOMY  12/09/2016   TUBAL LIGATION  1991   WISDOM TOOTH EXTRACTION      Social History   Socioeconomic History   Marital status: Married    Spouse name: Not on file   Number of children: 3   Years of education: Not on file   Highest education level: Not on file  Occupational History   Not on file  Tobacco Use   Smoking status: Never   Smokeless tobacco: Never  Vaping Use   Vaping Use: Never used  Substance and Sexual Activity   Alcohol use: No   Drug use: No   Sexual activity: Yes    Birth control/protection: Post-menopausal     Comment: Hysterctomy  Other Topics Concern   Not on file  Social History Narrative   Not on file   Social Determinants of Health   Financial Resource Strain: Not on file  Food Insecurity: Not on file  Transportation Needs: Not on file  Physical Activity: Not on file  Stress: Not on file  Social Connections: Not on file    Family History  Problem Relation Age of Onset   Cancer Mother 72       vulvar cancer   Lung cancer Mother 7   Stroke Father    Cancer - Lung Sister    Suicidality Sister    Suicidality Brother    Arthritis Brother    Prostate cancer Maternal Uncle    Prostate cancer Maternal Uncle    Stroke Maternal Grandmother    Prostate cancer Maternal Grandfather    Cervical cancer Paternal Grandmother    Throat cancer Paternal Grandfather    Colon cancer Neg Hx    Rectal cancer Neg Hx    Stomach cancer Neg Hx     Review of Systems  Constitutional:  Negative for chills and fever.  Eyes:  Negative for visual disturbance.  Respiratory:  Negative for cough, shortness of breath and wheezing.   Cardiovascular:  Negative for chest pain, palpitations and leg swelling.  Gastrointestinal:  Negative for abdominal pain, blood in stool, constipation, diarrhea and nausea.  Genitourinary:  Negative for dysuria and hematuria.  Musculoskeletal:  Positive for arthralgias (diffuse). Negative for back pain.       Right hip/leg pain - radiculopathy  Skin:  Negative for color change and rash.  Neurological:  Positive for headaches. Negative for dizziness, light-headedness and numbness.  Psychiatric/Behavioral:  Positive for dysphoric mood. The patient is nervous/anxious.       Objective:   Vitals:   02/28/21 1004  BP: 120/84  Pulse: 76  Temp: 98.5 F (36.9 C)  SpO2: 98%   Filed Weights   02/28/21 1004  Weight: 192 lb (87.1 kg)   Body mass index is 35.12 kg/m.  BP Readings from Last 3 Encounters:  02/28/21 120/84  10/19/20 (!) 143/84  09/15/20 (!) 150/90     Wt Readings from Last 3 Encounters:  02/28/21 192 lb (87.1 kg)  09/15/20 194 lb (88 kg)  09/02/20 193 lb (87.5 kg)    Depression screen [facility] 2/9 02/28/2021 04/03/2019 04/11/2017  Decreased Interest 0 0 0  Down, Depressed, Hopeless 1 0 0  PHQ - 2 Score 1 0 0  Altered sleeping 2 - -  Tired, decreased energy 1 - -  Change in appetite 0 - -  Feeling bad or failure about yourself  0 - -  Trouble concentrating 0 - -  Moving slowly or fidgety/restless 1 - -  Suicidal thoughts 0 - -  PHQ-9 Score 5 - -  Difficult doing work/chores Not difficult at all - -  Some recent data might be hidden    GAD 7 : Generalized Anxiety Score 02/28/2021  Nervous, Anxious, on Edge 2  Control/stop worrying 1  Worry too much - different things 2  Trouble relaxing 1  Restless 1  Easily annoyed or irritable 2  Afraid - awful might happen 1  Total GAD 7 Score 10  Anxiety Difficulty Not difficult at all       Physical Exam Constitutional: She appears well-developed and well-nourished. No distress.  HENT:  Head: Normocephalic and atraumatic.  Right Ear: External ear normal. Normal ear canal and TM Left Ear: External ear normal.  Normal ear canal and TM Mouth/Throat: Oropharynx is clear and moist.  Eyes: Conjunctivae and EOM are normal.  Neck: Neck supple. No tracheal deviation present. No thyromegaly present.  No carotid bruit  Cardiovascular: Normal rate, regular rhythm and normal heart sounds.   No murmur heard.  No edema. Pulmonary/Chest: Effort normal and breath sounds normal. No respiratory distress. She has no wheezes. She has no rales.  Breast: deferred   Abdominal: Soft. She exhibits no distension. There is no tenderness.  Lymphadenopathy: She has no cervical adenopathy.  Skin: Skin is warm and dry. She is not diaphoretic.  Psychiatric: She has a normal mood and affect. Her behavior is normal.     Lab Results  Component Value Date   WBC 7.9 09/15/2020   HGB 14.6 09/15/2020   HCT  43.2 09/15/2020   PLT 271.0 09/15/2020   GLUCOSE 130 (H) 09/15/2020   ALT 38 (H) 09/15/2020   AST 42 (H) 09/15/2020   NA 137 09/15/2020   K 4.5 09/15/2020   CL 99 09/15/2020   CREATININE 1.00 09/15/2020   BUN 22 09/15/2020   CO2 30 09/15/2020   TSH 1.23 08/12/2019   HGBA1C 5.9 04/03/2019         Assessment & Plan:   Physical exam: Screening blood work  ordered Exercise  active, no regular exercise-encouraged regular exercise Weight  encouraged weight loss Substance abuse  none   Reviewed recommended immunizations.   Health Maintenance  Topic Date Due   COVID-19 Vaccine (1) Never done   Zoster Vaccines- Shingrix (1 of 2) Never done   INFLUENZA VACCINE  01/30/2021   TETANUS/TDAP  02/28/2022 (Originally 04/09/2018)   MAMMOGRAM  12/24/2021   COLONOSCOPY (Pts 45-74yr Insurance coverage will need to be confirmed)  06/08/2027   Hepatitis C Screening  Completed   HIV Screening  Completed   HPV VACCINES  Aged Out   Pneumococcal Vaccine [id number] old  Discontinued          See Problem List for Assessment and Plan of chronic medical problems.

## 2021-02-28 ENCOUNTER — Ambulatory Visit (INDEPENDENT_AMBULATORY_CARE_PROVIDER_SITE_OTHER): Payer: BC Managed Care – PPO | Admitting: Internal Medicine

## 2021-02-28 ENCOUNTER — Other Ambulatory Visit: Payer: Self-pay

## 2021-02-28 ENCOUNTER — Encounter: Payer: Self-pay | Admitting: Internal Medicine

## 2021-02-28 VITALS — BP 120/84 | HR 76 | Temp 98.5°F | Ht 62.0 in | Wt 192.0 lb

## 2021-02-28 DIAGNOSIS — Z0000 Encounter for general adult medical examination without abnormal findings: Secondary | ICD-10-CM | POA: Diagnosis not present

## 2021-02-28 DIAGNOSIS — G620 Drug-induced polyneuropathy: Secondary | ICD-10-CM

## 2021-02-28 DIAGNOSIS — I10 Essential (primary) hypertension: Secondary | ICD-10-CM

## 2021-02-28 DIAGNOSIS — E559 Vitamin D deficiency, unspecified: Secondary | ICD-10-CM | POA: Insufficient documentation

## 2021-02-28 DIAGNOSIS — K219 Gastro-esophageal reflux disease without esophagitis: Secondary | ICD-10-CM

## 2021-02-28 DIAGNOSIS — R7303 Prediabetes: Secondary | ICD-10-CM | POA: Diagnosis not present

## 2021-02-28 DIAGNOSIS — F419 Anxiety disorder, unspecified: Secondary | ICD-10-CM

## 2021-02-28 DIAGNOSIS — N838 Other noninflammatory disorders of ovary, fallopian tube and broad ligament: Secondary | ICD-10-CM | POA: Diagnosis not present

## 2021-02-28 DIAGNOSIS — F32A Depression, unspecified: Secondary | ICD-10-CM | POA: Insufficient documentation

## 2021-02-28 LAB — CBC WITH DIFFERENTIAL/PLATELET
Basophils Absolute: 0 10*3/uL (ref 0.0–0.1)
Basophils Relative: 1.1 % (ref 0.0–3.0)
Eosinophils Absolute: 0.1 10*3/uL (ref 0.0–0.7)
Eosinophils Relative: 3.4 % (ref 0.0–5.0)
HCT: 42.5 % (ref 36.0–46.0)
Hemoglobin: 14.3 g/dL (ref 12.0–15.0)
Lymphocytes Relative: 25.2 % (ref 12.0–46.0)
Lymphs Abs: 0.9 10*3/uL (ref 0.7–4.0)
MCHC: 33.5 g/dL (ref 30.0–36.0)
MCV: 90.5 fl (ref 78.0–100.0)
Monocytes Absolute: 0.3 10*3/uL (ref 0.1–1.0)
Monocytes Relative: 8.9 % (ref 3.0–12.0)
Neutro Abs: 2.3 10*3/uL (ref 1.4–7.7)
Neutrophils Relative %: 61.4 % (ref 43.0–77.0)
Platelets: 226 10*3/uL (ref 150.0–400.0)
RBC: 4.7 Mil/uL (ref 3.87–5.11)
RDW: 13.5 % (ref 11.5–15.5)
WBC: 3.7 10*3/uL — ABNORMAL LOW (ref 4.0–10.5)

## 2021-02-28 LAB — COMPREHENSIVE METABOLIC PANEL
ALT: 22 U/L (ref 0–35)
AST: 23 U/L (ref 0–37)
Albumin: 4.1 g/dL (ref 3.5–5.2)
Alkaline Phosphatase: 85 U/L (ref 39–117)
BUN: 18 mg/dL (ref 6–23)
CO2: 27 mEq/L (ref 19–32)
Calcium: 9.5 mg/dL (ref 8.4–10.5)
Chloride: 103 mEq/L (ref 96–112)
Creatinine, Ser: 1.05 mg/dL (ref 0.40–1.20)
GFR: 58.59 mL/min — ABNORMAL LOW (ref >60.00)
Glucose, Bld: 87 mg/dL (ref 70–99)
Potassium: 4.4 mEq/L (ref 3.5–5.1)
Sodium: 138 mEq/L (ref 135–145)
Total Bilirubin: 0.4 mg/dL (ref 0.2–1.2)
Total Protein: 7.1 g/dL (ref 6.0–8.3)

## 2021-02-28 LAB — LIPID PANEL
Cholesterol: 166 mg/dL (ref 0–200)
HDL: 57.5 mg/dL (ref >39.00)
LDL Cholesterol: 93 mg/dL (ref 0–99)
NonHDL: 108.74
Total CHOL/HDL Ratio: 3
Triglycerides: 77 mg/dL (ref 0.0–149.0)
VLDL: 15.4 mg/dL (ref 0.0–40.0)

## 2021-02-28 LAB — HEMOGLOBIN A1C: Hgb A1c MFr Bld: 6 % (ref 4.6–6.5)

## 2021-02-28 LAB — VITAMIN D 25 HYDROXY (VIT D DEFICIENCY, FRACTURES): VITD: 14.34 ng/mL — ABNORMAL LOW (ref 30.00–100.00)

## 2021-02-28 MED ORDER — AMLODIPINE BESYLATE 5 MG PO TABS: 5.0000 mg | ORAL_TABLET | Freq: Every day | ORAL | 1 refills | Status: DC

## 2021-02-28 MED ORDER — LOSARTAN POTASSIUM 50 MG PO TABS: 50.0000 mg | ORAL_TABLET | Freq: Every day | ORAL | 1 refills | Status: DC

## 2021-02-28 MED ORDER — DULOXETINE HCL 30 MG PO CPEP: 30.0000 mg | ORAL_CAPSULE | Freq: Every day | ORAL | 5 refills | Status: DC

## 2021-02-28 MED ORDER — OMEPRAZOLE 20 MG PO CPDR: 20.0000 mg | DELAYED_RELEASE_CAPSULE | Freq: Every day | ORAL | 3 refills | Status: DC

## 2021-02-28 MED ORDER — DULOXETINE HCL 30 MG PO CPEP
30.0000 mg | ORAL_CAPSULE | Freq: Every day | ORAL | 1 refills | Status: DC
Start: 2021-02-28 — End: 2021-08-30

## 2021-02-28 MED ORDER — AMLODIPINE BESYLATE 5 MG PO TABS: 5.0000 mg | ORAL_TABLET | Freq: Every day | ORAL | 0 refills | Status: DC

## 2021-02-28 NOTE — Assessment & Plan Note (Signed)
Chronic Check a1c Low sugar / carb diet Stressed regular exercise  

## 2021-02-28 NOTE — Assessment & Plan Note (Signed)
Chronic BP well controlled Continue amlodipine 5 mg daily, losartan 50 mg daily cmp

## 2021-02-28 NOTE — Assessment & Plan Note (Signed)
Chronic Taking vitamin D daily Check vitamin D level  

## 2021-02-28 NOTE — Assessment & Plan Note (Signed)
Chronic GERD controlled Continue omeprazole 20 mg daily  

## 2021-02-28 NOTE — Assessment & Plan Note (Signed)
New PHQ-9 and GAD-7 screening performed-according to score she has mild depression She admits she does have some anxiety and depression This is likely what is affecting her sleep He is also experiencing radiculopathy and currently taking gabapentin Will start Cymbalta 30 mg daily-hopefully this will help with some of her anxiety, depression and pain Discussed possible side effects

## 2021-04-13 ENCOUNTER — Encounter: Payer: Self-pay | Admitting: Family Medicine

## 2021-04-13 ENCOUNTER — Encounter: Payer: Self-pay | Admitting: Internal Medicine

## 2021-04-14 ENCOUNTER — Ambulatory Visit (INDEPENDENT_AMBULATORY_CARE_PROVIDER_SITE_OTHER): Payer: BC Managed Care – PPO

## 2021-04-14 ENCOUNTER — Other Ambulatory Visit: Payer: Self-pay

## 2021-04-14 ENCOUNTER — Ambulatory Visit: Payer: Self-pay

## 2021-04-14 ENCOUNTER — Ambulatory Visit (INDEPENDENT_AMBULATORY_CARE_PROVIDER_SITE_OTHER): Payer: BC Managed Care – PPO | Admitting: Sports Medicine

## 2021-04-14 VITALS — BP 134/82 | HR 96 | Ht 62.0 in | Wt 192.0 lb

## 2021-04-14 DIAGNOSIS — M7989 Other specified soft tissue disorders: Secondary | ICD-10-CM

## 2021-04-14 DIAGNOSIS — M84462D Pathological fracture, left tibia, subsequent encounter for fracture with routine healing: Secondary | ICD-10-CM

## 2021-04-14 DIAGNOSIS — M84469D Pathological fracture, unspecified tibia and fibula, subsequent encounter for fracture with routine healing: Secondary | ICD-10-CM | POA: Diagnosis not present

## 2021-04-14 DIAGNOSIS — M25562 Pain in left knee: Secondary | ICD-10-CM | POA: Diagnosis not present

## 2021-04-14 NOTE — Patient Instructions (Addendum)
Good to see you  Weight bearing as tolerated RICE therapy Continue meloxican 15mg  daily can try and split them and do 7.5 and 7.5 to see if it works  See me again in 2 weeks

## 2021-04-14 NOTE — Progress Notes (Signed)
Alyssa [name] D.[place] Phone: [phone]   Assessment and Plan:     1. Left leg swelling 2. Insufficiency fracture of tibia with routine healing, subsequent encounter -Acute, uncertain prognosis, initial sports medicine visit - New onset of intra-articular left knee swelling with recent history of insufficiency fracture of medial tibia as seen on MRI in 09/2020 - Offered aspiration to decrease intra-articular pressure, but informed patient that we could not inject corticosteroid due to history of insufficiency fracture.  Patient elected to not proceed with aspiration at today's visit - Continue meloxicam 15 mg daily.  Patient feels that medication wears off around midday, so she may trial taking 7.5 mg twice daily to see if this spreads out the effectiveness - RICE therapy - Weightbearing as tolerated - X-ray obtained in clinic.  My interpretation: Linear cortical irregularity seen in medial, proximal tibia that would coincide with insufficiency fracture line, however this line does not seem to have significantly changed or worsened since previous imaging.  No other significant cortical irregularities.  Joint space maintained. [place] LIMITED JOINT SPACE STRUCTURES LOW LEFT(NO LINKED CHARGES); Future - DG Knee AP/LAT W/[place] Left; Future    Pertinent previous records reviewed include MRI knee in 09/2020, telephone encounters from 10/13 with PCP, lab work including [place] from 02/28/2021   Follow Up: In 2 weeks for reevaluation.  If no improvement or worsening of symptoms, would consider repeat MRI to further evaluate   Subjective:   I, Alyssa [name], am serving as a scribe for Dr. Glennon [name]  Chief Complaint: Left leg pain and swelling  HPI:   04/14/21 Patient is a 60 year old female presenting with left leg pain and swelling. Patient locates the pain and swelling to above the L knee radiating to the L  foot and the pain runs down the medial L leg. Patient states that this started Sunday. Patient had inventory at work last week so she is up and down on the step stool, but she does this every months so not sure if this was the reason. Patient describes pain as very tight in calf area and tender on the medial and lateral L leg. Patient states the knee feels like it is just not aligned right. Patient using ice, and on meloxicam. The ankle is not as swollen as last night but the rest of the leg and knee is pretty swollen.   Relevant Historical Information: History of ovarian cancer currently in remission with history of chemotherapy.  Additional pertinent review of systems negative.   Current Outpatient Medications:    amLODipine (NORVASC) 5 MG tablet, Take 1 tablet (5 mg total) by mouth daily., Disp: 90 tablet, Rfl: 1   Diclofenac Sodium 2 % SOLN, Place 2 g onto the skin 2 (two) times daily., Disp: 112 g, Rfl: 3   diphenhydrAMINE (BENADRYL) 25 MG tablet, Take 50 mg at bedtime by mouth., Disp: , Rfl:    DULoxetine (CYMBALTA) 30 MG capsule, Take 1 capsule (30 mg total) by mouth daily., Disp: 90 capsule, Rfl: 1   gabapentin (NEURONTIN) 300 MG capsule, Take 1 capsule by mouth at bedtime, Disp: 30 capsule, Rfl: 0   losartan (COZAAR) 50 MG tablet, Take 1 tablet (50 mg total) by mouth daily., Disp: 90 tablet, Rfl: 1   meloxicam (MOBIC) 15 MG tablet, Take 1 tablet (15 mg total) by mouth daily., Disp: 90 tablet, Rfl: 3   omeprazole (PRILOSEC) 20 MG capsule, Take 1  capsule (20 mg total) by mouth daily., Disp: 90 capsule, Rfl: 3   ondansetron (ZOFRAN ODT) 4 MG disintegrating tablet, Take 1 tablet (4 mg total) by mouth every 8 (eight) hours as needed for nausea or vomiting., Disp: 20 tablet, Rfl: 0   tiZANidine (ZANAFLEX) 4 MG tablet, Take 1 tablet (4 mg total) by mouth at bedtime., Disp: 90 tablet, Rfl: 3   Objective:     Vitals:   04/14/21 1310  BP: 134/82  Pulse: 96  SpO2: 96%  Weight: 192 lb (87.1  kg)  Height: 5\' 2"  (1.575 m)      Body mass index is 35.12 kg/m.    Physical Exam:    General:  awake, alert oriented, no acute distress nontoxic Skin: no suspicious lesions or rashes Neuro:sensation intact, no deficits, strength 5/5 with no deficits, no atrophy, normal muscle tone Psych: No signs of anxiety, depression or other mood disorder  Left knee: Moderate swelling No deformity Positive fluid wave, joint milking ROM Flex 90, Ext 10 TTP over medial joint line, medial femoral condyle NTTP over the quad tendon, lat fem condyle, patella, plica, patella tendon, tibial tuberostiy, fibular head, posterior fossa, pes anserine bursa, gerdy's tubercle, lateral jt line Neg anterior and posterior drawer Neg lachman Neg sag sign Negative varus stress Negative valgus stress Negative [place]  Gait slowed with short steps favoring right leg.  Sports Medicine: Musculoskeletal Ultrasound. Exam: Limited US of left knee Diagnosis: Left knee effusion and pain  US Findings: Moderate intra-articular effusion seen from suprapatellar view.  Effusion seen from medial view.  No cortical irregularity seen  US Impression:  Left knee intra-articular effusion   Electronically signed by:  Alyssa [name] D.[facility] 1:52 PM 04/14/21

## 2021-04-16 NOTE — Progress Notes (Deleted)
Subjective:    Patient ID: Alyssa [name], female    DOB: 10-20-1960, 60 y.o.   MRN: [id number]  This visit occurred during the SARS-CoV-2 public health emergency.  Safety protocols were in place, including screening questions prior to the visit, additional usage of staff PPE, and extensive cleaning of exam room while observing appropriate contact time as indicated for disinfecting solutions.    HPI The patient is here for an acute visit.   Knee swelling -   Has seen dr Tamala Julian for left knee pain - has had injections.  Had MRI 4/22   Medications and allergies reviewed with patient and updated if appropriate.  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   Greater trochanteric bursitis of right hip 04/28/2018   Chronic low back pain 04/10/2018   Left shoulder pain 04/10/2018   Palpitations 10/11/2017   Hand arthritis 10/11/2017   Midline low back pain without sciatica 10/11/2017   Genetic testing 12/27/2016   Prediabetes 10/04/2016   Right hip pain 06/19/2016   Family history of prostate cancer 12/01/2015   Esophageal reflux 09/15/2015   Obesity (BMI 30-39.9) 06/03/2015   Essential hypertension 11/11/2014   Chemotherapy-induced peripheral neuropathy ([place]) 10/18/2014   Vasomotor instability 10/05/2014   Carcinoma of left ovary ([place]) 08/24/2014   Surgical menopause 08/24/2014   Ovarian cancer ([place]) 08/02/2014    Current Outpatient Medications on File Prior to Visit  Medication Sig Dispense Refill   amLODipine (NORVASC) 5 MG tablet Take 1 tablet (5 mg total) by mouth daily. 90 tablet 1   Diclofenac Sodium 2 % SOLN Place 2 g onto the skin 2 (two) times daily. 112 g 3   diphenhydrAMINE (BENADRYL) 25 MG tablet Take 50 mg at bedtime by mouth.     DULoxetine (CYMBALTA) 30 MG capsule  Take 1 capsule (30 mg total) by mouth daily. 90 capsule 1   gabapentin (NEURONTIN) 300 MG capsule Take 1 capsule by mouth at bedtime 30 capsule 0   losartan (COZAAR) 50 MG tablet Take 1 tablet (50 mg total) by mouth daily. 90 tablet 1   meloxicam (MOBIC) 15 MG tablet Take 1 tablet (15 mg total) by mouth daily. 90 tablet 3   omeprazole (PRILOSEC) 20 MG capsule Take 1 capsule (20 mg total) by mouth daily. 90 capsule 3   ondansetron (ZOFRAN ODT) 4 MG disintegrating tablet Take 1 tablet (4 mg total) by mouth every 8 (eight) hours as needed for nausea or vomiting. 20 tablet 0   tiZANidine (ZANAFLEX) 4 MG tablet Take 1 tablet (4 mg total) by mouth at bedtime. 90 tablet 3   No current facility-administered medications on file prior to visit.    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   SVD (spontaneous vaginal delivery)    x 3    Past Surgical History:  Procedure Laterality Date   ABDOMINAL HYSTERECTOMY     APPENDECTOMY     with hysterectomy   ENDOMETRIAL ABLATION  2002   LAPAROSCOPIC CHOLECYSTECTOMY  12/09/2016   TUBAL LIGATION  1991   WISDOM TOOTH EXTRACTION      Social History   Socioeconomic History  Marital status: Married    Spouse name: Not on file   Number of children: 3   Years of education: Not on file   Highest education level: Not on file  Occupational History   Not on file  Tobacco Use   Smoking status: Never   Smokeless tobacco: Never  Vaping Use   Vaping Use: Never used  Substance and Sexual Activity   Alcohol use: No   Drug use: No   Sexual activity: Yes    Birth control/protection: Post-menopausal    Comment: Hysterctomy  Other Topics Concern   Not on file  Social History Narrative   Not on file   Social Determinants of Health   Financial Resource Strain: Not on file  Food Insecurity: Not on file  Transportation  Needs: Not on file  Physical Activity: Not on file  Stress: Not on file  Social Connections: Not on file    Family History  Problem Relation Age of Onset   Cancer Mother 36       vulvar cancer   Lung cancer Mother 84   Stroke Father    Cancer - Lung Sister    Suicidality Sister    Suicidality Brother    Arthritis Brother    Prostate cancer Maternal Uncle    Prostate cancer Maternal Uncle    Stroke Maternal Grandmother    Prostate cancer Maternal Grandfather    Cervical cancer Paternal Grandmother    Throat cancer Paternal Grandfather    Colon cancer Neg Hx    Rectal cancer Neg Hx    Stomach cancer Neg Hx     Review of Systems     Objective:  There were no vitals filed for this visit. BP Readings from Last 3 Encounters:  04/14/21 134/82  02/28/21 120/84  10/19/20 (!) 143/84   Wt Readings from Last 3 Encounters:  04/14/21 192 lb (87.1 kg)  02/28/21 192 lb (87.1 kg)  09/15/20 194 lb (88 kg)   There is no height or weight on file to calculate BMI.   Physical Exam         Assessment & Plan:    See Problem List for Assessment and Plan of chronic medical problems.

## 2021-04-17 ENCOUNTER — Ambulatory Visit: Payer: BC Managed Care – PPO | Admitting: Internal Medicine

## 2021-04-17 ENCOUNTER — Other Ambulatory Visit: Payer: Self-pay | Admitting: Family Medicine

## 2021-04-17 NOTE — Progress Notes (Signed)
Alyssa [name] D.[place] Phone: [phone]   Assessment and Plan:     1. Acute pain of left knee 2. 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 Effusion of left knee -Chronic with exacerbation, worsening, subsequent visit -Continued swelling and worsening knee pain with ambulation since visit on 04/14/2021 - Patient elected for aspiration.  Tolerated well per procedure note below.  No medication was injected due to recent history of insufficiency fracture - Continue meloxicam - Partial weightbearing as tolerated.  Provided crutches at today's visit.  Instructed that weightbearing should be pain-free for the next 2 weeks - 2-week work note given as patient should not be excessively ambulating or carrying heavy weights  Pertinent previous records reviewed include none pertinent  Procedure: Ultrasound Guided Knee Joint Injection/Aspiration Side: Left Diagnosis: Left knee pain and effusion [place] Indication:  - accuracy is paramount for diagnosis - to ensure therapeutic efficacy or procedural success - to reduce procedural risk  Risks explained and consent was given verbally. The site was cleaned with Chlorhexidine. The suprapatellar pouch of the knee was identified.  Superficial wheal and numbing track was made using 25-gauge needle and 5 mL 1% lidocaine.  18-gauge needle was introduced to the pouch under ultrasound guidance.  13 mL of clear/straw colored synovial fluid was aspirated. This was well tolerated and resulted in symptomatic relief.  Needle was removed, hemostasis achieved, and post injection instructions were explained.   Pt was advised to call or return to clinic if these symptoms worsen or fail to improve as anticipated.  Follow Up: 2 weeks for reevaluation.  Would consider repeat formal PT +/- MRI at that time to reevaluate insufficiency fracture seen on MRI in  10/03/2020   Subjective:    Chief Complaint: Left knee pain follow up   HPI:   04/14/21 Patient is a 60 year old female presenting with left leg pain and swelling. Patient locates the pain and swelling to above the L knee radiating to the L foot and the pain runs down the medial L leg. Patient states that this started Sunday. Patient had inventory at work last week so she is up and down on the step stool, but she does this every months so not sure if this was the reason. Patient describes pain as very tight in calf area and tender on the medial and lateral L leg. Patient states the knee feels like it is just not aligned right. Patient using ice, and on meloxicam. The ankle is not as swollen as last night but the rest of the leg and knee is pretty swollen.   04/18/21 Patient states that her knee pain is now more constant and she can feel some pain in her L shin and L foot was swollen.   Relevant Historical Information: History of ovarian cancer currently in remission with history of chemotherapy.  Additional pertinent review of systems negative.   Current Outpatient Medications:    amLODipine (NORVASC) 5 MG tablet, Take 1 tablet (5 mg total) by mouth daily., Disp: 90 tablet, Rfl: 1   Diclofenac Sodium 2 % SOLN, Place 2 g onto the skin 2 (two) times daily., Disp: 112 g, Rfl: 3   diphenhydrAMINE (BENADRYL) 25 MG tablet, Take 50 mg at bedtime by mouth., Disp: , Rfl:    DULoxetine (CYMBALTA) 30 MG capsule, Take 1 capsule (30 mg total) by mouth daily., Disp: 90 capsule, Rfl: 1   gabapentin (NEURONTIN)  300 MG capsule, Take 1 capsule by mouth at bedtime, Disp: 30 capsule, Rfl: 0   losartan (COZAAR) 50 MG tablet, Take 1 tablet (50 mg total) by mouth daily., Disp: 90 tablet, Rfl: 1   meloxicam (MOBIC) 15 MG tablet, Take 1 tablet (15 mg total) by mouth daily., Disp: 90 tablet, Rfl: 3   omeprazole (PRILOSEC) 20 MG capsule, Take 1 capsule (20 mg total) by mouth daily., Disp: 90 capsule, Rfl: 3    ondansetron (ZOFRAN ODT) 4 MG disintegrating tablet, Take 1 tablet (4 mg total) by mouth every 8 (eight) hours as needed for nausea or vomiting., Disp: 20 tablet, Rfl: 0   tiZANidine (ZANAFLEX) 4 MG tablet, Take 1 tablet (4 mg total) by mouth at bedtime., Disp: 90 tablet, Rfl: 3   Objective:     Vitals:   04/18/21 1010  BP: (!) 130/92  Pulse: 79  SpO2: 98%  Weight: 192 lb (87.1 kg)  Height: 5\' 2"  (1.575 m)      Body mass index is 35.12 kg/m.    Physical Exam:    Left knee: Moderate swelling No deformity Positive fluid wave, joint milking ROM Flex 90, Ext 10 TTP over medial joint line, medial femoral condyle NTTP over the quad tendon, lat fem condyle, patella, plica, patella tendon, tibial tuberostiy, fibular head, posterior fossa, pes anserine bursa, gerdy's tubercle, lateral jt line Neg anterior and posterior drawer Neg lachman Neg sag sign Negative varus stress Negative valgus stress Negative McMurray   Gait slowed with short steps favoring right leg.   Electronically signed by:  Alyssa [name] D.[name] 10:48 AM 04/18/21

## 2021-04-18 ENCOUNTER — Ambulatory Visit (INDEPENDENT_AMBULATORY_CARE_PROVIDER_SITE_OTHER): Payer: BC Managed Care – PPO | Admitting: Sports Medicine

## 2021-04-18 ENCOUNTER — Other Ambulatory Visit: Payer: Self-pay

## 2021-04-18 ENCOUNTER — Ambulatory Visit: Payer: Self-pay

## 2021-04-18 VITALS — BP 130/92 | HR 79 | Ht 62.0 in | Wt 192.0 lb

## 2021-04-18 DIAGNOSIS — M84469D Pathological fracture, unspecified tibia and fibula, subsequent encounter for fracture with routine healing: Secondary | ICD-10-CM | POA: Diagnosis not present

## 2021-04-18 DIAGNOSIS — M25562 Pain in left knee: Secondary | ICD-10-CM

## 2021-04-18 DIAGNOSIS — M84469A Pathological fracture, unspecified tibia and fibula, initial encounter for fracture: Secondary | ICD-10-CM | POA: Diagnosis not present

## 2021-04-18 DIAGNOSIS — M25462 Effusion, left knee: Secondary | ICD-10-CM

## 2021-04-18 NOTE — Patient Instructions (Addendum)
Good to see you  Crutches for partial weight bearing as tolerated Work note given to be out two weeks  Follow up in 2 weeks

## 2021-04-27 NOTE — Progress Notes (Signed)
Alyssa [name] D.[place] Alyssa [name] Phone: [phone]   Assessment and Plan:     1. Insufficiency fracture of tibia with delayed healing, subsequent encounter 2. Acute pain of left knee - Chronic with exacerbation, no improvement, subsequent visit - Patient is experienced no improvement and thinks her knee pain may generally be worse since previous office visit - History of insufficiency fracture seen on MRI 09/2020, with repeat x-ray on 04/14/2021 showing no overt fracture and no dislocation - Based on HPI, physical exam, imaging, I feel that patient's insufficiency fracture and meniscal injuries may be the root cause of her pain - Patient wishes to continue with conservative therapy.  We will be more aggressive with complete nonweightbearing of left lower extremity using crutches for the next 2 weeks and then reevaluate to see if patient has any improvement.  At that time if she is improving, could consider a 4 to 6-week nonweightbearing to partial weightbearing status.  If no improvement at follow-up visit would consider referral to orthopedic surgery to discuss arthroscopic procedure versus total knee replacement - May continue meloxicam as needed for pain control - Continue RICE therapy   Pertinent previous records reviewed include knee mri 09/2020, xray 04/2021   Follow Up: We will be more aggressive with complete nonweightbearing of left lower extremity using crutches for the next 2 weeks and then reevaluate to see if patient has any improvement.  At that time if she is improving, could consider a 4 to 6-week nonweightbearing to partial weightbearing status.  If no improvement at follow-up visit would consider referral to orthopedic surgery to discuss arthroscopic procedure versus total knee replacement     Subjective:   I, Alyssa [name], am serving as a Education administrator for Dr. Glennon Mac.  Chief Complaint: Left knee pain  follow up  HPI:     04/14/21 Patient is a 60 year old female presenting with left leg pain and swelling. Patient locates the pain and swelling to above the L knee radiating to the L foot and the pain runs down the medial L leg. Patient states that this started Sunday. Patient had inventory at work last week so she is up and down on the step stool, but she does this every months so not sure if this was the reason. Patient describes pain as very tight in calf area and tender on the medial and lateral L leg. Patient states the knee feels like it is just not aligned right. Patient using ice, and on meloxicam. The ankle is not as swollen as last night but the rest of the leg and knee is pretty swollen.    04/18/21 Patient states that her knee pain is now more constant and she can feel some pain in her L shin and L foot was swollen.   04/28/21 Patient states still hurting. Have been doing RICE method. Sam pain same location. Thinks that it hurts worse.  Relevant Historical Information: HTN, Hx of ovarian cancer,   Additional pertinent review of systems negative.   Current Outpatient Medications:    amLODipine (NORVASC) 5 MG tablet, Take 1 tablet (5 mg total) by mouth daily., Disp: 90 tablet, Rfl: 1   Diclofenac Sodium 2 % SOLN, Place 2 g onto the skin 2 (two) times daily., Disp: 112 g, Rfl: 3   diphenhydrAMINE (BENADRYL) 25 MG tablet, Take 50 mg at bedtime by mouth., Disp: , Rfl:    DULoxetine (CYMBALTA) 30 MG capsule, Take  1 capsule (30 mg total) by mouth daily., Disp: 90 capsule, Rfl: 1   gabapentin (NEURONTIN) 300 MG capsule, Take 1 capsule by mouth at bedtime, Disp: 30 capsule, Rfl: 0   losartan (COZAAR) 50 MG tablet, Take 1 tablet (50 mg total) by mouth daily., Disp: 90 tablet, Rfl: 1   meloxicam (MOBIC) 15 MG tablet, Take 1 tablet (15 mg total) by mouth daily., Disp: 90 tablet, Rfl: 3   omeprazole (PRILOSEC) 20 MG capsule, Take 1 capsule (20 mg total) by mouth daily., Disp: 90 capsule,  Rfl: 3   ondansetron (ZOFRAN ODT) 4 MG disintegrating tablet, Take 1 tablet (4 mg total) by mouth every 8 (eight) hours as needed for nausea or vomiting., Disp: 20 tablet, Rfl: 0   tiZANidine (ZANAFLEX) 4 MG tablet, Take 1 tablet (4 mg total) by mouth at bedtime., Disp: 90 tablet, Rfl: 3   Objective:     Vitals:   04/28/21 0822  BP: 130/80  Pulse: 78  SpO2: 92%  Weight: 194 lb (88 kg)  Height: 5\' 2"  (1.575 m)      Body mass index is 35.48 kg/m.    Physical Exam:    Left knee: Moderate swelling No deformity Positive fluid wave, joint milking ROM Flex 90, Ext 10 TTP over medial joint line, medial femoral condyle NTTP over the quad tendon, lat fem condyle, patella, plica, patella tendon, tibial tuberostiy, fibular head, posterior fossa, pes anserine bursa, gerdy's tubercle, lateral jt line Neg anterior and posterior drawer Neg lachman Neg sag sign Negative varus stress Negative valgus stress Negative McMurray   Gait slowed with short steps favoring right leg.   Electronically signed by:  Alyssa [name] D.[facility] 9:40 AM 04/28/21

## 2021-04-28 ENCOUNTER — Ambulatory Visit (INDEPENDENT_AMBULATORY_CARE_PROVIDER_SITE_OTHER): Payer: BC Managed Care – PPO | Admitting: Sports Medicine

## 2021-04-28 ENCOUNTER — Other Ambulatory Visit: Payer: Self-pay

## 2021-04-28 VITALS — BP 130/80 | HR 78 | Ht 62.0 in | Wt 194.0 lb

## 2021-04-28 DIAGNOSIS — M84469G Pathological fracture, unspecified tibia and fibula, subsequent encounter for fracture with delayed healing: Secondary | ICD-10-CM | POA: Diagnosis not present

## 2021-04-28 DIAGNOSIS — M25562 Pain in left knee: Secondary | ICD-10-CM

## 2021-04-28 NOTE — Patient Instructions (Addendum)
Non weight bearing for 2 weeks at work Follow up in 2 weeks

## 2021-05-01 ENCOUNTER — Telehealth: Payer: Self-pay | Admitting: Sports Medicine

## 2021-05-01 NOTE — Addendum Note (Signed)
Addended by: Vilma Meckel R on: 05/01/2021 09:30 AM   Modules accepted: Orders

## 2021-05-01 NOTE — Telephone Encounter (Signed)
Patient aware.

## 2021-05-01 NOTE — Telephone Encounter (Signed)
Patient would like to proceed with orthopedic surgeon.  Can we send the referral over to the surgeon?  Patient would like a call back when referral has been sent.

## 2021-05-08 DIAGNOSIS — M25562 Pain in left knee: Secondary | ICD-10-CM | POA: Diagnosis not present

## 2021-05-09 ENCOUNTER — Telehealth: Payer: Self-pay

## 2021-05-09 NOTE — Telephone Encounter (Signed)
LVM for patient informing her that paper work has been filled out but there is a spot for her to sign and was not sure if she wanted to come pick the forms up, have [place] email them, or come in and sign and then we can fax them?

## 2021-05-11 ENCOUNTER — Ambulatory Visit: Payer: BC Managed Care – PPO | Admitting: Sports Medicine

## 2021-05-16 ENCOUNTER — Other Ambulatory Visit: Payer: Self-pay | Admitting: Family Medicine

## 2021-05-17 ENCOUNTER — Other Ambulatory Visit: Payer: Self-pay | Admitting: Orthopaedic Surgery

## 2021-05-19 ENCOUNTER — Other Ambulatory Visit: Payer: Self-pay | Admitting: Family Medicine

## 2021-06-07 NOTE — Patient Instructions (Addendum)
DUE TO COVID-19 ONLY ONE VISITOR IS ALLOWED TO COME WITH YOU AND STAY IN THE WAITING ROOM ONLY DURING PRE OP AND PROCEDURE.   **NO VISITORS ARE ALLOWED IN THE SHORT STAY AREA OR RECOVERY ROOM!!**       Your procedure is scheduled on: 06/20/21   Report to [facility] Main Entrance    Report to admitting at 5:15 AM   Call this number if you have problems the morning of surgery [phone]   Do not eat food :After Midnight.   May have liquids until 4:30 AM day of surgery  CLEAR LIQUID DIET  Foods Allowed                                                                     Foods Excluded  Water, Black Coffee and tea (no milk or creamer)           liquids that you cannot  Plain Jell-O in any flavor  (No red)                                    see through such as: Fruit ices (not with fruit pulp)                                            milk, soups, orange juice              Iced Popsicles (No red)                                                All solid food                                   Apple juices Sports drinks like Gatorade (No red) Lightly seasoned clear broth or consume(fat free) Sugar     The day of surgery:  Drink ONE (1) Pre-Surgery G2 by 4:30 am the morning of surgery. Drink in one sitting. Do not sip.  This drink was given to you during your hospital  pre-op appointment visit. Nothing else to drink after completing the  Pre-Surgery G2.          If you have questions, please contact your surgeon's office.     Oral Hygiene is also important to reduce your risk of infection.                                    Remember - BRUSH YOUR TEETH THE MORNING OF SURGERY WITH YOUR REGULAR TOOTHPASTE   Take these medicines the morning of surgery with A SIP OF WATER: Amlodipine, Omeprazole                You may not have any metal on your body including hair pins, jewelry, and body piercing  Do not wear make-up, lotions, powders, perfumes, or  deodorant  Do not wear nail polish including gel and S&S, artificial/acrylic nails, or any other type of covering on natural nails including finger and toenails. If you have artificial nails, gel coating, etc. that needs to be removed by a nail salon please have this removed prior to surgery or surgery may need to be canceled/ delayed if the surgeon/ anesthesia feels like they are unable to be safely monitored.   Do not shave  48 hours prior to surgery.    Do not bring valuables to the hospital. [place].   Patients discharged on the day of surgery will not be allowed to drive home.              Please read over the following fact sheets you were given: IF YOU HAVE QUESTIONS ABOUT YOUR PRE-OP INSTRUCTIONS PLEASE CALL [facility] - Preparing for Surgery Before surgery, you can play an important role.  Because skin is not sterile, your skin needs to be as free of germs as possible.  You can reduce the number of germs on your skin by washing with CHG (chlorahexidine gluconate) soap before surgery.  CHG is an antiseptic cleaner which kills germs and bonds with the skin to continue killing germs even after washing. Please DO NOT use if you have an allergy to CHG or antibacterial soaps.  If your skin becomes reddened/irritated stop using the CHG and inform your nurse when you arrive at Short Stay. Do not shave (including legs and underarms) for at least 48 hours prior to the first CHG shower.  You may shave your face/neck.  Please follow these instructions carefully:  1.  Shower with CHG Soap the night before surgery and the  morning of surgery.  2.  If you choose to wash your hair, wash your hair first as usual with your normal  shampoo.  3.  After you shampoo, rinse your hair and body thoroughly to remove the shampoo.                             4.  Use CHG as you would any other liquid soap.  You can apply chg directly to the  skin and wash.  Gently with a scrungie or clean washcloth.  5.  Apply the CHG Soap to your body ONLY FROM THE NECK DOWN.   Do   not use on face/ open                           Wound or open sores. Avoid contact with eyes, ears mouth and   genitals (private parts).                       Wash face,  Genitals (private parts) with your normal soap.             6.  Wash thoroughly, paying special attention to the area where your    surgery  will be performed.  7.  Thoroughly rinse your body with warm water from the neck down.  8.  DO NOT shower/wash with your normal soap after using and rinsing off the CHG Soap.  9.  Pat yourself dry with a clean towel.            10.  Wear clean pajamas.            11.  Place clean sheets on your bed the night of your first shower and do not  sleep with pets. Day of Surgery : Do not apply any lotions/deodorants the morning of surgery.  Please wear clean clothes to the hospital/surgery center.  FAILURE TO FOLLOW THESE INSTRUCTIONS MAY RESULT IN THE CANCELLATION OF YOUR SURGERY  PATIENT SIGNATURE_________________________________  NURSE SIGNATURE__________________________________  ________________________________________________________________________   Alyssa [name]  An incentive spirometer is a tool that can help keep your lungs clear and active. This tool measures how well you are filling your lungs with each breath. Taking long deep breaths may help reverse or decrease the chance of developing breathing (pulmonary) problems (especially infection) following: A long period of time when you are unable to move or be active. BEFORE THE PROCEDURE  If the spirometer includes an indicator to show your best effort, your nurse or respiratory therapist will set it to a desired goal. If possible, sit up straight or lean slightly forward. Try not to slouch. Hold the incentive spirometer in an upright position. INSTRUCTIONS FOR USE  Sit on the edge  of your bed if possible, or sit up as far as you can in bed or on a chair. Hold the incentive spirometer in an upright position. Breathe out normally. Place the mouthpiece in your mouth and seal your lips tightly around it. Breathe in slowly and as deeply as possible, raising the piston or the ball toward the top of the column. Hold your breath for 3-5 seconds or for as long as possible. Allow the piston or ball to fall to the bottom of the column. Remove the mouthpiece from your mouth and breathe out normally. Rest for a few seconds and repeat Steps 1 through 7 at least 10 times every 1-2 hours when you are awake. Take your time and take a few normal breaths between deep breaths. The spirometer may include an indicator to show your best effort. Use the indicator as a goal to work toward during each repetition. After each set of 10 deep breaths, practice coughing to be sure your lungs are clear. If you have an incision (the cut made at the time of surgery), support your incision when coughing by placing a pillow or rolled up towels firmly against it. Once you are able to get out of bed, walk around indoors and cough well. You may stop using the incentive spirometer when instructed by your caregiver.  RISKS AND COMPLICATIONS Take your time so you do not get dizzy or light-headed. If you are in pain, you may need to take or ask for pain medication before doing incentive spirometry. It is harder to take a deep breath if you are having pain. AFTER USE Rest and breathe slowly and easily. It can be helpful to keep track of a log of your progress. Your caregiver can provide you with a simple table to help with this. If you are using the spirometer at home, follow these instructions: [place] IF:  You are having difficultly using the spirometer. You have trouble using the spirometer as often as instructed. Your pain medication is not giving enough relief while using the spirometer. You develop  fever of 100.5 F (38.1 C) or higher. SEEK IMMEDIATE MEDICAL CARE IF:  You cough up bloody sputum that  had not been present before. You develop fever of 102 F (38.9 C) or greater. You develop worsening pain at or near the incision site. MAKE SURE YOU:  Understand these instructions. Will watch your condition. Will get help right away if you are not doing well or get worse. Document Released: 10/29/2006 Document Revised: 09/10/2011 Document Reviewed: 12/30/2006 Exit[employer] Patient Information 2014 [employer], [place].   ________________________________________________________________________  WHAT IS A BLOOD TRANSFUSION? Blood Transfusion Information  A transfusion is the replacement of blood or some of its parts. Blood is made up of multiple cells which provide different functions. Red blood cells carry oxygen and are used for blood loss replacement. White blood cells fight against infection. Platelets control bleeding. Plasma helps clot blood. Other blood products are available for specialized needs, such as hemophilia or other clotting disorders. BEFORE THE TRANSFUSION  Who gives blood for transfusions?  Healthy volunteers who are fully evaluated to make sure their blood is safe. This is blood bank blood. Transfusion therapy is the safest it has ever been in the practice of medicine. Before blood is taken from a donor, a complete history is taken to make sure that person has no history of diseases nor engages in risky social behavior (examples are intravenous drug use or sexual activity with multiple partners). The donor's travel history is screened to minimize risk of transmitting infections, such as malaria. The donated blood is tested for signs of infectious diseases, such as HIV and hepatitis. The blood is then tested to be sure it is compatible with you in order to minimize the chance of a transfusion reaction. If you or a relative donates blood, this is often done in anticipation of  surgery and is not appropriate for emergency situations. It takes many days to process the donated blood. RISKS AND COMPLICATIONS Although transfusion therapy is very safe and saves many lives, the main dangers of transfusion include:  Getting an infectious disease. Developing a transfusion reaction. This is an allergic reaction to something in the blood you were given. Every precaution is taken to prevent this. The decision to have a blood transfusion has been considered carefully by your caregiver before blood is given. Blood is not given unless the benefits outweigh the risks. AFTER THE TRANSFUSION Right after receiving a blood transfusion, you will usually feel much better and more energetic. This is especially true if your red blood cells have gotten low (anemic). The transfusion raises the level of the red blood cells which carry oxygen, and this usually causes an energy increase. The nurse administering the transfusion will monitor you carefully for complications. HOME CARE INSTRUCTIONS  No special instructions are needed after a transfusion. You may find your energy is better. Speak with your caregiver about any limitations on activity for underlying diseases you may have. SEEK MEDICAL CARE IF:  Your condition is not improving after your transfusion. You develop redness or irritation at the intravenous (IV) site. SEEK IMMEDIATE MEDICAL CARE IF:  Any of the following symptoms occur over the next 12 hours: Shaking chills. You have a temperature by mouth above 102 F (38.9 C), not controlled by medicine. Chest, back, or muscle pain. People around you feel you are not acting correctly or are confused. Shortness of breath or difficulty breathing. Dizziness and fainting. You get a rash or develop hives. You have a decrease in urine output. Your urine turns a dark color or changes to pink, red, or brown. Any of the following symptoms occur over the next 10 days:  You have a temperature by  mouth above 102 F (38.9 C), not controlled by medicine. Shortness of breath. Weakness after normal activity. The white part of the eye turns yellow (jaundice). You have a decrease in the amount of urine or are urinating less often. Your urine turns a dark color or changes to pink, red, or brown. Document Released: 06/15/2000 Document Revised: 09/10/2011 Document Reviewed: 02/02/2008 [facility] Patient Information 2014 [place], [place].  _______________________________________________________________________

## 2021-06-07 NOTE — Progress Notes (Addendum)
COVID swab appointment: n/a  COVID Vaccine Completed: n/a Date COVID Vaccine completed: Has received booster: COVID vaccine manufacturer: [employer]   Date of COVID positive in last 90 days: no  PCP - Billey Gosling, MD Cardiologist - n/a  Chest x-ray - 06/08/21 Epic EKG - 06/08/21 Epic/chart Stress Test - n/a ECHO - n/a Cardiac Cath - n/a Pacemaker/ICD device last checked: n/a Spinal Cord Stimulator: n/a  Sleep Study - n/a CPAP -   Fasting Blood Sugar - pre Dm, diet controlled doesn't check at home Checks Blood Sugar _____ times a day  Blood Thinner Instructions:n/a Aspirin Instructions: Last Dose:  Activity level: Can go up a flight of stairs and perform activities of daily living without stopping and without symptoms of chest pain or shortness of breath.      Anesthesia review: UA positive for leukocytes, preDM, HTN, palpitations  Patient denies shortness of breath, fever, cough and chest pain at PAT appointment   Patient verbalized understanding of instructions that were given to them at the PAT appointment. Patient was also instructed that they will need to review over the PAT instructions again at home before surgery.

## 2021-06-08 ENCOUNTER — Encounter (HOSPITAL_COMMUNITY): Payer: Self-pay

## 2021-06-08 ENCOUNTER — Other Ambulatory Visit: Payer: Self-pay

## 2021-06-08 ENCOUNTER — Encounter (HOSPITAL_COMMUNITY)
Admission: RE | Admit: 2021-06-08 | Discharge: 2021-06-08 | Disposition: A | Discharge status: Home / Self Care | Payer: BC Managed Care – PPO | Source: Ambulatory Visit | Attending: Orthopaedic Surgery | Admitting: Orthopaedic Surgery

## 2021-06-08 ENCOUNTER — Ambulatory Visit (HOSPITAL_COMMUNITY)
Admission: RE | Admit: 2021-06-08 | Discharge: 2021-06-08 | Disposition: A | Discharge status: Home / Self Care | Payer: BC Managed Care – PPO | Source: Ambulatory Visit | Attending: Orthopaedic Surgery | Admitting: Orthopaedic Surgery

## 2021-06-08 VITALS — BP 144/91 | HR 79 | Temp 98.7°F | Resp 16 | Ht 62.0 in | Wt 186.6 lb

## 2021-06-08 DIAGNOSIS — Z01818 Encounter for other preprocedural examination: Secondary | ICD-10-CM | POA: Insufficient documentation

## 2021-06-08 DIAGNOSIS — R7303 Prediabetes: Secondary | ICD-10-CM | POA: Diagnosis not present

## 2021-06-08 HISTORY — DX: Prediabetes: R73.03

## 2021-06-08 LAB — CBC WITH DIFFERENTIAL/PLATELET
Abs Immature Granulocytes: 0.01 10*3/uL (ref 0.00–0.07)
Basophils Absolute: 0.1 10*3/uL (ref 0.0–0.1)
Basophils Relative: 2 %
Eosinophils Absolute: 0.1 10*3/uL (ref 0.0–0.5)
Eosinophils Relative: 3 %
HCT: 44 % (ref 36.0–46.0)
Hemoglobin: 14.7 g/dL (ref 12.0–15.0)
Immature Granulocytes: 0 %
Lymphocytes Relative: 26 %
Lymphs Abs: 1.1 10*3/uL (ref 0.7–4.0)
MCH: 29.9 pg (ref 26.0–34.0)
MCHC: 33.4 g/dL (ref 30.0–36.0)
MCV: 89.6 fL (ref 80.0–100.0)
Monocytes Absolute: 0.4 10*3/uL (ref 0.1–1.0)
Monocytes Relative: 9 %
Neutro Abs: 2.5 10*3/uL (ref 1.7–7.7)
Neutrophils Relative %: 60 %
Platelets: 273 10*3/uL (ref 150–400)
RBC: 4.91 MIL/uL (ref 3.87–5.11)
RDW: 12.9 % (ref 11.5–15.5)
WBC: 4.1 10*3/uL (ref 4.0–10.5)
nRBC: 0 % (ref 0.0–0.2)

## 2021-06-08 LAB — URINALYSIS, ROUTINE W REFLEX MICROSCOPIC
Bilirubin Urine: NEGATIVE
Glucose, UA: NEGATIVE mg/dL
Hgb urine dipstick: NEGATIVE
Ketones, ur: NEGATIVE mg/dL
Nitrite: NEGATIVE
Protein, ur: NEGATIVE mg/dL
Specific Gravity, Urine: 1.017 (ref 1.005–1.030)
pH: 5 (ref 5.0–8.0)

## 2021-06-08 LAB — TYPE AND SCREEN
ABO/RH(D): A POS
Antibody Screen: NEGATIVE

## 2021-06-08 LAB — BASIC METABOLIC PANEL
Anion gap: 6 (ref 5–15)
BUN: 20 mg/dL (ref 6–20)
CO2: 27 mmol/L (ref 22–32)
Calcium: 9.6 mg/dL (ref 8.9–10.3)
Chloride: 105 mmol/L (ref 98–111)
Creatinine, Ser: 0.91 mg/dL (ref 0.44–1.00)
GFR, Estimated: >60 mL/min (ref >60)
Glucose, Bld: 99 mg/dL (ref 70–99)
Potassium: 4.3 mmol/L (ref 3.5–5.1)
Sodium: 138 mmol/L (ref 135–145)

## 2021-06-08 LAB — HEMOGLOBIN A1C
Hgb A1c MFr Bld: 5.8 % — ABNORMAL HIGH (ref 4.8–5.6)
Mean Plasma Glucose: 119.76 mg/dL

## 2021-06-08 LAB — PROTIME-INR
INR: 1 (ref 0.8–1.2)
Prothrombin Time: 12.7 seconds (ref 11.4–15.2)

## 2021-06-08 LAB — SURGICAL PCR SCREEN
MRSA, PCR: NEGATIVE
Staphylococcus aureus: NEGATIVE

## 2021-06-08 LAB — APTT: aPTT: 24 seconds (ref 24–36)

## 2021-06-08 LAB — GLUCOSE, CAPILLARY: Glucose-Capillary: 88 mg/dL (ref 70–99)

## 2021-06-08 NOTE — Progress Notes (Signed)
UA positive for leukocytes. Results routed to Dr. Rhona Raider

## 2021-06-09 ENCOUNTER — Telehealth: Payer: Self-pay | Admitting: Internal Medicine

## 2021-06-09 ENCOUNTER — Telehealth: Payer: Self-pay

## 2021-06-09 DIAGNOSIS — R9389 Abnormal findings on diagnostic imaging of other specified body structures: Secondary | ICD-10-CM

## 2021-06-09 NOTE — Telephone Encounter (Signed)
Please let her know that the orthopedic called [place] about her chest x-ray, which did show something abnormal.  I do want her to have a CT of her lungs and I have ordered that.  Someone will call her to schedule this.  Please let me know if she has any questions or concerns.

## 2021-06-09 NOTE — Telephone Encounter (Signed)
Alyssa [name] with [facility] has stated pts Chest x-ray came back with abnormal findings and just wanted PCP to be aware of the findings and to also please inform pt of next steps.

## 2021-06-12 ENCOUNTER — Encounter: Payer: Self-pay | Admitting: Internal Medicine

## 2021-06-12 ENCOUNTER — Other Ambulatory Visit: Payer: Self-pay

## 2021-06-12 DIAGNOSIS — R300 Dysuria: Secondary | ICD-10-CM

## 2021-06-12 NOTE — Telephone Encounter (Signed)
Can she give another sample - the UA was suggestive of a UTI, but may not have one.   Would prefer to check urine culture.  She should increase her fluids.

## 2021-06-12 NOTE — Telephone Encounter (Signed)
Spoke with patient today.  Culture put in and patient will come by sometime tomorrow to submit culture.

## 2021-06-13 ENCOUNTER — Encounter: Payer: Self-pay | Admitting: Internal Medicine

## 2021-06-14 ENCOUNTER — Telehealth: Payer: Self-pay | Admitting: Internal Medicine

## 2021-06-14 NOTE — Telephone Encounter (Signed)
Baxter Flattery from [employer] has called and states that pt needs to have a CT scan completed. States that our office is not in their network, and would like to have a call back to discuss alternative sites to have it completed. Please advise.    Callback #- [id number]   Tracking #- [id number]

## 2021-06-15 ENCOUNTER — Other Ambulatory Visit: Payer: Self-pay | Admitting: Internal Medicine

## 2021-06-15 DIAGNOSIS — N838 Other noninflammatory disorders of ovary, fallopian tube and broad ligament: Secondary | ICD-10-CM

## 2021-06-16 NOTE — Telephone Encounter (Signed)
switched

## 2021-06-16 NOTE — Addendum Note (Signed)
Addended by: Binnie Rail on: 06/16/2021 04:44 PM   Modules accepted: Orders

## 2021-06-19 ENCOUNTER — Other Ambulatory Visit: Payer: Self-pay

## 2021-06-19 ENCOUNTER — Ambulatory Visit
Admission: RE | Admit: 2021-06-19 | Discharge: 2021-06-19 | Disposition: A | Discharge status: Home / Self Care | Payer: BC Managed Care – PPO | Source: Ambulatory Visit | Attending: Internal Medicine | Admitting: Internal Medicine

## 2021-06-19 DIAGNOSIS — K760 Fatty (change of) liver, not elsewhere classified: Secondary | ICD-10-CM | POA: Diagnosis not present

## 2021-06-19 DIAGNOSIS — R9389 Abnormal findings on diagnostic imaging of other specified body structures: Secondary | ICD-10-CM

## 2021-06-19 DIAGNOSIS — K449 Diaphragmatic hernia without obstruction or gangrene: Secondary | ICD-10-CM | POA: Diagnosis not present

## 2021-06-19 DIAGNOSIS — R918 Other nonspecific abnormal finding of lung field: Secondary | ICD-10-CM | POA: Diagnosis not present

## 2021-06-19 DIAGNOSIS — E034 Atrophy of thyroid (acquired): Secondary | ICD-10-CM | POA: Diagnosis not present

## 2021-06-19 MED ORDER — IOPAMIDOL (ISOVUE-300) INJECTION 61%
75.0000 mL | Freq: Once | INTRAVENOUS | Status: AC | PRN
  Administered 2021-06-19: 15:00:00 75 mL via INTRAVENOUS

## 2021-06-19 NOTE — Anesthesia Preprocedure Evaluation (Addendum)
Anesthesia Evaluation  Patient identified by MRN, date of birth, ID band Patient awake    Reviewed: Allergy & Precautions, NPO status , Patient's Chart, lab work & pertinent test results  Airway Mallampati: II  TM Distance: >3 FB Neck ROM: Full    Dental no notable dental hx. (+) Teeth Intact, Dental Advisory Given, Missing,    Pulmonary neg pulmonary ROS,    Pulmonary exam normal breath sounds clear to auscultation       Cardiovascular hypertension (146/87 in preop), Pt. on medications Normal cardiovascular exam Rhythm:Regular Rate:Normal     Neuro/Psych PSYCHIATRIC DISORDERS Anxiety Depression negative neurological ROS     GI/Hepatic Neg liver ROS, GERD  Medicated and Controlled,  Endo/Other  negative endocrine ROS  Renal/GU negative Renal ROS   Hx ovarian ca 2016    Musculoskeletal  (+) Arthritis , Osteoarthritis,    Abdominal (+) + obese,   Peds  Hematology negative hematology ROS (+) hct 44, plt 273   Anesthesia Other Findings   Reproductive/Obstetrics negative OB ROS                            Anesthesia Physical Anesthesia Plan  ASA: 2  Anesthesia Plan: Spinal, Regional and MAC   Post-op Pain Management: Tylenol PO (pre-op) and Toradol IV (intra-op)   Induction:   PONV Risk Score and Plan: 2 and Propofol infusion, TIVA and Midazolam  Airway Management Planned: Natural Airway and Nasal Cannula  Additional Equipment: None  Intra-op Plan:   Post-operative Plan:   Informed Consent: I have reviewed the patients History and Physical, chart, labs and discussed the procedure including the risks, benefits and alternatives for the proposed anesthesia with the patient or authorized representative who has indicated his/her understanding and acceptance.     Dental advisory given  Plan Discussed with: CRNA  Anesthesia Plan Comments:        Anesthesia Quick  Evaluation

## 2021-06-19 NOTE — H&P (Signed)
TOTAL KNEE ADMISSION H&P  Patient is being admitted for left total knee arthroplasty.  Subjective:  Chief Complaint:left knee pain.  HPI: Alyssa [name], 60 y.o. female, has a history of pain and functional disability in the left knee due to arthritis and has failed non-surgical conservative treatments for greater than 12 weeks to includeNSAID's and/or analgesics, flexibility and strengthening excercises, supervised PT with diminished ADL's post treatment, use of assistive devices, weight reduction as appropriate, and activity modification.  Onset of symptoms was gradual, starting 5 years ago with gradually worsening course since that time. The patient noted no past surgery on the left knee(s).  Patient currently rates pain in the left knee(s) at 10 out of 10 with activity. Patient has night pain, worsening of pain with activity and weight bearing, pain that interferes with activities of daily living, crepitus, and joint swelling.  Patient has evidence of subchondral cysts, subchondral sclerosis, periarticular osteophytes, and joint space narrowing by imaging studies. There is no active infection.  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   Greater trochanteric bursitis of right hip 04/28/2018   Chronic low back pain 04/10/2018   Left shoulder pain 04/10/2018   Palpitations 10/11/2017   Hand arthritis 10/11/2017   Midline low back pain without sciatica 10/11/2017   Genetic testing 12/27/2016   Prediabetes 10/04/2016   Right hip pain 06/19/2016   Family history of prostate cancer 12/01/2015   Esophageal reflux 09/15/2015   Obesity (BMI 30-39.9) 06/03/2015   Essential hypertension 11/11/2014   Chemotherapy-induced peripheral neuropathy ([place]) 10/18/2014   Vasomotor instability 10/05/2014    Carcinoma of left ovary ([place]) 08/24/2014   Surgical menopause 08/24/2014   Ovarian cancer ([place]) 08/02/2014   Past Medical History:  Diagnosis Date   Arthritis    hands   GERD (gastroesophageal reflux disease)    Hypertension    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   Current Outpatient Medications  Medication Sig Dispense Refill Last Dose   amLODipine (NORVASC) 5 MG tablet Take 1 tablet (5 mg total) by mouth daily. 90 tablet 1    Cholecalciferol (VITAMIN D3 PO) Take 5,000 Units by mouth every evening.      diphenhydrAMINE (BENADRYL) 25 MG tablet Take 50 mg by mouth at bedtime as needed for sleep.      DULoxetine (CYMBALTA) 30 MG capsule Take 1 capsule (30 mg total) by mouth daily. (Patient taking differently: Take 30 mg by mouth every evening.) 90 capsule 1    gabapentin (NEURONTIN) 300 MG capsule Take 1 capsule by mouth at bedtime 30 capsule 0    losartan (COZAAR) 50 MG tablet Take 1 tablet (50 mg total) by mouth daily. 90 tablet 1    meloxicam (MOBIC) 15 MG tablet Take 1 tablet (15 mg total) by mouth daily. 90 tablet 3    tiZANidine (ZANAFLEX) 4 MG tablet TAKE  1 TABLET BY MOUTH AT BEDTIME 90 tablet 0    omeprazole (PRILOSEC) 20 MG capsule TAKE 1 CAPSULE BY MOUTH  DAILY 90 capsule 3    Allergies  Allergen Reactions   Codeine Itching   Doxycycline Other (See Comments)    chest pain   Penicillins Nausea And Vomiting   Prednisone Anxiety    severe anxiety    Social History   Tobacco Use   Smoking status: Never   Smokeless tobacco: Never  Substance Use Topics   Alcohol use: No     Family History  Problem Relation Age of Onset   Cancer Mother 32       vulvar cancer   Lung cancer Mother 68   Stroke Father    Cancer - Lung Sister    Suicidality Sister    Suicidality Brother    Arthritis Brother    Prostate cancer Maternal Uncle    Prostate cancer Maternal Uncle    Stroke Maternal Grandmother    Prostate cancer Maternal Grandfather    Cervical cancer Paternal Grandmother    Throat cancer Paternal Grandfather    Colon cancer Neg Hx    Rectal cancer Neg Hx    Stomach cancer Neg Hx      Review of Systems  Musculoskeletal:  Positive for arthralgias.       Left knee  All other systems reviewed and are negative.  Objective:  Physical Exam Constitutional:      Appearance: Normal appearance.  HENT:     Head: Normocephalic and atraumatic.     Mouth/Throat:     Pharynx: Oropharynx is clear.  Eyes:     Extraocular Movements: Extraocular movements intact.  Cardiovascular:     Pulses: Normal pulses.  Pulmonary:     Effort: Pulmonary effort is normal.  Abdominal:     Palpations: Abdomen is soft.  Musculoskeletal:     Cervical back: Normal range of motion.     Comments: Left knee motion is from 0-110.  She has extreme pain to palpation along the medial aspect.  I do not feel an effusion.  Ligaments are stable.  There is crepitation.  Hip motion is full and straight leg raise is negative.  Sensation and motor function are intact in her feet with palpable pulses on both sides.   Skin:    General: Skin is warm and dry.  Neurological:     General: No focal deficit present.     Mental Status: She is alert and oriented to person, place, and time. Mental status is at baseline.  Psychiatric:        Mood and Affect: Mood normal.        Behavior: Behavior normal.        Thought Content: Thought content normal.        Judgment: Judgment normal.    Vital signs in last 24 hours:    Labs:   Estimated body mass index is 34.13 kg/m as calculated from the  following:   Height as of 06/08/21: 5\' 2"  (1.575 m).   Weight as of 06/08/21: 84.6 kg.   Imaging Review Plain radiographs demonstrate severe degenerative joint disease of the left knee(s). The overall alignment isneutral. The bone quality appears to be good for age and reported activity level.      Assessment/Plan:  End stage primary arthritis, left knee   The patient history, physical examination, clinical judgment of the provider and imaging studies are consistent with end stage degenerative joint disease of  the left knee(s) and total knee arthroplasty is deemed medically necessary. The treatment options including medical management, injection therapy arthroscopy and arthroplasty were discussed at length. The risks and benefits of total knee arthroplasty were presented and reviewed. The risks due to aseptic loosening, infection, stiffness, patella tracking problems, thromboembolic complications and other imponderables were discussed. The patient acknowledged the explanation, agreed to proceed with the plan and consent was signed. Patient is being admitted for inpatient treatment for surgery, pain control, PT, OT, prophylactic antibiotics, VTE prophylaxis, progressive ambulation and ADL's and discharge planning. The patient is planning to be discharged home with home health services  Patient's anticipated LOS is less than 2 midnights, meeting these requirements: - Younger than 47 - Lives within 1 hour of care - Has a competent adult at home to recover with post-op recover - NO history of  - Chronic pain requiring opiods  - Diabetes  - Coronary Artery Disease  - Heart failure  - Heart attack  - Stroke  - DVT/VTE  - Cardiac arrhythmia  - Respiratory Failure/COPD  - Renal failure  - Anemia  - Advanced Liver disease

## 2021-06-20 ENCOUNTER — Encounter (HOSPITAL_COMMUNITY): Payer: Self-pay | Admitting: Orthopaedic Surgery

## 2021-06-20 ENCOUNTER — Ambulatory Visit (HOSPITAL_COMMUNITY): Payer: BC Managed Care – PPO | Admitting: Anesthesiology

## 2021-06-20 ENCOUNTER — Other Ambulatory Visit: Payer: Self-pay | Admitting: Internal Medicine

## 2021-06-20 ENCOUNTER — Encounter: Payer: Self-pay | Admitting: Internal Medicine

## 2021-06-20 ENCOUNTER — Encounter (HOSPITAL_COMMUNITY)
Admission: RE | Disposition: A | Discharge status: Home / Self Care | Payer: Self-pay | Source: Ambulatory Visit | Attending: Orthopaedic Surgery

## 2021-06-20 ENCOUNTER — Ambulatory Visit (HOSPITAL_COMMUNITY)
Admission: RE | Admit: 2021-06-20 | Discharge: 2021-06-20 | Disposition: A | Discharge status: Home / Self Care | Payer: BC Managed Care – PPO | Source: Ambulatory Visit | Attending: Orthopaedic Surgery | Admitting: Orthopaedic Surgery

## 2021-06-20 DIAGNOSIS — I10 Essential (primary) hypertension: Secondary | ICD-10-CM | POA: Diagnosis not present

## 2021-06-20 DIAGNOSIS — K219 Gastro-esophageal reflux disease without esophagitis: Secondary | ICD-10-CM | POA: Insufficient documentation

## 2021-06-20 DIAGNOSIS — E041 Nontoxic single thyroid nodule: Secondary | ICD-10-CM | POA: Insufficient documentation

## 2021-06-20 DIAGNOSIS — M1712 Unilateral primary osteoarthritis, left knee: Secondary | ICD-10-CM | POA: Diagnosis not present

## 2021-06-20 DIAGNOSIS — Z8543 Personal history of malignant neoplasm of ovary: Secondary | ICD-10-CM | POA: Insufficient documentation

## 2021-06-20 DIAGNOSIS — K760 Fatty (change of) liver, not elsewhere classified: Secondary | ICD-10-CM | POA: Insufficient documentation

## 2021-06-20 DIAGNOSIS — F32A Depression, unspecified: Secondary | ICD-10-CM | POA: Insufficient documentation

## 2021-06-20 DIAGNOSIS — Z96652 Presence of left artificial knee joint: Secondary | ICD-10-CM | POA: Diagnosis not present

## 2021-06-20 DIAGNOSIS — G8918 Other acute postprocedural pain: Secondary | ICD-10-CM | POA: Diagnosis not present

## 2021-06-20 DIAGNOSIS — R918 Other nonspecific abnormal finding of lung field: Secondary | ICD-10-CM | POA: Insufficient documentation

## 2021-06-20 DIAGNOSIS — F419 Anxiety disorder, unspecified: Secondary | ICD-10-CM | POA: Insufficient documentation

## 2021-06-20 HISTORY — PX: TOTAL KNEE ARTHROPLASTY: SHX125

## 2021-06-20 LAB — ABO/RH: ABO/RH(D): A POS

## 2021-06-20 LAB — GLUCOSE, CAPILLARY: Glucose-Capillary: 89 mg/dL (ref 70–99)

## 2021-06-20 SURGERY — ARTHROPLASTY, KNEE, TOTAL
Anesthesia: Monitor Anesthesia Care | Site: Knee | Laterality: Left

## 2021-06-20 MED ORDER — FENTANYL CITRATE (PF) 100 MCG/2ML IJ SOLN
INTRAMUSCULAR | Status: AC
  Filled 2021-06-20: qty 2

## 2021-06-20 MED ORDER — HYDROMORPHONE HCL 1 MG/ML IJ SOLN
0.2500 mg | INTRAMUSCULAR | Status: DC | PRN
  Administered 2021-06-20 (×2): 0.5 mg via INTRAVENOUS

## 2021-06-20 MED ORDER — TRANEXAMIC ACID-NACL 1000-0.7 MG/100ML-% IV SOLN
INTRAVENOUS | Status: AC
  Filled 2021-06-20: qty 100

## 2021-06-20 MED ORDER — LACTATED RINGERS IV SOLN: INTRAVENOUS | Status: DC

## 2021-06-20 MED ORDER — STERILE WATER FOR IRRIGATION IR SOLN
Status: DC | PRN
  Administered 2021-06-20: 2000 mL

## 2021-06-20 MED ORDER — FENTANYL CITRATE (PF) 100 MCG/2ML IJ SOLN
INTRAMUSCULAR | Status: DC | PRN
  Administered 2021-06-20 (×2): 50 ug via INTRAVENOUS

## 2021-06-20 MED ORDER — TRANEXAMIC ACID 1000 MG/10ML IV SOLN
INTRAVENOUS | Status: DC | PRN
  Administered 2021-06-20: 08:00:00 2000 mg via TOPICAL

## 2021-06-20 MED ORDER — BUPIVACAINE LIPOSOME 1.3 % IJ SUSP: 20.0000 mL | Freq: Once | INTRAMUSCULAR | Status: DC

## 2021-06-20 MED ORDER — 0.9 % SODIUM CHLORIDE (POUR BTL) OPTIME
TOPICAL | Status: DC | PRN
  Administered 2021-06-20: 08:00:00 1000 mL

## 2021-06-20 MED ORDER — BUPIVACAINE IN DEXTROSE 0.75-8.25 % IT SOLN
INTRATHECAL | Status: DC | PRN
  Administered 2021-06-20: 1.6 mL via INTRATHECAL

## 2021-06-20 MED ORDER — DEXAMETHASONE SODIUM PHOSPHATE 10 MG/ML IJ SOLN
INTRAMUSCULAR | Status: DC | PRN
  Administered 2021-06-20: 10 mg

## 2021-06-20 MED ORDER — KETOROLAC TROMETHAMINE 30 MG/ML IJ SOLN
30.0000 mg | Freq: Once | INTRAMUSCULAR | Status: AC | PRN
  Administered 2021-06-20: 10:00:00 30 mg via INTRAVENOUS

## 2021-06-20 MED ORDER — PROPOFOL 10 MG/ML IV BOLUS
INTRAVENOUS | Status: DC | PRN
  Administered 2021-06-20: 40 mg via INTRAVENOUS

## 2021-06-20 MED ORDER — ACETAMINOPHEN 500 MG PO TABS
1000.0000 mg | ORAL_TABLET | Freq: Once | ORAL | Status: AC
  Administered 2021-06-20: 06:00:00 1000 mg via ORAL
  Filled 2021-06-20: qty 2

## 2021-06-20 MED ORDER — ONDANSETRON HCL 4 MG PO TABS
4.0000 mg | ORAL_TABLET | Freq: Four times a day (QID) | ORAL | Status: DC | PRN
  Filled 2021-06-20: qty 1

## 2021-06-20 MED ORDER — ACETAMINOPHEN 325 MG PO TABS
325.0000 mg | ORAL_TABLET | Freq: Four times a day (QID) | ORAL | Status: DC | PRN
Start: 2021-06-21 — End: 2021-06-20

## 2021-06-20 MED ORDER — SODIUM CHLORIDE (PF) 0.9 % IJ SOLN
INTRAMUSCULAR | Status: DC | PRN
  Administered 2021-06-20: 30 mL

## 2021-06-20 MED ORDER — PROPOFOL 500 MG/50ML IV EMUL
INTRAVENOUS | Status: DC | PRN
  Administered 2021-06-20: 100 ug/kg/min via INTRAVENOUS

## 2021-06-20 MED ORDER — CEFAZOLIN SODIUM-DEXTROSE 2-4 GM/100ML-% IV SOLN: 2.0000 g | Freq: Four times a day (QID) | INTRAVENOUS | Status: DC

## 2021-06-20 MED ORDER — FENTANYL CITRATE PF 50 MCG/ML IJ SOSY
PREFILLED_SYRINGE | INTRAMUSCULAR | Status: AC
  Filled 2021-06-20: qty 3

## 2021-06-20 MED ORDER — OXYCODONE HCL 5 MG/5ML PO SOLN: 5.0000 mg | Freq: Once | ORAL | Status: DC | PRN

## 2021-06-20 MED ORDER — ONDANSETRON HCL 4 MG/2ML IJ SOLN
INTRAMUSCULAR | Status: AC
  Filled 2021-06-20: qty 2

## 2021-06-20 MED ORDER — CEFAZOLIN SODIUM-DEXTROSE 2-4 GM/100ML-% IV SOLN
INTRAVENOUS | Status: AC
  Filled 2021-06-20: qty 100

## 2021-06-20 MED ORDER — HYDROCODONE-ACETAMINOPHEN 5-325 MG PO TABS
1.0000 | ORAL_TABLET | Freq: Four times a day (QID) | ORAL | 0 refills | Status: DC | PRN
Start: 2021-06-20 — End: 2021-08-28

## 2021-06-20 MED ORDER — ORAL CARE MOUTH RINSE: 15.0000 mL | Freq: Once | OROMUCOSAL | Status: AC

## 2021-06-20 MED ORDER — PROPOFOL 1000 MG/100ML IV EMUL
INTRAVENOUS | Status: AC
  Filled 2021-06-20: qty 100

## 2021-06-20 MED ORDER — CHLORHEXIDINE GLUCONATE 0.12 % MT SOLN
15.0000 mL | Freq: Once | OROMUCOSAL | Status: AC
  Administered 2021-06-20: 06:00:00 15 mL via OROMUCOSAL

## 2021-06-20 MED ORDER — LIDOCAINE HCL (PF) 2 % IJ SOLN
INTRAMUSCULAR | Status: DC | PRN
  Administered 2021-06-20: 30 mg via INTRADERMAL

## 2021-06-20 MED ORDER — HYDROMORPHONE HCL 1 MG/ML IJ SOLN
INTRAMUSCULAR | Status: AC
  Administered 2021-06-20: 10:00:00 0.5 mg via INTRAVENOUS
  Filled 2021-06-20: qty 2

## 2021-06-20 MED ORDER — METHOCARBAMOL 500 MG IVPB - SIMPLE MED
INTRAVENOUS | Status: AC
  Administered 2021-06-20: 10:00:00 500 mg via INTRAVENOUS
  Filled 2021-06-20: qty 50

## 2021-06-20 MED ORDER — TRANEXAMIC ACID-NACL 1000-0.7 MG/100ML-% IV SOLN
1000.0000 mg | Freq: Once | INTRAVENOUS | Status: AC
  Administered 2021-06-20: 12:00:00 1000 mg via INTRAVENOUS

## 2021-06-20 MED ORDER — LACTATED RINGERS IV BOLUS
250.0000 mL | Freq: Once | INTRAVENOUS | Status: AC
  Administered 2021-06-20: 11:00:00 250 mL via INTRAVENOUS

## 2021-06-20 MED ORDER — KETOROLAC TROMETHAMINE 30 MG/ML IJ SOLN
INTRAMUSCULAR | Status: AC
  Filled 2021-06-20: qty 1

## 2021-06-20 MED ORDER — TRANEXAMIC ACID 1000 MG/10ML IV SOLN
2000.0000 mg | INTRAVENOUS | Status: DC
  Filled 2021-06-20: qty 20

## 2021-06-20 MED ORDER — PROPOFOL 500 MG/50ML IV EMUL
INTRAVENOUS | Status: AC
  Filled 2021-06-20: qty 50

## 2021-06-20 MED ORDER — MORPHINE SULFATE (PF) 2 MG/ML IV SOLN: 0.5000 mg | INTRAVENOUS | Status: DC | PRN

## 2021-06-20 MED ORDER — BUPIVACAINE LIPOSOME 1.3 % IJ SUSP
INTRAMUSCULAR | Status: DC | PRN
  Administered 2021-06-20: 20 mL

## 2021-06-20 MED ORDER — HYDROCODONE-ACETAMINOPHEN 7.5-325 MG PO TABS: 1.0000 | ORAL_TABLET | ORAL | Status: DC | PRN

## 2021-06-20 MED ORDER — METHOCARBAMOL 500 MG IVPB - SIMPLE MED: 500.0000 mg | Freq: Four times a day (QID) | INTRAVENOUS | Status: DC | PRN

## 2021-06-20 MED ORDER — METOCLOPRAMIDE HCL 5 MG PO TABS
5.0000 mg | ORAL_TABLET | Freq: Three times a day (TID) | ORAL | Status: DC | PRN
  Filled 2021-06-20: qty 2

## 2021-06-20 MED ORDER — METHOCARBAMOL 500 MG PO TABS: 500.0000 mg | ORAL_TABLET | Freq: Four times a day (QID) | ORAL | Status: DC | PRN

## 2021-06-20 MED ORDER — ACETAMINOPHEN 500 MG PO TABS: 500.0000 mg | ORAL_TABLET | Freq: Four times a day (QID) | ORAL | Status: DC

## 2021-06-20 MED ORDER — BUPIVACAINE-EPINEPHRINE 0.5% -1:200000 IJ SOLN
INTRAMUSCULAR | Status: DC | PRN
  Administered 2021-06-20: 30 mL

## 2021-06-20 MED ORDER — KETOROLAC TROMETHAMINE 15 MG/ML IJ SOLN: 15.0000 mg | Freq: Four times a day (QID) | INTRAMUSCULAR | Status: DC

## 2021-06-20 MED ORDER — METOCLOPRAMIDE HCL 5 MG/ML IJ SOLN
5.0000 mg | Freq: Three times a day (TID) | INTRAMUSCULAR | Status: DC | PRN
Start: 2021-06-20 — End: 2021-06-20

## 2021-06-20 MED ORDER — AMISULPRIDE (ANTIEMETIC) 5 MG/2ML IV SOLN: 10.0000 mg | Freq: Once | INTRAVENOUS | Status: DC | PRN

## 2021-06-20 MED ORDER — TIZANIDINE HCL 4 MG PO TABS: 4.0000 mg | ORAL_TABLET | Freq: Four times a day (QID) | ORAL | 0 refills | Status: DC | PRN

## 2021-06-20 MED ORDER — OXYCODONE HCL 5 MG PO TABS: 5.0000 mg | ORAL_TABLET | Freq: Once | ORAL | Status: DC | PRN

## 2021-06-20 MED ORDER — BUPIVACAINE LIPOSOME 1.3 % IJ SUSP
INTRAMUSCULAR | Status: AC
  Filled 2021-06-20: qty 20

## 2021-06-20 MED ORDER — CEFAZOLIN SODIUM-DEXTROSE 2-4 GM/100ML-% IV SOLN
2.0000 g | Freq: Once | INTRAVENOUS | Status: AC
  Administered 2021-06-20: 08:00:00 2 g via INTRAVENOUS

## 2021-06-20 MED ORDER — SODIUM CHLORIDE (PF) 0.9 % IJ SOLN
INTRAMUSCULAR | Status: AC
  Filled 2021-06-20: qty 10

## 2021-06-20 MED ORDER — POVIDONE-IODINE 10 % EX SWAB: 2.0000 "application " | Freq: Once | CUTANEOUS | Status: DC

## 2021-06-20 MED ORDER — BUPIVACAINE-EPINEPHRINE (PF) 0.25% -1:200000 IJ SOLN
INTRAMUSCULAR | Status: AC
  Filled 2021-06-20: qty 30

## 2021-06-20 MED ORDER — HYDROCODONE-ACETAMINOPHEN 5-325 MG PO TABS: 1.0000 | ORAL_TABLET | ORAL | Status: DC | PRN

## 2021-06-20 MED ORDER — MIDAZOLAM HCL 5 MG/5ML IJ SOLN
INTRAMUSCULAR | Status: DC | PRN
  Administered 2021-06-20 (×2): 1 mg via INTRAVENOUS

## 2021-06-20 MED ORDER — ONDANSETRON HCL 4 MG/2ML IJ SOLN: 4.0000 mg | Freq: Once | INTRAMUSCULAR | Status: DC | PRN

## 2021-06-20 MED ORDER — LACTATED RINGERS IV BOLUS
250.0000 mL | Freq: Once | INTRAVENOUS | Status: AC
  Administered 2021-06-20: 14:00:00 250 mL via INTRAVENOUS

## 2021-06-20 MED ORDER — PHENYLEPHRINE HCL-NACL 20-0.9 MG/250ML-% IV SOLN
INTRAVENOUS | Status: DC | PRN
  Administered 2021-06-20: 35 ug/min via INTRAVENOUS

## 2021-06-20 MED ORDER — ROPIVACAINE HCL 5 MG/ML IJ SOLN
INTRAMUSCULAR | Status: DC | PRN
  Administered 2021-06-20: 30 mL via PERINEURAL

## 2021-06-20 MED ORDER — TRANEXAMIC ACID-NACL 1000-0.7 MG/100ML-% IV SOLN
1000.0000 mg | INTRAVENOUS | Status: AC
  Administered 2021-06-20: 08:00:00 1000 mg via INTRAVENOUS
  Filled 2021-06-20: qty 100

## 2021-06-20 MED ORDER — LACTATED RINGERS IV BOLUS
500.0000 mL | Freq: Once | INTRAVENOUS | Status: AC
  Administered 2021-06-20: 11:00:00 500 mL via INTRAVENOUS

## 2021-06-20 MED ORDER — ONDANSETRON HCL 4 MG/2ML IJ SOLN
INTRAMUSCULAR | Status: DC | PRN
  Administered 2021-06-20: 4 mg via INTRAVENOUS

## 2021-06-20 MED ORDER — ONDANSETRON HCL 4 MG/2ML IJ SOLN
4.0000 mg | Freq: Four times a day (QID) | INTRAMUSCULAR | Status: DC | PRN
  Administered 2021-06-20: 11:00:00 4 mg via INTRAVENOUS

## 2021-06-20 MED ORDER — MIDAZOLAM HCL 2 MG/2ML IJ SOLN
INTRAMUSCULAR | Status: AC
  Filled 2021-06-20: qty 2

## 2021-06-20 MED ORDER — VANCOMYCIN HCL IN DEXTROSE 1-5 GM/200ML-% IV SOLN
1000.0000 mg | INTRAVENOUS | Status: AC
  Administered 2021-06-20: 07:00:00 1000 mg via INTRAVENOUS
  Filled 2021-06-20: qty 200

## 2021-06-20 SURGICAL SUPPLY — 55 items
ATTUNE PSFEM LTSZ5 NARCEM KNEE (Femur) ×2 IMPLANT
ATTUNE PSRP INSE SZ 5 7MM KNEE (Insert) ×1 IMPLANT
ATTUNE PSRP INSE SZ5 7 KNEE (Insert) ×1 IMPLANT
BAG COUNTER SPONGE SURGICOUNT (BAG) ×2 IMPLANT
BAG DECANTER FOR FLEXI CONT (MISCELLANEOUS) ×3 IMPLANT
BAG SURGICOUNT SPONGE COUNTING (BAG) ×1
BAG ZIPLOCK 12X15 (MISCELLANEOUS) ×3 IMPLANT
BASE TIBIAL ROT PLAT SZ 5 KNEE (Knees) IMPLANT
BLADE SAGITTAL 25.0X1.19X90 (BLADE) ×2 IMPLANT
BLADE SAGITTAL 25.0X1.19X90MM (BLADE) ×1
BLADE SAW SGTL 11.0X1.19X90.0M (BLADE) ×3 IMPLANT
BLADE SURG SZ10 CARB STEEL (BLADE) ×3 IMPLANT
BNDG ELASTIC 6X5.8 VLCR STR LF (GAUZE/BANDAGES/DRESSINGS) ×3 IMPLANT
BOOTIES KNEE HIGH SLOAN (MISCELLANEOUS) ×3 IMPLANT
BOWL SMART MIX CTS (DISPOSABLE) ×3 IMPLANT
CEMENT HV SMART SET (Cement) ×6 IMPLANT
COVER SURGICAL LIGHT HANDLE (MISCELLANEOUS) ×3 IMPLANT
CUFF TOURN SGL QUICK 34 (TOURNIQUET CUFF) ×2
CUFF TRNQT CYL 34X4.125X (TOURNIQUET CUFF) ×1 IMPLANT
DECANTER SPIKE VIAL GLASS SM (MISCELLANEOUS) ×6 IMPLANT
DRAPE INCISE IOBAN 66X45 STRL (DRAPES) ×3 IMPLANT
DRAPE SHEET LG 3/4 BI-LAMINATE (DRAPES) ×3 IMPLANT
DRAPE U-SHAPE 47X51 STRL (DRAPES) ×3 IMPLANT
DRESSING AQUACEL AG SP 3.5X10 (GAUZE/BANDAGES/DRESSINGS) IMPLANT
DRSG AQUACEL AG ADV 3.5X10 (GAUZE/BANDAGES/DRESSINGS) ×3 IMPLANT
DRSG AQUACEL AG SP 3.5X10 (GAUZE/BANDAGES/DRESSINGS) ×3
DURAPREP 26ML APPLICATOR (WOUND CARE) ×6 IMPLANT
ELECT REM PT RETURN 15FT ADLT (MISCELLANEOUS) ×3 IMPLANT
GLOVE SRG 8 PF TXTR STRL LF DI (GLOVE) ×2 IMPLANT
GLOVE SURG ENC MOIS LTX SZ8 (GLOVE) ×6 IMPLANT
GLOVE SURG UNDER POLY LF SZ8 (GLOVE) ×4
GOWN STRL REUS W/TWL XL LVL3 (GOWN DISPOSABLE) ×6 IMPLANT
HANDPIECE INTERPULSE COAX TIP (DISPOSABLE) ×2
HOLDER FOLEY CATH W/STRAP (MISCELLANEOUS) IMPLANT
HOOD PEEL AWAY FLYTE STAYCOOL (MISCELLANEOUS) ×9 IMPLANT
KIT TURNOVER KIT A (KITS) IMPLANT
MANIFOLD NEPTUNE II (INSTRUMENTS) ×3 IMPLANT
NEEDLE HYPO 22GX1.5 SAFETY (NEEDLE) ×3 IMPLANT
NS IRRIG 1000ML POUR BTL (IV SOLUTION) ×3 IMPLANT
PACK TOTAL KNEE CUSTOM (KITS) ×3 IMPLANT
PAD ARMBOARD 7.5X6 YLW CONV (MISCELLANEOUS) ×3 IMPLANT
PATELLA MEDIAL ATTUN 35MM KNEE (Knees) ×2 IMPLANT
PROTECTOR NERVE ULNAR (MISCELLANEOUS) ×3 IMPLANT
SET HNDPC FAN SPRY TIP SCT (DISPOSABLE) ×1 IMPLANT
SUT ETHIBOND NAB CT1 #1 30IN (SUTURE) ×6 IMPLANT
SUT VIC AB 0 CT1 36 (SUTURE) ×3 IMPLANT
SUT VIC AB 2-0 CT1 27 (SUTURE) ×2
SUT VIC AB 2-0 CT1 TAPERPNT 27 (SUTURE) ×1 IMPLANT
SUT VICRYL AB 3-0 FS1 BRD 27IN (SUTURE) ×3 IMPLANT
SUT VLOC 180 0 24IN GS25 (SUTURE) ×3 IMPLANT
TIBIAL BASE ROT PLAT SZ 5 KNEE (Knees) ×3 IMPLANT
TRAY FOLEY MTR SLVR 16FR STAT (SET/KITS/TRAYS/PACK) IMPLANT
WATER STERILE IRR 1000ML POUR (IV SOLUTION) ×3 IMPLANT
WRAP KNEE MAXI GEL POST OP (GAUZE/BANDAGES/DRESSINGS) ×3 IMPLANT
YANKAUER SUCT BULB TIP NO VENT (SUCTIONS) ×3 IMPLANT

## 2021-06-20 NOTE — Anesthesia Procedure Notes (Signed)
Spinal  Patient location during procedure: OR Start time: 06/20/2021 7:34 AM End time: 06/20/2021 7:39 AM Reason for block: surgical anesthesia Staffing Performed: anesthesiologist  Anesthesiologist: Pervis Hocking, DO Preanesthetic Checklist Completed: patient identified, IV checked, risks and benefits discussed, surgical consent, monitors and equipment checked, pre-op evaluation and timeout performed Spinal Block Patient position: sitting Prep: DuraPrep and site prepped and draped Patient monitoring: cardiac monitor, continuous pulse ox and blood pressure Approach: midline Location: L3-4 Injection technique: single-shot Needle Needle type: Pencan  Needle gauge: 24 G Needle length: 9 cm Assessment Sensory level: T6 Events: CSF return and second provider Additional Notes Functioning IV was confirmed and monitors were applied. Sterile prep and drape, including hand hygiene and sterile gloves were used. The patient was positioned and the spine was prepped. The skin was anesthetized with lidocaine.  Free flow of clear CSF was obtained prior to injecting local anesthetic into the CSF.  The spinal needle aspirated freely following injection.  The needle was carefully withdrawn.  The patient tolerated the procedure well.

## 2021-06-20 NOTE — Anesthesia Procedure Notes (Signed)
Anesthesia Regional Block: Adductor canal block   Pre-Anesthetic Checklist: , timeout performed,  Correct Patient, Correct Site, Correct Laterality,  Correct Procedure, Correct Position, site marked,  Risks and benefits discussed,  Surgical consent,  Pre-op evaluation,  At surgeon's request and post-op pain management  Laterality: Left  Prep: Maximum Sterile Barrier Precautions used, chloraprep       Needles:  Injection technique: Single-shot  Needle Type: Echogenic Stimulator Needle     Needle Length: 9cm  Needle Gauge: 22     Additional Needles:   Procedures:,,,, ultrasound used (permanent image in chart),,    Narrative:  Start time: 06/20/2021 6:50 AM End time: 06/20/2021 6:55 AM Injection made incrementally with aspirations every 5 mL.  Performed by: Personally  Anesthesiologist: Pervis Hocking, DO  Additional Notes: Monitors applied. No increased pain on injection. No increased resistance to injection. Injection made in 5cc increments. Good needle visualization. Patient tolerated procedure well.

## 2021-06-20 NOTE — Evaluation (Signed)
Physical Therapy Evaluation Patient Details Name: Alyssa [name] MRN: [id number] DOB: 15-Jun-1961 Today's Date: 06/20/2021  History of Present Illness  Patient is 60 y.o. female s/p Lt TKA on 06/20/21 with PMH significant for OA, HTn, GERD, ovarian cancer.  Clinical Impression  Alyssa [name] is a 60 y.o. female POD 0 s/p Lt TKA. Patient reports independence with mobility at baseline. Patient is now limited by functional impairments (see PT problem list below) and requires min guard/supervision for transfers and gait with RW. Patient was able to ambulate ~125 feet with RW and min guard/superivsion and cues for safe walker management. Patient educated on safe sequencing for stair mobility and verbalized safe guarding position for people assisting with mobility. Patient instructed in exercises to facilitate ROM and circulation. Patient will benefit from continued skilled PT interventions to address impairments and progress towards PLOF. Patient has met mobility goals at adequate level for discharge home; will continue to follow if pt continues acute stay to progress towards Mod I goals.        Recommendations for follow up therapy are one component of a multi-disciplinary discharge planning process, led by the attending physician.  Recommendations may be updated based on patient status, additional functional criteria and insurance authorization.  Follow Up Recommendations Follow physician's recommendations for discharge plan and follow up therapies    Assistance Recommended at Discharge Frequent or constant Supervision/Assistance  Functional Status Assessment Patient has had a recent decline in their functional status and demonstrates the ability to make significant improvements in function in a reasonable and predictable amount of time.  Equipment Recommendations  None recommended by PT    Recommendations for Other Services       Precautions / Restrictions Precautions Precautions:  Fall Restrictions Weight Bearing Restrictions: No Other Position/Activity Restrictions: WBAT      Mobility  Bed Mobility Overal bed mobility: Needs Assistance Bed Mobility: Supine to Sit     Supine to sit: Supervision     General bed mobility comments: cues to initiate and sequence, supervision for safety.    Transfers Overall transfer level: Needs assistance Equipment used: Rolling walker (2 wheels) Transfers: Sit to/from Stand Sit to Stand: Supervision           General transfer comment: cues for hand placement with rise from EOB, no assist needed and pt steady in standing. Cues for safe reach back to recliner and toilet and to extend Lt knee to prevent excessive flexion with sitting.    Ambulation/Gait Ambulation/Gait assistance: Min guard;Supervision Gait Distance (Feet): 125 Feet Assistive device: Rolling walker (2 wheels) Gait Pattern/deviations: Step-to pattern;Decreased stride length Gait velocity: decr     General Gait Details: cues for step to pattern and proximity to RW, pt ambulated ~25 and c/o nuasea and overheated sensation, seated rest provided, pt had small bout of dry heaves and then it resolved. pt able to ambulate ~40' to bathroom and then 60' back to room, no ongoing symptoms and pt maintained safe walker management.  Stairs            Wheelchair Mobility    Modified Rankin (Stroke Patients Only)       Balance Overall balance assessment: Needs assistance Sitting-balance support: Feet supported Sitting balance-Leahy Scale: Good     Standing balance support: During functional activity;Bilateral upper extremity supported;Reliant on assistive device for balance Standing balance-Leahy Scale: Poor  Pertinent Vitals/Pain Pain Assessment: 0-10 Pain Score: 4  Pain Location: Lt knee Pain Descriptors / Indicators: Aching;Discomfort Pain Intervention(s): Limited activity within patient's  tolerance;Monitored during session;Repositioned    Home Living Family/patient expects to be discharged to:: Private residence Living Arrangements: Spouse/significant other;Other relatives Available Help at Discharge: Family Type of Home: House         Home Layout: One level Home Equipment: Conservation officer, nature (2 wheels);Cane - single point;Crutches Additional Comments: pt's daughter will stay and granddaughter is there too (23 y.o.) and husband is off next week for Christmas break.    Prior Function Prior Level of Function : Independent/Modified Independent;Working/employed                     Hand Dominance   Dominant Hand: Right    Extremity/Trunk Assessment   Upper Extremity Assessment Upper Extremity Assessment: Overall WFL for tasks assessed    Lower Extremity Assessment Lower Extremity Assessment: LLE deficits/detail LLE Deficits / Details: good quad activation LLE Sensation: WNL LLE Coordination: WNL    Cervical / Trunk Assessment Cervical / Trunk Assessment: Normal  Communication   Communication: No difficulties  Cognition Arousal/Alertness: Awake/alert Behavior During Therapy: WFL for tasks assessed/performed Overall Cognitive Status: Within Functional Limits for tasks assessed                                          General Comments      Exercises Total Joint Exercises Ankle Circles/Pumps: AROM;Both;Seated;15 reps Quad Sets: AROM;Left;Seated;Other reps (comment) Short Arc Quad: AROM;Left;Seated;Other reps (comment) Heel Slides: AROM;Left;Seated;Other reps (comment) Hip ABduction/ADduction: AROM;Left;Seated;Other reps (comment) Straight Leg Raises: AROM;Left;Seated;Other reps (comment)   Assessment/Plan    PT Assessment Patient needs continued PT services  PT Problem List Decreased strength;Decreased range of motion;Decreased activity tolerance;Decreased balance;Decreased mobility;Decreased knowledge of use of DME       PT  Treatment Interventions DME instruction;Gait training;Stair training;Functional mobility training;Therapeutic activities;Therapeutic exercise;Balance training;Patient/family education    PT Goals (Current goals can be found in the Care Plan section)  Acute Rehab PT Goals Patient Stated Goal: get back towork and working around home PT Goal Formulation: With patient Time For Goal Achievement: 06/27/21 Potential to Achieve Goals: Good    Frequency 7X/week   Barriers to discharge        Co-evaluation               AM-PAC PT "6 Clicks" Mobility  Outcome Measure Help needed turning from your back to your side while in a flat bed without using bedrails?: A Little Help needed moving from lying on your back to sitting on the side of a flat bed without using bedrails?: A Little Help needed moving to and from a bed to a chair (including a wheelchair)?: A Little Help needed standing up from a chair using your arms (e.g., wheelchair or bedside chair)?: A Little Help needed to walk in hospital room?: A Little Help needed climbing 3-5 steps with a railing? : A Little 6 Click Score: 18    End of Session Equipment Utilized During Treatment: Gait belt Activity Tolerance: Patient tolerated treatment well Patient left: in chair;with call bell/phone within reach Nurse Communication: Mobility status PT Visit Diagnosis: Muscle weakness (generalized) (M62.81);Difficulty in walking, not elsewhere classified (R26.2)    Time: 1256-1340 PT Time Calculation (min) (ACUTE ONLY): 44 min   Charges:   PT Evaluation $PT Eval  Low Complexity: 1 Low PT Treatments $Gait Training: 8-22 mins $Therapeutic Exercise: 8-22 mins        Verner Mould, DPT Acute Rehabilitation Services Office [phone] Pager [phone]   Jacques Navy 06/20/2021, 3:45 PM

## 2021-06-20 NOTE — Interval H&P Note (Signed)
History and Physical Interval Note:  06/20/2021 7:32 AM  Alyssa [name]  has presented today for surgery, with the diagnosis of [place].  The various methods of treatment have been discussed with the patient and family. After consideration of risks, benefits and other options for treatment, the patient has consented to  Procedure(s): LEFT TOTAL KNEE ARTHROPLASTY (Left) as a surgical intervention.  The patient's history has been reviewed, patient examined, no change in status, stable for surgery.  I have reviewed the patient's chart and labs.  Questions were answered to the patient's satisfaction.     [name]

## 2021-06-20 NOTE — Anesthesia Postprocedure Evaluation (Signed)
Anesthesia Post Note  Patient: Tawania Daponte  Procedure(s) Performed: LEFT TOTAL KNEE ARTHROPLASTY (Left: Knee)     Patient location during evaluation: PACU Anesthesia Type: Regional, Spinal and MAC Level of consciousness: awake and alert and oriented Pain management: pain level controlled Vital Signs Assessment: post-procedure vital signs reviewed and stable Respiratory status: spontaneous breathing, nonlabored ventilation and respiratory function stable Cardiovascular status: blood pressure returned to baseline and stable Postop Assessment: no headache, no backache, spinal receding and no apparent nausea or vomiting Anesthetic complications: no   No notable events documented.  Last Vitals:  Vitals:   06/20/21 0945 06/20/21 1000  BP: (!) 141/88 (!) 143/90  Pulse: 94 96  Resp: (!) 23 (!) 21  Temp:    SpO2: 98% 99%    Last Pain:  Vitals:   06/20/21 1000  TempSrc:   PainSc: [place]

## 2021-06-20 NOTE — Op Note (Signed)
PREOP DIAGNOSIS: DJD LEFT KNEE POSTOP DIAGNOSIS:  same PROCEDURE: LEFT TKR ANESTHESIA: Spinal and MAC ATTENDING SURGEON: [name] ASSISTANT: Loni Dolly PA  INDICATIONS FOR PROCEDURE: Alyssa [name] is a 60 y.o. female who has struggled for a long time with pain due to degenerative arthritis of the left knee.  The patient has failed many conservative non-operative measures and at this point has pain which limits the ability to sleep and walk.  The patient is offered total knee replacement.  Informed operative consent was obtained after discussion of possible risks of anesthesia, infection, neurovascular injury, DVT, and death.  The importance of the post-operative rehabilitation protocol to optimize result was stressed extensively with the patient.  SUMMARY OF FINDINGS AND PROCEDURE:  Alyssa [name] was taken to the operative suite where under the above anesthesia a left knee replacement was performed.  There were advanced degenerative changes and the bone quality was excellent.  We used the DePuyAttune system and placed size 5 narrow femur, 5 tibia, 35 mm all polyethylene patella, and a size 7 mm spacer.  Loni Dolly PA-C assisted throughout and was invaluable to the completion of the case in that he helped retract and maintain exposure while I placed the components.  He also helped close thereby minimizing OR time.  The patient was admitted for appropriate post-op care to include perioperative antibiotics and mechanical and pharmacologic measures for DVT prophylaxis.  DESCRIPTION OF PROCEDURE:  Alyssa [name] was taken to the operative suite where the above anesthesia was applied.  The patient was positioned supine and prepped and draped in normal sterile fashion.  An appropriate time out was performed.  After the administration of vancomycin and kefzol pre-op antibiotic the leg was elevated and exsanguinated and a tourniquet inflated.  A standard longitudinal incision was made on the anterior  knee.  Dissection was carried down to the extensor mechanism.  All appropriate anti-infective measures were used including the pre-operative antibiotic, betadine impregnated drape, and closed hooded exhaust systems for each member of the surgical team.  A medial parapatellar incision was made in the extensor mechanism and the knee cap flipped and the knee flexed.  Some residual meniscal tissues were removed along with any remaining ACL/PCL tissue.  A guide was placed on the tibia and a flat cut was made on it's superior surface.  An intramedullary guide was placed in the femur and was utilized to make anterior and posterior cuts creating an appropriate flexion gap.  A second intramedullary guide was placed in the femur to make a distal cut properly balancing the knee with an extension gap equal to the flexion gap.  The three bones sized to the above mentioned sizes and the appropriate guides were placed and utilized.  A trial reduction was done and the knee easily came to full extension and the patella tracked well on flexion.  The trial components were removed and all bones were cleaned with pulsatile lavage and then dried thoroughly.  Cement was mixed and was pressurized onto the bones followed by placement of the aforementioned components.  Excess cement was trimmed and pressure was held on the components until the cement had hardened.  The tourniquet was deflated and a small amount of bleeding was controlled with cautery and pressure.  The knee was irrigated thoroughly.  The extensor mechanism was re-approximated with #1 ethibond in interrupted fashion.  The knee was flexed and the repair was solid.  The subcutaneous tissues were re-approximated with #0 and #2-0 vicryl and the skin  closed with a subcuticular stitch and steristrips.  A sterile dressing was applied.  Intraoperative fluids, EBL, and tourniquet time can be obtained from anesthesia records.  DISPOSITION:  The patient was taken to recovery room in  stable condition and scheduled to potentially go home same day depending on ability to walk and tolerate liquids.  [name] 06/20/2021, 9:01 AM

## 2021-06-20 NOTE — Transfer of Care (Signed)
Immediate Anesthesia Transfer of Care Note  Patient: Alyssa [name]  Procedure(s) Performed: LEFT TOTAL KNEE ARTHROPLASTY (Left: Knee)  Patient Location: PACU  Anesthesia Type:Spinal  Level of Consciousness: awake, alert  and oriented  Airway & Oxygen Therapy: Patient Spontanous Breathing and Patient connected to face mask oxygen  Post-op Assessment: Report given to RN and Post -op Vital signs reviewed and stable  Post vital signs: Reviewed and stable  Last Vitals:  Vitals Value Taken Time  BP 133/96 06/20/21 0933  Temp    Pulse 98 06/20/21 0933  Resp 29 06/20/21 0933  SpO2 100 % 06/20/21 0933  Vitals shown include unvalidated device data.  Last Pain:  Vitals:   06/20/21 0558  TempSrc:   PainSc: 0-No pain      Patients Stated Pain Goal: 3 (62/86/38 1771)  Complications: No notable events documented.

## 2021-06-21 ENCOUNTER — Encounter (HOSPITAL_COMMUNITY): Payer: Self-pay | Admitting: Orthopaedic Surgery

## 2021-06-22 DIAGNOSIS — F32A Depression, unspecified: Secondary | ICD-10-CM | POA: Diagnosis not present

## 2021-06-22 DIAGNOSIS — G8929 Other chronic pain: Secondary | ICD-10-CM | POA: Diagnosis not present

## 2021-06-22 DIAGNOSIS — E669 Obesity, unspecified: Secondary | ICD-10-CM | POA: Diagnosis not present

## 2021-06-22 DIAGNOSIS — Z6829 Body mass index (BMI) 29.0-29.9, adult: Secondary | ICD-10-CM | POA: Diagnosis not present

## 2021-06-22 DIAGNOSIS — R7303 Prediabetes: Secondary | ICD-10-CM | POA: Diagnosis not present

## 2021-06-22 DIAGNOSIS — Z90710 Acquired absence of both cervix and uterus: Secondary | ICD-10-CM | POA: Diagnosis not present

## 2021-06-22 DIAGNOSIS — G620 Drug-induced polyneuropathy: Secondary | ICD-10-CM | POA: Diagnosis not present

## 2021-06-22 DIAGNOSIS — K219 Gastro-esophageal reflux disease without esophagitis: Secondary | ICD-10-CM | POA: Diagnosis not present

## 2021-06-22 DIAGNOSIS — F419 Anxiety disorder, unspecified: Secondary | ICD-10-CM | POA: Diagnosis not present

## 2021-06-22 DIAGNOSIS — Z791 Long term (current) use of non-steroidal anti-inflammatories (NSAID): Secondary | ICD-10-CM | POA: Diagnosis not present

## 2021-06-22 DIAGNOSIS — Z8543 Personal history of malignant neoplasm of ovary: Secondary | ICD-10-CM | POA: Diagnosis not present

## 2021-06-22 DIAGNOSIS — Z9089 Acquired absence of other organs: Secondary | ICD-10-CM | POA: Diagnosis not present

## 2021-06-22 DIAGNOSIS — T451X5D Adverse effect of antineoplastic and immunosuppressive drugs, subsequent encounter: Secondary | ICD-10-CM | POA: Diagnosis not present

## 2021-06-22 DIAGNOSIS — Z96652 Presence of left artificial knee joint: Secondary | ICD-10-CM | POA: Diagnosis not present

## 2021-06-22 DIAGNOSIS — M19041 Primary osteoarthritis, right hand: Secondary | ICD-10-CM | POA: Diagnosis not present

## 2021-06-22 DIAGNOSIS — Z471 Aftercare following joint replacement surgery: Secondary | ICD-10-CM | POA: Diagnosis not present

## 2021-06-27 DIAGNOSIS — E669 Obesity, unspecified: Secondary | ICD-10-CM | POA: Diagnosis not present

## 2021-06-27 DIAGNOSIS — Z471 Aftercare following joint replacement surgery: Secondary | ICD-10-CM | POA: Diagnosis not present

## 2021-06-27 DIAGNOSIS — M19041 Primary osteoarthritis, right hand: Secondary | ICD-10-CM | POA: Diagnosis not present

## 2021-06-27 DIAGNOSIS — Z791 Long term (current) use of non-steroidal anti-inflammatories (NSAID): Secondary | ICD-10-CM | POA: Diagnosis not present

## 2021-06-27 DIAGNOSIS — Z8543 Personal history of malignant neoplasm of ovary: Secondary | ICD-10-CM | POA: Diagnosis not present

## 2021-06-27 DIAGNOSIS — F32A Depression, unspecified: Secondary | ICD-10-CM | POA: Diagnosis not present

## 2021-06-27 DIAGNOSIS — R7303 Prediabetes: Secondary | ICD-10-CM | POA: Diagnosis not present

## 2021-06-27 DIAGNOSIS — G620 Drug-induced polyneuropathy: Secondary | ICD-10-CM | POA: Diagnosis not present

## 2021-06-27 DIAGNOSIS — G8929 Other chronic pain: Secondary | ICD-10-CM | POA: Diagnosis not present

## 2021-06-27 DIAGNOSIS — K219 Gastro-esophageal reflux disease without esophagitis: Secondary | ICD-10-CM | POA: Diagnosis not present

## 2021-06-27 DIAGNOSIS — Z96652 Presence of left artificial knee joint: Secondary | ICD-10-CM | POA: Diagnosis not present

## 2021-06-27 DIAGNOSIS — F419 Anxiety disorder, unspecified: Secondary | ICD-10-CM | POA: Diagnosis not present

## 2021-06-27 DIAGNOSIS — Z90710 Acquired absence of both cervix and uterus: Secondary | ICD-10-CM | POA: Diagnosis not present

## 2021-06-27 DIAGNOSIS — Z9089 Acquired absence of other organs: Secondary | ICD-10-CM | POA: Diagnosis not present

## 2021-06-27 DIAGNOSIS — Z6829 Body mass index (BMI) 29.0-29.9, adult: Secondary | ICD-10-CM | POA: Diagnosis not present

## 2021-06-27 DIAGNOSIS — T451X5D Adverse effect of antineoplastic and immunosuppressive drugs, subsequent encounter: Secondary | ICD-10-CM | POA: Diagnosis not present

## 2021-06-28 DIAGNOSIS — Z471 Aftercare following joint replacement surgery: Secondary | ICD-10-CM | POA: Diagnosis not present

## 2021-06-28 DIAGNOSIS — R7303 Prediabetes: Secondary | ICD-10-CM | POA: Diagnosis not present

## 2021-06-28 DIAGNOSIS — F419 Anxiety disorder, unspecified: Secondary | ICD-10-CM | POA: Diagnosis not present

## 2021-06-28 DIAGNOSIS — Z9089 Acquired absence of other organs: Secondary | ICD-10-CM | POA: Diagnosis not present

## 2021-06-28 DIAGNOSIS — G8929 Other chronic pain: Secondary | ICD-10-CM | POA: Diagnosis not present

## 2021-06-28 DIAGNOSIS — G620 Drug-induced polyneuropathy: Secondary | ICD-10-CM | POA: Diagnosis not present

## 2021-06-28 DIAGNOSIS — Z8543 Personal history of malignant neoplasm of ovary: Secondary | ICD-10-CM | POA: Diagnosis not present

## 2021-06-28 DIAGNOSIS — Z96652 Presence of left artificial knee joint: Secondary | ICD-10-CM | POA: Diagnosis not present

## 2021-06-28 DIAGNOSIS — Z791 Long term (current) use of non-steroidal anti-inflammatories (NSAID): Secondary | ICD-10-CM | POA: Diagnosis not present

## 2021-06-28 DIAGNOSIS — F32A Depression, unspecified: Secondary | ICD-10-CM | POA: Diagnosis not present

## 2021-06-28 DIAGNOSIS — K219 Gastro-esophageal reflux disease without esophagitis: Secondary | ICD-10-CM | POA: Diagnosis not present

## 2021-06-28 DIAGNOSIS — T451X5D Adverse effect of antineoplastic and immunosuppressive drugs, subsequent encounter: Secondary | ICD-10-CM | POA: Diagnosis not present

## 2021-06-28 DIAGNOSIS — M19041 Primary osteoarthritis, right hand: Secondary | ICD-10-CM | POA: Diagnosis not present

## 2021-06-28 DIAGNOSIS — E669 Obesity, unspecified: Secondary | ICD-10-CM | POA: Diagnosis not present

## 2021-06-28 DIAGNOSIS — Z6829 Body mass index (BMI) 29.0-29.9, adult: Secondary | ICD-10-CM | POA: Diagnosis not present

## 2021-06-28 DIAGNOSIS — Z90710 Acquired absence of both cervix and uterus: Secondary | ICD-10-CM | POA: Diagnosis not present

## 2021-06-29 DIAGNOSIS — Z9889 Other specified postprocedural states: Secondary | ICD-10-CM | POA: Diagnosis not present

## 2021-06-29 DIAGNOSIS — M25562 Pain in left knee: Secondary | ICD-10-CM | POA: Diagnosis not present

## 2021-06-30 ENCOUNTER — Ambulatory Visit
Admission: RE | Admit: 2021-06-30 | Discharge: 2021-06-30 | Disposition: A | Discharge status: Home / Self Care | Payer: BC Managed Care – PPO | Source: Ambulatory Visit | Attending: Internal Medicine | Admitting: Internal Medicine

## 2021-06-30 ENCOUNTER — Other Ambulatory Visit: Payer: Self-pay

## 2021-06-30 DIAGNOSIS — G8929 Other chronic pain: Secondary | ICD-10-CM | POA: Diagnosis not present

## 2021-06-30 DIAGNOSIS — Z471 Aftercare following joint replacement surgery: Secondary | ICD-10-CM | POA: Diagnosis not present

## 2021-06-30 DIAGNOSIS — G620 Drug-induced polyneuropathy: Secondary | ICD-10-CM | POA: Diagnosis not present

## 2021-06-30 DIAGNOSIS — F32A Depression, unspecified: Secondary | ICD-10-CM | POA: Diagnosis not present

## 2021-06-30 DIAGNOSIS — E669 Obesity, unspecified: Secondary | ICD-10-CM | POA: Diagnosis not present

## 2021-06-30 DIAGNOSIS — T451X5D Adverse effect of antineoplastic and immunosuppressive drugs, subsequent encounter: Secondary | ICD-10-CM | POA: Diagnosis not present

## 2021-06-30 DIAGNOSIS — Z8543 Personal history of malignant neoplasm of ovary: Secondary | ICD-10-CM | POA: Diagnosis not present

## 2021-06-30 DIAGNOSIS — R7303 Prediabetes: Secondary | ICD-10-CM | POA: Diagnosis not present

## 2021-06-30 DIAGNOSIS — E041 Nontoxic single thyroid nodule: Secondary | ICD-10-CM

## 2021-06-30 DIAGNOSIS — Z9089 Acquired absence of other organs: Secondary | ICD-10-CM | POA: Diagnosis not present

## 2021-06-30 DIAGNOSIS — Z96652 Presence of left artificial knee joint: Secondary | ICD-10-CM | POA: Diagnosis not present

## 2021-06-30 DIAGNOSIS — K219 Gastro-esophageal reflux disease without esophagitis: Secondary | ICD-10-CM | POA: Diagnosis not present

## 2021-06-30 DIAGNOSIS — Z90710 Acquired absence of both cervix and uterus: Secondary | ICD-10-CM | POA: Diagnosis not present

## 2021-06-30 DIAGNOSIS — Z791 Long term (current) use of non-steroidal anti-inflammatories (NSAID): Secondary | ICD-10-CM | POA: Diagnosis not present

## 2021-06-30 DIAGNOSIS — Z6829 Body mass index (BMI) 29.0-29.9, adult: Secondary | ICD-10-CM | POA: Diagnosis not present

## 2021-06-30 DIAGNOSIS — F419 Anxiety disorder, unspecified: Secondary | ICD-10-CM | POA: Diagnosis not present

## 2021-06-30 DIAGNOSIS — M19041 Primary osteoarthritis, right hand: Secondary | ICD-10-CM | POA: Diagnosis not present

## 2021-07-04 DIAGNOSIS — F32A Depression, unspecified: Secondary | ICD-10-CM | POA: Diagnosis not present

## 2021-07-04 DIAGNOSIS — Z96652 Presence of left artificial knee joint: Secondary | ICD-10-CM | POA: Diagnosis not present

## 2021-07-04 DIAGNOSIS — Z791 Long term (current) use of non-steroidal anti-inflammatories (NSAID): Secondary | ICD-10-CM | POA: Diagnosis not present

## 2021-07-04 DIAGNOSIS — Z8543 Personal history of malignant neoplasm of ovary: Secondary | ICD-10-CM | POA: Diagnosis not present

## 2021-07-04 DIAGNOSIS — G8929 Other chronic pain: Secondary | ICD-10-CM | POA: Diagnosis not present

## 2021-07-04 DIAGNOSIS — Z90710 Acquired absence of both cervix and uterus: Secondary | ICD-10-CM | POA: Diagnosis not present

## 2021-07-04 DIAGNOSIS — Z471 Aftercare following joint replacement surgery: Secondary | ICD-10-CM | POA: Diagnosis not present

## 2021-07-04 DIAGNOSIS — R7303 Prediabetes: Secondary | ICD-10-CM | POA: Diagnosis not present

## 2021-07-04 DIAGNOSIS — F419 Anxiety disorder, unspecified: Secondary | ICD-10-CM | POA: Diagnosis not present

## 2021-07-04 DIAGNOSIS — M19041 Primary osteoarthritis, right hand: Secondary | ICD-10-CM | POA: Diagnosis not present

## 2021-07-04 DIAGNOSIS — E669 Obesity, unspecified: Secondary | ICD-10-CM | POA: Diagnosis not present

## 2021-07-04 DIAGNOSIS — K219 Gastro-esophageal reflux disease without esophagitis: Secondary | ICD-10-CM | POA: Diagnosis not present

## 2021-07-04 DIAGNOSIS — Z6829 Body mass index (BMI) 29.0-29.9, adult: Secondary | ICD-10-CM | POA: Diagnosis not present

## 2021-07-04 DIAGNOSIS — T451X5D Adverse effect of antineoplastic and immunosuppressive drugs, subsequent encounter: Secondary | ICD-10-CM | POA: Diagnosis not present

## 2021-07-04 DIAGNOSIS — Z9089 Acquired absence of other organs: Secondary | ICD-10-CM | POA: Diagnosis not present

## 2021-07-04 DIAGNOSIS — G620 Drug-induced polyneuropathy: Secondary | ICD-10-CM | POA: Diagnosis not present

## 2021-07-05 ENCOUNTER — Encounter: Payer: Self-pay | Admitting: Internal Medicine

## 2021-07-06 ENCOUNTER — Other Ambulatory Visit: Payer: Self-pay

## 2021-07-06 MED ORDER — LOSARTAN POTASSIUM 50 MG PO TABS: 50.0000 mg | ORAL_TABLET | Freq: Every day | ORAL | 0 refills | Status: DC

## 2021-07-07 DIAGNOSIS — Z6829 Body mass index (BMI) 29.0-29.9, adult: Secondary | ICD-10-CM | POA: Diagnosis not present

## 2021-07-07 DIAGNOSIS — Z791 Long term (current) use of non-steroidal anti-inflammatories (NSAID): Secondary | ICD-10-CM | POA: Diagnosis not present

## 2021-07-07 DIAGNOSIS — G8929 Other chronic pain: Secondary | ICD-10-CM | POA: Diagnosis not present

## 2021-07-07 DIAGNOSIS — F32A Depression, unspecified: Secondary | ICD-10-CM | POA: Diagnosis not present

## 2021-07-07 DIAGNOSIS — R7303 Prediabetes: Secondary | ICD-10-CM | POA: Diagnosis not present

## 2021-07-07 DIAGNOSIS — Z8543 Personal history of malignant neoplasm of ovary: Secondary | ICD-10-CM | POA: Diagnosis not present

## 2021-07-07 DIAGNOSIS — M19041 Primary osteoarthritis, right hand: Secondary | ICD-10-CM | POA: Diagnosis not present

## 2021-07-07 DIAGNOSIS — T451X5D Adverse effect of antineoplastic and immunosuppressive drugs, subsequent encounter: Secondary | ICD-10-CM | POA: Diagnosis not present

## 2021-07-07 DIAGNOSIS — Z9089 Acquired absence of other organs: Secondary | ICD-10-CM | POA: Diagnosis not present

## 2021-07-07 DIAGNOSIS — Z90710 Acquired absence of both cervix and uterus: Secondary | ICD-10-CM | POA: Diagnosis not present

## 2021-07-07 DIAGNOSIS — E669 Obesity, unspecified: Secondary | ICD-10-CM | POA: Diagnosis not present

## 2021-07-07 DIAGNOSIS — G620 Drug-induced polyneuropathy: Secondary | ICD-10-CM | POA: Diagnosis not present

## 2021-07-07 DIAGNOSIS — F419 Anxiety disorder, unspecified: Secondary | ICD-10-CM | POA: Diagnosis not present

## 2021-07-07 DIAGNOSIS — K219 Gastro-esophageal reflux disease without esophagitis: Secondary | ICD-10-CM | POA: Diagnosis not present

## 2021-07-07 DIAGNOSIS — Z471 Aftercare following joint replacement surgery: Secondary | ICD-10-CM | POA: Diagnosis not present

## 2021-07-07 DIAGNOSIS — Z96652 Presence of left artificial knee joint: Secondary | ICD-10-CM | POA: Diagnosis not present

## 2021-07-11 DIAGNOSIS — Z90710 Acquired absence of both cervix and uterus: Secondary | ICD-10-CM | POA: Diagnosis not present

## 2021-07-11 DIAGNOSIS — K219 Gastro-esophageal reflux disease without esophagitis: Secondary | ICD-10-CM | POA: Diagnosis not present

## 2021-07-11 DIAGNOSIS — F32A Depression, unspecified: Secondary | ICD-10-CM | POA: Diagnosis not present

## 2021-07-11 DIAGNOSIS — G620 Drug-induced polyneuropathy: Secondary | ICD-10-CM | POA: Diagnosis not present

## 2021-07-11 DIAGNOSIS — Z6829 Body mass index (BMI) 29.0-29.9, adult: Secondary | ICD-10-CM | POA: Diagnosis not present

## 2021-07-11 DIAGNOSIS — M19041 Primary osteoarthritis, right hand: Secondary | ICD-10-CM | POA: Diagnosis not present

## 2021-07-11 DIAGNOSIS — Z791 Long term (current) use of non-steroidal anti-inflammatories (NSAID): Secondary | ICD-10-CM | POA: Diagnosis not present

## 2021-07-11 DIAGNOSIS — Z471 Aftercare following joint replacement surgery: Secondary | ICD-10-CM | POA: Diagnosis not present

## 2021-07-11 DIAGNOSIS — E669 Obesity, unspecified: Secondary | ICD-10-CM | POA: Diagnosis not present

## 2021-07-11 DIAGNOSIS — Z8543 Personal history of malignant neoplasm of ovary: Secondary | ICD-10-CM | POA: Diagnosis not present

## 2021-07-11 DIAGNOSIS — R7303 Prediabetes: Secondary | ICD-10-CM | POA: Diagnosis not present

## 2021-07-11 DIAGNOSIS — G8929 Other chronic pain: Secondary | ICD-10-CM | POA: Diagnosis not present

## 2021-07-11 DIAGNOSIS — Z9089 Acquired absence of other organs: Secondary | ICD-10-CM | POA: Diagnosis not present

## 2021-07-11 DIAGNOSIS — Z96652 Presence of left artificial knee joint: Secondary | ICD-10-CM | POA: Diagnosis not present

## 2021-07-11 DIAGNOSIS — F419 Anxiety disorder, unspecified: Secondary | ICD-10-CM | POA: Diagnosis not present

## 2021-07-11 DIAGNOSIS — T451X5D Adverse effect of antineoplastic and immunosuppressive drugs, subsequent encounter: Secondary | ICD-10-CM | POA: Diagnosis not present

## 2021-07-14 DIAGNOSIS — Z8543 Personal history of malignant neoplasm of ovary: Secondary | ICD-10-CM | POA: Diagnosis not present

## 2021-07-14 DIAGNOSIS — Z9089 Acquired absence of other organs: Secondary | ICD-10-CM | POA: Diagnosis not present

## 2021-07-14 DIAGNOSIS — Z6829 Body mass index (BMI) 29.0-29.9, adult: Secondary | ICD-10-CM | POA: Diagnosis not present

## 2021-07-14 DIAGNOSIS — F419 Anxiety disorder, unspecified: Secondary | ICD-10-CM | POA: Diagnosis not present

## 2021-07-14 DIAGNOSIS — Z791 Long term (current) use of non-steroidal anti-inflammatories (NSAID): Secondary | ICD-10-CM | POA: Diagnosis not present

## 2021-07-14 DIAGNOSIS — Z90710 Acquired absence of both cervix and uterus: Secondary | ICD-10-CM | POA: Diagnosis not present

## 2021-07-14 DIAGNOSIS — Z96652 Presence of left artificial knee joint: Secondary | ICD-10-CM | POA: Diagnosis not present

## 2021-07-14 DIAGNOSIS — G8929 Other chronic pain: Secondary | ICD-10-CM | POA: Diagnosis not present

## 2021-07-14 DIAGNOSIS — Z471 Aftercare following joint replacement surgery: Secondary | ICD-10-CM | POA: Diagnosis not present

## 2021-07-14 DIAGNOSIS — E669 Obesity, unspecified: Secondary | ICD-10-CM | POA: Diagnosis not present

## 2021-07-14 DIAGNOSIS — R7303 Prediabetes: Secondary | ICD-10-CM | POA: Diagnosis not present

## 2021-07-14 DIAGNOSIS — F32A Depression, unspecified: Secondary | ICD-10-CM | POA: Diagnosis not present

## 2021-07-14 DIAGNOSIS — T451X5D Adverse effect of antineoplastic and immunosuppressive drugs, subsequent encounter: Secondary | ICD-10-CM | POA: Diagnosis not present

## 2021-07-14 DIAGNOSIS — M19041 Primary osteoarthritis, right hand: Secondary | ICD-10-CM | POA: Diagnosis not present

## 2021-07-14 DIAGNOSIS — G620 Drug-induced polyneuropathy: Secondary | ICD-10-CM | POA: Diagnosis not present

## 2021-07-14 DIAGNOSIS — K219 Gastro-esophageal reflux disease without esophagitis: Secondary | ICD-10-CM | POA: Diagnosis not present

## 2021-07-18 DIAGNOSIS — Z791 Long term (current) use of non-steroidal anti-inflammatories (NSAID): Secondary | ICD-10-CM | POA: Diagnosis not present

## 2021-07-18 DIAGNOSIS — E669 Obesity, unspecified: Secondary | ICD-10-CM | POA: Diagnosis not present

## 2021-07-18 DIAGNOSIS — R7303 Prediabetes: Secondary | ICD-10-CM | POA: Diagnosis not present

## 2021-07-18 DIAGNOSIS — G8929 Other chronic pain: Secondary | ICD-10-CM | POA: Diagnosis not present

## 2021-07-18 DIAGNOSIS — Z96652 Presence of left artificial knee joint: Secondary | ICD-10-CM | POA: Diagnosis not present

## 2021-07-18 DIAGNOSIS — Z6829 Body mass index (BMI) 29.0-29.9, adult: Secondary | ICD-10-CM | POA: Diagnosis not present

## 2021-07-18 DIAGNOSIS — M19041 Primary osteoarthritis, right hand: Secondary | ICD-10-CM | POA: Diagnosis not present

## 2021-07-18 DIAGNOSIS — Z8543 Personal history of malignant neoplasm of ovary: Secondary | ICD-10-CM | POA: Diagnosis not present

## 2021-07-18 DIAGNOSIS — F32A Depression, unspecified: Secondary | ICD-10-CM | POA: Diagnosis not present

## 2021-07-18 DIAGNOSIS — G620 Drug-induced polyneuropathy: Secondary | ICD-10-CM | POA: Diagnosis not present

## 2021-07-18 DIAGNOSIS — F419 Anxiety disorder, unspecified: Secondary | ICD-10-CM | POA: Diagnosis not present

## 2021-07-18 DIAGNOSIS — K219 Gastro-esophageal reflux disease without esophagitis: Secondary | ICD-10-CM | POA: Diagnosis not present

## 2021-07-18 DIAGNOSIS — Z471 Aftercare following joint replacement surgery: Secondary | ICD-10-CM | POA: Diagnosis not present

## 2021-07-18 DIAGNOSIS — T451X5D Adverse effect of antineoplastic and immunosuppressive drugs, subsequent encounter: Secondary | ICD-10-CM | POA: Diagnosis not present

## 2021-07-18 DIAGNOSIS — Z90710 Acquired absence of both cervix and uterus: Secondary | ICD-10-CM | POA: Diagnosis not present

## 2021-07-18 DIAGNOSIS — Z9089 Acquired absence of other organs: Secondary | ICD-10-CM | POA: Diagnosis not present

## 2021-07-19 DIAGNOSIS — Z791 Long term (current) use of non-steroidal anti-inflammatories (NSAID): Secondary | ICD-10-CM | POA: Diagnosis not present

## 2021-07-19 DIAGNOSIS — M19041 Primary osteoarthritis, right hand: Secondary | ICD-10-CM | POA: Diagnosis not present

## 2021-07-19 DIAGNOSIS — F32A Depression, unspecified: Secondary | ICD-10-CM | POA: Diagnosis not present

## 2021-07-19 DIAGNOSIS — Z96652 Presence of left artificial knee joint: Secondary | ICD-10-CM | POA: Diagnosis not present

## 2021-07-19 DIAGNOSIS — Z6829 Body mass index (BMI) 29.0-29.9, adult: Secondary | ICD-10-CM | POA: Diagnosis not present

## 2021-07-19 DIAGNOSIS — Z9089 Acquired absence of other organs: Secondary | ICD-10-CM | POA: Diagnosis not present

## 2021-07-19 DIAGNOSIS — Z90710 Acquired absence of both cervix and uterus: Secondary | ICD-10-CM | POA: Diagnosis not present

## 2021-07-19 DIAGNOSIS — Z8543 Personal history of malignant neoplasm of ovary: Secondary | ICD-10-CM | POA: Diagnosis not present

## 2021-07-19 DIAGNOSIS — Z471 Aftercare following joint replacement surgery: Secondary | ICD-10-CM | POA: Diagnosis not present

## 2021-07-19 DIAGNOSIS — G8929 Other chronic pain: Secondary | ICD-10-CM | POA: Diagnosis not present

## 2021-07-19 DIAGNOSIS — F419 Anxiety disorder, unspecified: Secondary | ICD-10-CM | POA: Diagnosis not present

## 2021-07-19 DIAGNOSIS — G620 Drug-induced polyneuropathy: Secondary | ICD-10-CM | POA: Diagnosis not present

## 2021-07-19 DIAGNOSIS — R7303 Prediabetes: Secondary | ICD-10-CM | POA: Diagnosis not present

## 2021-07-19 DIAGNOSIS — T451X5D Adverse effect of antineoplastic and immunosuppressive drugs, subsequent encounter: Secondary | ICD-10-CM | POA: Diagnosis not present

## 2021-07-19 DIAGNOSIS — E669 Obesity, unspecified: Secondary | ICD-10-CM | POA: Diagnosis not present

## 2021-07-19 DIAGNOSIS — K219 Gastro-esophageal reflux disease without esophagitis: Secondary | ICD-10-CM | POA: Diagnosis not present

## 2021-07-21 ENCOUNTER — Other Ambulatory Visit: Payer: Self-pay | Admitting: Family Medicine

## 2021-07-21 DIAGNOSIS — Z96652 Presence of left artificial knee joint: Secondary | ICD-10-CM | POA: Diagnosis not present

## 2021-07-21 DIAGNOSIS — T451X5D Adverse effect of antineoplastic and immunosuppressive drugs, subsequent encounter: Secondary | ICD-10-CM | POA: Diagnosis not present

## 2021-07-21 DIAGNOSIS — Z471 Aftercare following joint replacement surgery: Secondary | ICD-10-CM | POA: Diagnosis not present

## 2021-07-21 DIAGNOSIS — F32A Depression, unspecified: Secondary | ICD-10-CM | POA: Diagnosis not present

## 2021-07-21 DIAGNOSIS — R7303 Prediabetes: Secondary | ICD-10-CM | POA: Diagnosis not present

## 2021-07-21 DIAGNOSIS — Z90710 Acquired absence of both cervix and uterus: Secondary | ICD-10-CM | POA: Diagnosis not present

## 2021-07-21 DIAGNOSIS — M19041 Primary osteoarthritis, right hand: Secondary | ICD-10-CM | POA: Diagnosis not present

## 2021-07-21 DIAGNOSIS — Z9089 Acquired absence of other organs: Secondary | ICD-10-CM | POA: Diagnosis not present

## 2021-07-21 DIAGNOSIS — Z6829 Body mass index (BMI) 29.0-29.9, adult: Secondary | ICD-10-CM | POA: Diagnosis not present

## 2021-07-21 DIAGNOSIS — G620 Drug-induced polyneuropathy: Secondary | ICD-10-CM | POA: Diagnosis not present

## 2021-07-21 DIAGNOSIS — Z8543 Personal history of malignant neoplasm of ovary: Secondary | ICD-10-CM | POA: Diagnosis not present

## 2021-07-21 DIAGNOSIS — E669 Obesity, unspecified: Secondary | ICD-10-CM | POA: Diagnosis not present

## 2021-07-21 DIAGNOSIS — F419 Anxiety disorder, unspecified: Secondary | ICD-10-CM | POA: Diagnosis not present

## 2021-07-21 DIAGNOSIS — K219 Gastro-esophageal reflux disease without esophagitis: Secondary | ICD-10-CM | POA: Diagnosis not present

## 2021-07-21 DIAGNOSIS — G8929 Other chronic pain: Secondary | ICD-10-CM | POA: Diagnosis not present

## 2021-07-21 DIAGNOSIS — Z791 Long term (current) use of non-steroidal anti-inflammatories (NSAID): Secondary | ICD-10-CM | POA: Diagnosis not present

## 2021-07-24 DIAGNOSIS — Z4733 Aftercare following explantation of knee joint prosthesis: Secondary | ICD-10-CM | POA: Diagnosis not present

## 2021-07-27 DIAGNOSIS — Z4733 Aftercare following explantation of knee joint prosthesis: Secondary | ICD-10-CM | POA: Diagnosis not present

## 2021-08-01 DIAGNOSIS — Z4733 Aftercare following explantation of knee joint prosthesis: Secondary | ICD-10-CM | POA: Diagnosis not present

## 2021-08-02 DIAGNOSIS — Z4733 Aftercare following explantation of knee joint prosthesis: Secondary | ICD-10-CM | POA: Diagnosis not present

## 2021-08-04 DIAGNOSIS — Z4733 Aftercare following explantation of knee joint prosthesis: Secondary | ICD-10-CM | POA: Diagnosis not present

## 2021-08-07 DIAGNOSIS — Z4733 Aftercare following explantation of knee joint prosthesis: Secondary | ICD-10-CM | POA: Diagnosis not present

## 2021-08-08 ENCOUNTER — Other Ambulatory Visit: Payer: Self-pay | Admitting: Internal Medicine

## 2021-08-14 ENCOUNTER — Other Ambulatory Visit: Payer: Self-pay | Admitting: Family Medicine

## 2021-08-14 DIAGNOSIS — Z4733 Aftercare following explantation of knee joint prosthesis: Secondary | ICD-10-CM | POA: Diagnosis not present

## 2021-08-20 ENCOUNTER — Other Ambulatory Visit: Payer: Self-pay | Admitting: Family Medicine

## 2021-08-22 DIAGNOSIS — Z4733 Aftercare following explantation of knee joint prosthesis: Secondary | ICD-10-CM | POA: Diagnosis not present

## 2021-08-24 DIAGNOSIS — Z4733 Aftercare following explantation of knee joint prosthesis: Secondary | ICD-10-CM | POA: Diagnosis not present

## 2021-08-28 ENCOUNTER — Encounter: Payer: Self-pay | Admitting: Internal Medicine

## 2021-08-28 DIAGNOSIS — D440 Neoplasm of uncertain behavior of thyroid gland: Secondary | ICD-10-CM | POA: Diagnosis not present

## 2021-08-28 NOTE — Progress Notes (Signed)
Subjective:    Patient ID: Alyssa [name], female    DOB: [birth year], 61 y.o.   MRN: [id number]  This visit occurred during the SARS-CoV-2 public health emergency.  Safety protocols were in place, including screening questions prior to the visit, additional usage of staff PPE, and extensive cleaning of exam room while observing appropriate contact time as indicated for disinfecting solutions.     HPI The patient is here for follow up of their chronic medical problems, including htn, prediabetes, GERD, anxiety/depression  He saw Dr Benjamine Mola yesterday regarding her thyroid nodule/mass- advised to go to [place] because it is substernal.     Medications and allergies reviewed with patient and updated if appropriate.  Patient Active Problem List   Diagnosis Date Noted   S/P TKR (total knee replacement), left 08/29/2021   Primary localized osteoarthritis of left knee 06/20/2021   Hepatic steatosis 06/20/2021   Thyroid nodule 06/20/2021   Multiple pulmonary nodules determined by computed tomography of lung 06/20/2021   Vitamin D deficiency 02/28/2021   Anxiety and depression 02/28/2021   Left medial knee pain 09/15/2020   Gluteal tendonitis of right buttock 04/03/2019   Frozen shoulder 04/28/2018   Greater trochanteric bursitis of right hip 04/28/2018   Chronic low back pain 04/10/2018   Left shoulder pain 04/10/2018   Palpitations 10/11/2017   Hand arthritis 10/11/2017   Midline low back pain without sciatica 10/11/2017   Genetic testing 12/27/2016   Prediabetes 10/04/2016   Right hip pain 06/19/2016   Family history of prostate cancer 12/01/2015   Esophageal reflux 09/15/2015   Obesity (BMI 30-39.9) 06/03/2015   Essential hypertension 11/11/2014   Chemotherapy-induced peripheral neuropathy ([place]) 10/18/2014   Vasomotor instability 10/05/2014   Carcinoma of left ovary ([place]) 08/24/2014   Surgical menopause 08/24/2014   Ovarian cancer ([place]) 08/02/2014    Current  Outpatient Medications on File Prior to Visit  Medication Sig Dispense Refill   amLODipine (NORVASC) 5 MG tablet TAKE 1 TABLET BY MOUTH  DAILY 90 tablet 3   Cholecalciferol (VITAMIN D3 PO) Take 5,000 Units by mouth every evening.     diphenhydrAMINE (BENADRYL) 25 MG tablet Take 50 mg by mouth at bedtime as needed for sleep.     DULoxetine (CYMBALTA) 30 MG capsule Take 1 capsule (30 mg total) by mouth daily. (Patient taking differently: Take 30 mg by mouth every evening.) 90 capsule 1   gabapentin (NEURONTIN) 300 MG capsule Take 1 capsule by mouth at bedtime 30 capsule 0   losartan (COZAAR) 50 MG tablet Take 1 tablet (50 mg total) by mouth daily. 10 tablet 0   omeprazole (PRILOSEC) 20 MG capsule TAKE 1 CAPSULE BY MOUTH  DAILY 90 capsule 3   No current facility-administered medications on file prior to visit.    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   Pre-diabetes    SVD (spontaneous vaginal delivery)    x 3    Past Surgical History:  Procedure Laterality Date   ABDOMINAL HYSTERECTOMY     APPENDECTOMY     with hysterectomy   ENDOMETRIAL ABLATION  2002   LAPAROSCOPIC CHOLECYSTECTOMY  12/09/2016   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   Procedure: LEFT TOTAL KNEE ARTHROPLASTY;  Surgeon: Melrose Nakayama, MD;  Location: [facility];  Service: Orthopedics;  Laterality: Left;   TUBAL  LIGATION  1991   WISDOM TOOTH EXTRACTION      Social History   Socioeconomic History   Marital status: Married    Spouse name: Not on file   Number of children: 3   Years of education: Not on file   Highest education level: Not on file  Occupational History   Not on file  Tobacco Use   Smoking status: Never   Smokeless tobacco: Never  Vaping Use   Vaping Use: Never used  Substance and Sexual Activity   Alcohol use: No   Drug use: No   Sexual activity: Yes     Birth control/protection: Post-menopausal    Comment: Hysterctomy  Other Topics Concern   Not on file  Social History Narrative   Not on file   Social Determinants of Health   Financial Resource Strain: Not on file  Food Insecurity: Not on file  Transportation Needs: Not on file  Physical Activity: Not on file  Stress: Not on file  Social Connections: Not on file    Family History  Problem Relation Age of Onset   Cancer Mother 19       vulvar cancer   Lung cancer Mother 20   Stroke Father    Cancer - Lung Sister    Suicidality Sister    Suicidality Brother    Arthritis Brother    Prostate cancer Maternal Uncle    Prostate cancer Maternal Uncle    Stroke Maternal Grandmother    Prostate cancer Maternal Grandfather    Cervical cancer Paternal Grandmother    Throat cancer Paternal Grandfather    Colon cancer Neg Hx    Rectal cancer Neg Hx    Stomach cancer Neg Hx     Review of Systems  Constitutional:  Negative for chills and fever.  Respiratory:  Negative for cough, shortness of breath and wheezing.   Cardiovascular:  Positive for palpitations (occ). Negative for chest pain and leg swelling.  Genitourinary:  Positive for frequency. Negative for dysuria and hematuria.  Neurological:  Negative for light-headedness and headaches.      Objective:   Vitals:   08/29/21 1031  BP: 110/76  Pulse: 91  Temp: 98.7 F (37.1 C)  SpO2: 93%   BP Readings from Last 3 Encounters:  08/29/21 110/76  06/20/21 133/82  06/08/21 (!) 144/91   Wt Readings from Last 3 Encounters:  08/29/21 196 lb (88.9 kg)  06/20/21 186 lb 9.6 oz (84.6 kg)  06/08/21 186 lb 9.6 oz (84.6 kg)   Body mass index is 35.85 kg/m.   Physical Exam Constitutional:      General: She is not in acute distress.    Appearance: Normal appearance.  HENT:     Head: Normocephalic and atraumatic.  Eyes:     Conjunctiva/sclera: Conjunctivae normal.  Cardiovascular:     Rate and Rhythm: Normal rate and  regular rhythm.     Heart sounds: Normal heart sounds. No murmur heard. Pulmonary:     Effort: Pulmonary effort is normal. No respiratory distress.     Breath sounds: Normal breath sounds. No wheezing.  Musculoskeletal:     Cervical back: Neck supple.     Right lower leg: No edema.     Left lower leg: No edema.  Lymphadenopathy:     Cervical: No cervical adenopathy.  Skin:    Findings: No rash.  Neurological:     Mental Status: She is alert. Mental status is at baseline.  Psychiatric:  Mood and Affect: Mood normal.        Behavior: Behavior normal.         Lab Results  Component Value Date   WBC 4.1 06/08/2021   HGB 14.7 06/08/2021   HCT 44.0 06/08/2021   PLT 273 06/08/2021   GLUCOSE 99 06/08/2021   CHOL 166 02/28/2021   TRIG 77.0 02/28/2021   HDL 57.50 02/28/2021   LDLCALC 93 02/28/2021   ALT 22 02/28/2021   AST 23 02/28/2021   NA 138 06/08/2021   K 4.3 06/08/2021   CL 105 06/08/2021   CREATININE 0.91 06/08/2021   BUN 20 06/08/2021   CO2 27 06/08/2021   TSH 1.23 08/12/2019   INR 1.0 06/08/2021   HGBA1C 5.8 (H) 06/08/2021     US THYROID CLINICAL DATA:  Large exophytic isthmus seen on CT  EXAM: THYROID ULTRASOUND  TECHNIQUE: Ultrasound examination of the thyroid gland and adjacent soft tissues was performed.  COMPARISON:  CT chest 06/19/2021  FINDINGS: Parenchymal Echotexture: Moderately heterogeneous  Isthmus: 1.0 cm  Right lobe: 5.3 x 1.6 x 1.6 cm  Left lobe: 4.2 x 1.5 x 1.6 cm  _________________________________________________________  Estimated total number of nodules >/= 1 cm: 2  Number of spongiform nodules >/=  2 cm not described below (TR1): 0  Number of mixed cystic and solid nodules >/= 1.5 cm not described below (TR2): 0  _________________________________________________________  Nodule # 1:  Location: Isthmus; mid  Maximum size: 2.2 cm; Other 2 dimensions: 2.2 x 1.2 cm  Composition: solid/almost completely solid  (2)  Echogenicity: isoechoic (1)  Shape: not taller-than-wide (0)  Margins: smooth (0)  Echogenic foci: none (0)  ACR TI-RADS total points: 3.  ACR TI-RADS risk category: TR3 (3 points).  ACR TI-RADS recommendations:  *Given size (>/= 1.5 - 2.4 cm) and appearance, a follow-up ultrasound in 1 year should be considered based on TI-RADS criteria.  _________________________________________________________  Nodule # 2:  Location: Isthmus; inferior  Maximum size: 5.8 cm; Other 2 dimensions: 5.8 x 3.7 cm  Composition: solid/almost completely solid (2)  Echogenicity: isoechoic (1)  Shape: not taller-than-wide (0)  Margins: ill-defined (0)  Echogenic foci: none (0)  ACR TI-RADS total points: 3.  ACR TI-RADS risk category: TR3 (3 points).  ACR TI-RADS recommendations:  **Given size (>/= 2.5 cm) and appearance, fine needle aspiration of this mildly suspicious nodule should be considered based on TI-RADS criteria.  _________________________________________________________  Nodule 3: Subcentimeter hypoechoic solid nodule in the superior right thyroid lobe does not meet criteria for FNA or imaging surveillance.  IMPRESSION: 1. Nodule 2 (TI-RADS 3), measuring 5.8 x 5.8 x 3.7 cm, demonstrates retrosternal extension and meets criteria for FNA. This is most likely a thyroid mass given appearance on prior CT and current ultrasound. If confirmation is needed that this is of thyroid origin, nuclear medicine iodine scan can be obtained. 2. Nodule 1 (TI-RADS 3), measuring 2.2 x 2.2 x 1.2 cm, meets criteria for imaging follow-up. Annual ultrasound surveillance is recommended until 5 years of stability is documented.  The above is in keeping with the ACR TI-RADS recommendations - J Am Coll Radiol 2017;14:587-595.  Electronically Signed   By: Miachel Roux M.D.   On: 06/30/2021 11:02   Assessment & Plan:    See Problem List for Assessment and Plan of chronic medical  problems.

## 2021-08-28 NOTE — Patient Instructions (Addendum)
° ° ° °  Blood work was ordered.     Medications changes include :   none   Your prescription(s) have been sent to your pharmacy.    A Ct scan was ordered for follow up of your lung nodules.     Someone from that office will call you to schedule an appointment.    Return in about 27 weeks (around 03/06/2022) for CPE.

## 2021-08-29 ENCOUNTER — Other Ambulatory Visit: Payer: Self-pay | Admitting: Internal Medicine

## 2021-08-29 ENCOUNTER — Other Ambulatory Visit: Payer: Self-pay

## 2021-08-29 ENCOUNTER — Ambulatory Visit (INDEPENDENT_AMBULATORY_CARE_PROVIDER_SITE_OTHER): Payer: BC Managed Care – PPO | Admitting: Internal Medicine

## 2021-08-29 VITALS — BP 110/76 | HR 91 | Temp 98.7°F | Ht 62.0 in | Wt 196.0 lb

## 2021-08-29 DIAGNOSIS — E041 Nontoxic single thyroid nodule: Secondary | ICD-10-CM

## 2021-08-29 DIAGNOSIS — R350 Frequency of micturition: Secondary | ICD-10-CM | POA: Diagnosis not present

## 2021-08-29 DIAGNOSIS — K219 Gastro-esophageal reflux disease without esophagitis: Secondary | ICD-10-CM | POA: Diagnosis not present

## 2021-08-29 DIAGNOSIS — R918 Other nonspecific abnormal finding of lung field: Secondary | ICD-10-CM

## 2021-08-29 DIAGNOSIS — F32A Depression, unspecified: Secondary | ICD-10-CM

## 2021-08-29 DIAGNOSIS — F419 Anxiety disorder, unspecified: Secondary | ICD-10-CM

## 2021-08-29 DIAGNOSIS — R7303 Prediabetes: Secondary | ICD-10-CM

## 2021-08-29 DIAGNOSIS — Z96652 Presence of left artificial knee joint: Secondary | ICD-10-CM | POA: Insufficient documentation

## 2021-08-29 DIAGNOSIS — I10 Essential (primary) hypertension: Secondary | ICD-10-CM | POA: Diagnosis not present

## 2021-08-29 LAB — URINALYSIS, ROUTINE W REFLEX MICROSCOPIC
Bilirubin Urine: NEGATIVE
Hgb urine dipstick: NEGATIVE
Ketones, ur: NEGATIVE
Nitrite: NEGATIVE
RBC / HPF: NONE SEEN (ref 0–?)
Specific Gravity, Urine: 1.025 (ref 1.000–1.030)
Total Protein, Urine: NEGATIVE
Urine Glucose: NEGATIVE
Urobilinogen, UA: 0.2 (ref 0.0–1.0)
pH: 5.5 (ref 5.0–8.0)

## 2021-08-29 LAB — T3, FREE: T3, Free: 3.6 pg/mL (ref 2.3–4.2)

## 2021-08-29 LAB — T4, FREE: Free T4: 0.96 ng/dL (ref 0.60–1.60)

## 2021-08-29 LAB — TSH: TSH: 0.04 u[IU]/mL — ABNORMAL LOW (ref 0.35–5.50)

## 2021-08-29 MED ORDER — TIZANIDINE HCL 4 MG PO TABS: 4.0000 mg | ORAL_TABLET | Freq: Four times a day (QID) | ORAL | 2 refills | Status: DC | PRN

## 2021-08-29 NOTE — Assessment & Plan Note (Signed)
Acute Check UA, urine culture to rule out infection

## 2021-08-29 NOTE — Assessment & Plan Note (Signed)
Chronic Controlled, Stable Continue duloxetine 30 mg daily 

## 2021-08-29 NOTE — Assessment & Plan Note (Signed)
Chronic Check a1c Low sugar / carb diet Stressed regular exercise  

## 2021-08-29 NOTE — Assessment & Plan Note (Signed)
Chronic GERD controlled Continue omeprazole 20 mg daily  

## 2021-08-29 NOTE — Assessment & Plan Note (Signed)
New Seen on CT scan from December 2022 She does have a history of ovarian cancer so we will do close follow-up CT follow-up in 3 months, which would be next March or April at the latest depending on her schedule CT of the chest without contrast ordered

## 2021-08-29 NOTE — Assessment & Plan Note (Addendum)
Saw Dr Benjamine Mola - needs to go to [place] due to it be substernal-they will be the ones that would do the surgery Will check TFTs, but I expect thyroid function to be in the normal range

## 2021-08-29 NOTE — Assessment & Plan Note (Signed)
Chronic Blood pressure well controlled CMP Continue amlodipine 5 mg daily, losartan 50 mg daily 

## 2021-08-30 ENCOUNTER — Encounter: Payer: Self-pay | Admitting: Internal Medicine

## 2021-08-30 LAB — URINE CULTURE

## 2021-09-07 ENCOUNTER — Other Ambulatory Visit: Payer: Self-pay | Admitting: Family Medicine

## 2021-09-09 ENCOUNTER — Other Ambulatory Visit: Payer: Self-pay | Admitting: Family Medicine

## 2021-09-12 ENCOUNTER — Encounter: Payer: Self-pay | Admitting: Internal Medicine

## 2021-09-14 MED ORDER — MELOXICAM 15 MG PO TABS: 15.0000 mg | ORAL_TABLET | Freq: Every day | ORAL | 2 refills | Status: DC | PRN

## 2021-09-18 DIAGNOSIS — M25562 Pain in left knee: Secondary | ICD-10-CM | POA: Diagnosis not present

## 2021-09-18 DIAGNOSIS — Z9889 Other specified postprocedural states: Secondary | ICD-10-CM | POA: Diagnosis not present

## 2021-09-25 ENCOUNTER — Other Ambulatory Visit: Payer: Self-pay | Admitting: Family Medicine

## 2021-09-26 ENCOUNTER — Other Ambulatory Visit: Payer: Self-pay | Admitting: Family Medicine

## 2021-09-26 MED ORDER — GABAPENTIN 100 MG PO CAPS
200.0000 mg | ORAL_CAPSULE | Freq: Every day | ORAL | 3 refills | Status: DC
Start: 2021-09-26 — End: 2022-03-06

## 2021-09-28 NOTE — Progress Notes (Signed)
?Charlann Boxer D.O. ?[facility] ?[place] ?Phone: [phone] ?Subjective:   ?I, Alyssa [name], am serving as a Education administrator for Dr. Hulan Saas. ?This visit occurred during the SARS-CoV-2 public health emergency.  Safety protocols were in place, including screening questions prior to the visit, additional usage of staff PPE, and extensive cleaning of exam room while observing appropriate contact time as indicated for disinfecting solutions.  ? ?I'm seeing this patient by the request  of:  Binnie Rail, MD ? ?CC: Left knee pain and back pain follow-up ? ?[id number]  ?04/28/2021 ?1. Insufficiency fracture of tibia with delayed healing, subsequent encounter ?2. Acute pain of left knee ?- Chronic with exacerbation, no improvement, subsequent visit ?- Patient is experienced no improvement and thinks her knee pain may generally be worse since previous office visit ?- History of insufficiency fracture seen on MRI 09/2020, with repeat x-ray on 04/14/2021 showing no overt fracture and no dislocation ?- Based on HPI, physical exam, imaging, I feel that patient's insufficiency fracture and meniscal injuries may be the root cause of her pain ?- Patient wishes to continue with conservative therapy.  We will be more aggressive with complete nonweightbearing of left lower extremity using crutches for the next 2 weeks and then reevaluate to see if patient has any improvement.  At that time if she is improving, could consider a 4 to 6-week nonweightbearing to partial weightbearing status.  If no improvement at follow-up visit would consider referral to orthopedic surgery to discuss arthroscopic procedure versus total knee replacement ?- May continue meloxicam as needed for pain control ?- Continue RICE therapy ?  ?Pertinent previous records reviewed include knee mri 09/2020, xray 04/2021 ?  ?Follow Up: We will be more aggressive with complete nonweightbearing of left lower extremity using  crutches for the next 2 weeks and then reevaluate to see if patient has any improvement.  At that time if she is improving, could consider a 4 to 6-week nonweightbearing to partial weightbearing status.  If no improvement at follow-up visit would consider referral to orthopedic surgery to discuss arthroscopic procedure versus total knee replacement  ? ?Updated 10/03/2021 ?Alyssa [name] is a 61 y.o. female coming in with complaint of wanted meloxicam refilled. This was recently refilled by [facility]. Overall doing well. No new complaints. Just went back to work last week. Hasn't worked since last Oct. Hip and back hurt in the same place, but nothing new. ? ?Reviewing patient's chart patient did have a CT scan of the chest done recently that did show a thyroid nodule.  Patient had been referred from ENT but still does not have a treatment plan and is wondering what else is happening.  Past medical history significant for ovarian cancer. ? ?  ? ?Past Medical History:  ?Diagnosis Date  ? Arthritis   ? hands  ? GERD (gastroesophageal reflux disease)   ? Hypertension   ? 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 Pre-diabetes   ? SVD (spontaneous vaginal delivery)   ? x 3  ? ?Past Surgical History:  ?Procedure Laterality Date  ? ABDOMINAL HYSTERECTOMY    ? APPENDECTOMY    ? with hysterectomy  ? ENDOMETRIAL ABLATION  2002  ? LAPAROSCOPIC CHOLECYSTECTOMY  12/09/2016  ? 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  ? Procedure: LEFT TOTAL KNEE ARTHROPLASTY;  Surgeon: Melrose Nakayama, MD;  Location: [facility];  Service: Orthopedics;  Laterality: Left;  ? TUBAL LIGATION  1991  ? WISDOM  TOOTH EXTRACTION    ? ?Social History  ? ?Socioeconomic History  ? Marital status: Married  ?  Spouse name: Not on file  ? Number of children: 3  ? Years of education: Not on file  ? Highest education level: Not on file  ?Occupational History  ? Not on file  ?Tobacco Use  ? Smoking status: Never  ? Smokeless tobacco: Never  ?Vaping Use  ?  Vaping Use: Never used  ?Substance and Sexual Activity  ? Alcohol use: No  ? Drug use: No  ? Sexual activity: Yes  ?  Birth control/protection: Post-menopausal  ?  Comment: Hysterctomy  ?Other Topics Concern  ? Not on file  ?Social History Narrative  ? Not on file  ? ?Social Determinants of Health  ? ?Financial Resource Strain: Not on file  ?Food Insecurity: Not on file  ?Transportation Needs: Not on file  ?Physical Activity: Not on file  ?Stress: Not on file  ?Social Connections: Not on file  ? ?Allergies  ?Allergen Reactions  ? Codeine Itching  ? Doxycycline Other (See Comments)  ?  chest pain  ? Penicillins Nausea And Vomiting  ? Prednisone Anxiety  ?  severe anxiety  ? ?Family History  ?Problem Relation Age of Onset  ? Cancer Mother 51  ?     vulvar cancer  ? Lung cancer Mother 53  ? Stroke Father   ? Cancer - Lung Sister   ? Suicidality Sister   ? Suicidality Brother   ? Arthritis Brother   ? Prostate cancer Maternal Uncle   ? Prostate cancer Maternal Uncle   ? Stroke Maternal Grandmother   ? Prostate cancer Maternal Grandfather   ? Cervical cancer Paternal Grandmother   ? Throat cancer Paternal Grandfather   ? Colon cancer Neg Hx   ? Rectal cancer Neg Hx   ? Stomach cancer Neg Hx   ? ? ? ?Current Outpatient Medications (Cardiovascular):  ?  amLODipine (NORVASC) 5 MG tablet, TAKE 1 TABLET BY MOUTH  DAILY ?  losartan (COZAAR) 50 MG tablet, TAKE 1 TABLET BY MOUTH  DAILY ? ?Current Outpatient Medications (Respiratory):  ?  diphenhydrAMINE (BENADRYL) 25 MG tablet, Take 50 mg by mouth at bedtime as needed for sleep. ? ?Current Outpatient Medications (Analgesics):  ?  meloxicam (MOBIC) 15 MG tablet, Take 1 tablet (15 mg total) by mouth daily as needed for pain. ? ? ?Current Outpatient Medications (Other):  ?  Cholecalciferol (VITAMIN D3 PO), Take 5,000 Units by mouth every evening. ?  DULoxetine (CYMBALTA) 30 MG capsule, TAKE 1 CAPSULE BY MOUTH  DAILY ?  gabapentin (NEURONTIN) 100 MG capsule, Take 2 capsules (200 mg  total) by mouth at bedtime. ?  omeprazole (PRILOSEC) 20 MG capsule, TAKE 1 CAPSULE BY MOUTH  DAILY ?  tiZANidine (ZANAFLEX) 4 MG tablet, Take 1 tablet (4 mg total) by mouth every 6 (six) hours as needed for muscle spasms. ? ? ?Reviewed prior external information including notes and imaging from  ?primary care provider ?As well as notes that were available from care everywhere and other healthcare systems. ? ?Past medical history, social, surgical and family history all reviewed in electronic medical record.  No pertanent information unless stated regarding to the chief complaint.  ? ?Review of Systems: ? No headache, visual changes, nausea, vomiting, diarrhea, constipation, dizziness, abdominal pain, skin rash, fevers, chills, night sweats, weight loss, swollen lymph nodes, body aches, joint swelling, chest pain, shortness of breath, mood changes. POSITIVE muscle aches ? ?Objective  ?  Blood pressure 128/90, pulse 94, height '5\' 2"'$  (1.575 m), weight 197 lb (89.4 kg), SpO2 96 %. ?  ?General: No apparent distress alert and oriented x3 mood and affect normal, dressed appropriately.  ?HEENT: Pupils equal, extraocular movements intact  ?Respiratory: Patient's speak in full sentences and does not appear short of breath  ?Cardiovascular: No lower extremity edema, non tender, no erythema  ?Gait normal with good balance and coordination.  ?MSK: Patient neck exam does have some very mild enlargement of the thyroid noted.  Patient does have some tightness of the neck.  Low back exam does have some tightness noted right greater than left.  Positive FABER test noted.  Negative straight leg test. ? ?  ?Impression and Recommendations:  ?  ?The above documentation has been reviewed and is accurate and complete Lyndal Pulley, DO ? ? ? ?

## 2021-10-03 ENCOUNTER — Ambulatory Visit (INDEPENDENT_AMBULATORY_CARE_PROVIDER_SITE_OTHER): Payer: BC Managed Care – PPO | Admitting: Family Medicine

## 2021-10-03 VITALS — BP 128/90 | HR 94 | Ht 62.0 in | Wt 197.0 lb

## 2021-10-03 DIAGNOSIS — M1712 Unilateral primary osteoarthritis, left knee: Secondary | ICD-10-CM | POA: Diagnosis not present

## 2021-10-03 DIAGNOSIS — E041 Nontoxic single thyroid nodule: Secondary | ICD-10-CM | POA: Diagnosis not present

## 2021-10-03 NOTE — Assessment & Plan Note (Signed)
Enlarged, post sternal noted.  ?Will refer to general surgery to discuss with patient patient will be referred today. ?

## 2021-10-03 NOTE — Patient Instructions (Addendum)
Referral to Dr. Tera Helper ?See you again in 3 months ?

## 2021-10-03 NOTE — Assessment & Plan Note (Signed)
Had replacement.  Doing relatively well.  Can continue to monitor at this point.  Can follow-up with me in 3 months if needed.  Wants refill of the meloxicam by primary care provider. ?

## 2021-10-09 ENCOUNTER — Encounter: Payer: Self-pay | Admitting: Family Medicine

## 2021-10-12 ENCOUNTER — Other Ambulatory Visit: Payer: Self-pay | Admitting: Family Medicine

## 2021-10-16 NOTE — Telephone Encounter (Signed)
Sent patient MyChart message to see if she is using '300mg'$  in addition to '100mg'$  of gabapentin.  ?

## 2021-10-17 DIAGNOSIS — Z96659 Presence of unspecified artificial knee joint: Secondary | ICD-10-CM | POA: Diagnosis not present

## 2021-10-17 DIAGNOSIS — F419 Anxiety disorder, unspecified: Secondary | ICD-10-CM | POA: Diagnosis not present

## 2021-10-17 DIAGNOSIS — I10 Essential (primary) hypertension: Secondary | ICD-10-CM | POA: Diagnosis not present

## 2021-10-17 DIAGNOSIS — E042 Nontoxic multinodular goiter: Secondary | ICD-10-CM | POA: Diagnosis not present

## 2021-10-17 DIAGNOSIS — E049 Nontoxic goiter, unspecified: Secondary | ICD-10-CM | POA: Diagnosis not present

## 2021-10-17 DIAGNOSIS — Z880 Allergy status to penicillin: Secondary | ICD-10-CM | POA: Diagnosis not present

## 2021-10-17 DIAGNOSIS — R490 Dysphonia: Secondary | ICD-10-CM | POA: Diagnosis not present

## 2021-10-17 DIAGNOSIS — Z6834 Body mass index (BMI) 34.0-34.9, adult: Secondary | ICD-10-CM | POA: Diagnosis not present

## 2021-11-10 ENCOUNTER — Other Ambulatory Visit: Payer: Self-pay | Admitting: Family Medicine

## 2021-11-14 ENCOUNTER — Other Ambulatory Visit: Payer: Self-pay

## 2021-11-14 MED ORDER — TIZANIDINE HCL 4 MG PO TABS: 4.0000 mg | ORAL_TABLET | Freq: Every day | ORAL | 0 refills | Status: DC

## 2021-11-20 ENCOUNTER — Ambulatory Visit
Admission: RE | Admit: 2021-11-20 | Discharge: 2021-11-20 | Disposition: A | Discharge status: Home / Self Care | Payer: BC Managed Care – PPO | Source: Ambulatory Visit | Attending: Internal Medicine | Admitting: Internal Medicine

## 2021-11-20 DIAGNOSIS — R918 Other nonspecific abnormal finding of lung field: Secondary | ICD-10-CM | POA: Diagnosis not present

## 2021-11-20 DIAGNOSIS — R911 Solitary pulmonary nodule: Secondary | ICD-10-CM | POA: Diagnosis not present

## 2021-11-22 DIAGNOSIS — E049 Nontoxic goiter, unspecified: Secondary | ICD-10-CM | POA: Diagnosis not present

## 2021-11-23 ENCOUNTER — Other Ambulatory Visit: Payer: Self-pay | Admitting: Surgery

## 2021-11-23 ENCOUNTER — Encounter: Payer: Self-pay | Admitting: Internal Medicine

## 2021-11-23 DIAGNOSIS — E041 Nontoxic single thyroid nodule: Secondary | ICD-10-CM

## 2021-12-06 ENCOUNTER — Other Ambulatory Visit (HOSPITAL_COMMUNITY)
Admission: RE | Admit: 2021-12-06 | Discharge: 2021-12-06 | Disposition: A | Discharge status: Home / Self Care | Payer: BC Managed Care – PPO | Source: Ambulatory Visit | Attending: Surgery | Admitting: Surgery

## 2021-12-06 ENCOUNTER — Ambulatory Visit
Admission: RE | Admit: 2021-12-06 | Discharge: 2021-12-06 | Disposition: A | Discharge status: Home / Self Care | Payer: BC Managed Care – PPO | Source: Ambulatory Visit | Attending: Surgery | Admitting: Surgery

## 2021-12-06 DIAGNOSIS — E041 Nontoxic single thyroid nodule: Secondary | ICD-10-CM | POA: Diagnosis not present

## 2021-12-07 LAB — CYTOLOGY - NON PAP

## 2021-12-12 ENCOUNTER — Other Ambulatory Visit: Payer: Self-pay | Admitting: Family Medicine

## 2021-12-13 ENCOUNTER — Ambulatory Visit: Payer: Self-pay | Admitting: Surgery

## 2021-12-13 NOTE — Progress Notes (Signed)
Good news!  Biopsy is benign.  Therefore, I think we can do the "smaller" surgery and remove the middle part of the thyroid with the mass below the sternum, but leave the rest of the thyroid alone as we had discussed in the office.  I will enter orders today and send to the schedulers.  They will contact you to pick a date for surgery.  [place], MD [facility] [place] practice Office: [phone]

## 2021-12-29 NOTE — Progress Notes (Deleted)
[place] Phone: [phone] Subjective:    I'm seeing this patient by the request  of:  Binnie Rail, MD  CC:   [id number]  10/03/2021 Had replacement.  Doing relatively well.  Can continue to monitor at this point.  Can follow-up with me in 3 months if needed.  Wants refill of the meloxicam by primary care provider.  Update 01/09/2022 Alyssa [name] is a 61 y.o. female coming in with complaint of L knee pain. 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   Pre-diabetes    SVD (spontaneous vaginal delivery)    x 3   Past Surgical History:  Procedure Laterality Date   ABDOMINAL HYSTERECTOMY     APPENDECTOMY     with hysterectomy   ENDOMETRIAL ABLATION  2002   LAPAROSCOPIC CHOLECYSTECTOMY  12/09/2016   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   Procedure: LEFT TOTAL KNEE ARTHROPLASTY;  Surgeon: Melrose Nakayama, MD;  Location: [facility];  Service: Orthopedics;  Laterality: Left;   TUBAL LIGATION  1991   WISDOM TOOTH EXTRACTION     Social History   Socioeconomic History   Marital status: Married    Spouse name: Not on file   Number of children: 3   Years of education: Not on file   Highest education level: Not on file  Occupational History   Not on file  Tobacco Use   Smoking status: Never   Smokeless tobacco: Never  Vaping Use   Vaping Use: Never used  Substance and Sexual Activity   Alcohol use: No   Drug use: No   Sexual activity: Yes    Birth control/protection: Post-menopausal    Comment: Hysterctomy  Other Topics Concern   Not on file  Social History Narrative   Not on file   Social Determinants of Health   Financial Resource Strain: Not on file  Food Insecurity: Not on file  Transportation Needs: Not on file  Physical  Activity: Not on file  Stress: Not on file  Social Connections: Not on file   Allergies  Allergen Reactions   Codeine Itching   Doxycycline Other (See Comments)    chest pain   Penicillins Nausea And Vomiting   Prednisone Anxiety    severe anxiety   Family History  Problem Relation Age of Onset   Cancer Mother 56       vulvar cancer   Lung cancer Mother 13   Stroke Father    Cancer - Lung Sister    Suicidality Sister    Suicidality Brother    Arthritis Brother    Prostate cancer Maternal Uncle    Prostate cancer Maternal Uncle    Stroke Maternal Grandmother    Prostate cancer Maternal Grandfather    Cervical cancer Paternal Grandmother    Throat cancer Paternal Grandfather    Colon cancer Neg Hx    Rectal cancer Neg Hx    Stomach cancer Neg Hx      Current Outpatient Medications (Cardiovascular):    amLODipine (NORVASC) 5 MG tablet, TAKE 1 TABLET BY MOUTH  DAILY   losartan (COZAAR) 50 MG tablet, TAKE 1 TABLET BY MOUTH  DAILY  Current Outpatient Medications (Respiratory):    diphenhydrAMINE (BENADRYL) 25 MG tablet, Take  50 mg by mouth at bedtime as needed for sleep.  Current Outpatient Medications (Analgesics):    meloxicam (MOBIC) 15 MG tablet, Take 1 tablet (15 mg total) by mouth daily as needed for pain.   Current Outpatient Medications (Other):    Cholecalciferol (VITAMIN D3 PO), Take 5,000 Units by mouth every evening.   DULoxetine (CYMBALTA) 30 MG capsule, TAKE 1 CAPSULE BY MOUTH  DAILY   gabapentin (NEURONTIN) 100 MG capsule, Take 2 capsules (200 mg total) by mouth at bedtime.   gabapentin (NEURONTIN) 300 MG capsule, Take 1 capsule by mouth at bedtime   omeprazole (PRILOSEC) 20 MG capsule, TAKE 1 CAPSULE BY MOUTH  DAILY   tiZANidine (ZANAFLEX) 4 MG tablet, Take 1 tablet (4 mg total) by mouth every 6 (six) hours as needed for muscle spasms.   tiZANidine (ZANAFLEX) 4 MG tablet, Take 1 tablet (4 mg total) by mouth at bedtime.   Reviewed prior external  information including notes and imaging from  primary care provider As well as notes that were available from care everywhere and other healthcare systems.  Past medical history, social, surgical and family history all reviewed in electronic medical record.  No pertanent information unless stated regarding to the chief complaint.   Review of Systems:  No headache, visual changes, nausea, vomiting, diarrhea, constipation, dizziness, abdominal pain, skin rash, fevers, chills, night sweats, weight loss, swollen lymph nodes, body aches, joint swelling, chest pain, shortness of breath, mood changes. POSITIVE muscle aches  Objective  There were no vitals taken for this visit.   General: No apparent distress alert and oriented x3 mood and affect normal, dressed appropriately.  HEENT: Pupils equal, extraocular movements intact  Respiratory: Patient's speak in full sentences and does not appear short of breath  Cardiovascular: No lower extremity edema, non tender, no erythema      Impression and Recommendations:

## 2022-01-02 ENCOUNTER — Other Ambulatory Visit: Payer: Self-pay | Admitting: Family Medicine

## 2022-01-09 ENCOUNTER — Ambulatory Visit: Payer: BC Managed Care – PPO | Admitting: Family Medicine

## 2022-01-09 NOTE — Progress Notes (Addendum)
Anesthesia Review:  PCP: Alyssa [name]  Cardiologist : none  Chest x-ray : 01/12/2022 EKG : 06/08/21  11/21/21- Ct chest  Echo : Stress test: Cardiac Cath :  Activity level: can do a flight of stairs without difficutly  Sleep Study/ CPAP : none  Fasting Blood Sugar :      / Checks Blood Sugar -- times a day:   Blood Thinner/ Instructions /Last Dose: ASA / Instructions/ Last Dose :   Prediabetes- on no meds  Does not check glucose at home  Hgba1c-01/12/22- 5.5

## 2022-01-10 NOTE — Progress Notes (Signed)
DUE TO COVID-19 ONLY ONE VISITOR IS ALLOWED TO COME WITH YOU AND STAY IN THE WAITING ROOM ONLY DURING PRE OP AND PROCEDURE DAY OF SURGERY.  2 VISITOR  MAY VISIT WITH YOU AFTER SURGERY IN YOUR PRIVATE ROOM DURING VISITING HOURS ONLY! YOU MAY HAVE ONE PERSON SPEND THE NITE WITH YOU IN YOUR ROOM AFTER SURGERY.      Your procedure is scheduled on:        01/22/22   Report to [facility] Main  Entrance   Report to admitting at    0515             AM DO NOT [place], PICTURE ID OR WALLET DAY OF SURGERY.      Call this number if you have problems the morning of surgery [phone]    REMEMBER: NO  SOLID FOODS , CANDY, GUM OR MINTS AFTER [place] .       [place] Kitchen CLEAR LIQUIDS UNTIL     0430am           DAY OF SURGERY.      PLEASE FINISH  g 2 lower sugar DRINK PER SURGEON ORDER  WHICH NEEDS TO BE COMPLETED AT  0430am        MORNING OF SURGERY.       CLEAR LIQUID DIET   Foods Allowed      WATER BLACK COFFEE ( SUGAR OK, NO MILK, CREAM OR CREAMER) REGULAR AND DECAF  TEA ( SUGAR OK NO MILK, CREAM, OR CREAMER) REGULAR AND DECAF  PLAIN JELLO ( NO RED)  FRUIT ICES ( NO RED, NO FRUIT PULP)  POPSICLES ( NO RED)  JUICE- APPLE, WHITE GRAPE AND WHITE CRANBERRY  SPORT DRINK LIKE GATORADE ( NO RED)  CLEAR BROTH ( VEGETABLE , CHICKEN OR BEEF)                                                                     BRUSH YOUR TEETH MORNING OF SURGERY AND RINSE YOUR MOUTH OUT, NO CHEWING GUM CANDY OR MINTS.     Take these medicines the morning of surgery with A SIP OF WATER:  amlodipine, cymbalta, omeprazole    DO NOT TAKE ANY DIABETIC MEDICATIONS DAY OF YOUR SURGERY                               You may not have any metal on your body including hair pins and              piercings  Do not wear jewelry, make-up, lotions, powders or perfumes, deodorant             Do not wear nail polish on your fingernails.              IF YOU ARE A FEMALE AND WANT TO SHAVE UNDER  ARMS OR LEGS PRIOR TO SURGERY YOU MUST DO SO AT LEAST 48 HOURS PRIOR TO SURGERY.              Men may shave face and neck.   Do not bring valuables to the hospital. [place] IS NOT  RESPONSIBLE   FOR VALUABLES.  Contacts, dentures or bridgework may not be worn into surgery.  Leave suitcase in the car. After surgery it may be brought to your room.     Patients discharged the day of surgery will not be allowed to drive home. IF YOU ARE HAVING SURGERY AND GOING HOME THE SAME DAY, YOU MUST HAVE AN ADULT TO DRIVE YOU HOME AND BE WITH YOU FOR 24 HOURS. YOU MAY GO HOME BY TAXI OR UBER OR ORTHERWISE, BUT AN ADULT MUST ACCOMPANY YOU HOME AND STAY WITH YOU FOR 24 HOURS.                Please read over the following fact sheets you were given: _____________________________________________________________________  [facility] - Preparing for Surgery Before surgery, you can play an important role.  Because skin is not sterile, your skin needs to be as free of germs as possible.  You can reduce the number of germs on your skin by washing with CHG (chlorahexidine gluconate) soap before surgery.  CHG is an antiseptic cleaner which kills germs and bonds with the skin to continue killing germs even after washing. Please DO NOT use if you have an allergy to CHG or antibacterial soaps.  If your skin becomes reddened/irritated stop using the CHG and inform your nurse when you arrive at Short Stay. Do not shave (including legs and underarms) for at least 48 hours prior to the first CHG shower.  You may shave your face/neck. Please follow these instructions carefully:  1.  Shower with CHG Soap the night before surgery and the  morning of Surgery.  2.  If you choose to wash your hair, wash your hair first as usual with your  normal  shampoo.  3.  After you shampoo, rinse your hair and body thoroughly to remove the  shampoo.                           4.  Use CHG as you would any other liquid soap.  You  can apply chg directly  to the skin and wash                       Gently with a scrungie or clean washcloth.  5.  Apply the CHG Soap to your body ONLY FROM THE NECK DOWN.   Do not use on face/ open                           Wound or open sores. Avoid contact with eyes, ears mouth and genitals (private parts).                       Wash face,  Genitals (private parts) with your normal soap.             6.  Wash thoroughly, paying special attention to the area where your surgery  will be performed.  7.  Thoroughly rinse your body with warm water from the neck down.  8.  DO NOT shower/wash with your normal soap after using and rinsing off  the CHG Soap.                9.  Pat yourself dry with a clean towel.            10.  Wear clean pajamas.  11.  Place clean sheets on your bed the night of your first shower and do not  sleep with pets. Day of Surgery : Do not apply any lotions/deodorants the morning of surgery.  Please wear clean clothes to the hospital/surgery center.  FAILURE TO FOLLOW THESE INSTRUCTIONS MAY RESULT IN THE CANCELLATION OF YOUR SURGERY PATIENT SIGNATURE_________________________________  NURSE SIGNATURE__________________________________  ________________________________________________________________________

## 2022-01-12 ENCOUNTER — Ambulatory Visit (HOSPITAL_COMMUNITY)
Admission: RE | Admit: 2022-01-12 | Discharge: 2022-01-12 | Disposition: A | Discharge status: Home / Self Care | Payer: BC Managed Care – PPO | Source: Ambulatory Visit | Attending: Anesthesiology | Admitting: Anesthesiology

## 2022-01-12 ENCOUNTER — Encounter (HOSPITAL_COMMUNITY): Payer: Self-pay

## 2022-01-12 ENCOUNTER — Other Ambulatory Visit: Payer: Self-pay

## 2022-01-12 ENCOUNTER — Encounter (HOSPITAL_COMMUNITY)
Admission: RE | Admit: 2022-01-12 | Discharge: 2022-01-12 | Disposition: A | Discharge status: Home / Self Care | Payer: BC Managed Care – PPO | Source: Ambulatory Visit | Attending: Surgery | Admitting: Surgery

## 2022-01-12 VITALS — BP 142/90 | HR 74 | Temp 98.2°F | Resp 16 | Ht 61.0 in | Wt 193.0 lb

## 2022-01-12 DIAGNOSIS — Z01812 Encounter for preprocedural laboratory examination: Secondary | ICD-10-CM | POA: Diagnosis not present

## 2022-01-12 DIAGNOSIS — Z01818 Encounter for other preprocedural examination: Secondary | ICD-10-CM

## 2022-01-12 DIAGNOSIS — R911 Solitary pulmonary nodule: Secondary | ICD-10-CM | POA: Diagnosis not present

## 2022-01-12 HISTORY — DX: Anxiety disorder, unspecified: F41.9

## 2022-01-12 HISTORY — DX: Depression, unspecified: F32.A

## 2022-01-12 LAB — CBC
HCT: 45.3 % (ref 36.0–46.0)
Hemoglobin: 15.1 g/dL — ABNORMAL HIGH (ref 12.0–15.0)
MCH: 30.8 pg (ref 26.0–34.0)
MCHC: 33.3 g/dL (ref 30.0–36.0)
MCV: 92.3 fL (ref 80.0–100.0)
Platelets: 250 10*3/uL (ref 150–400)
RBC: 4.91 MIL/uL (ref 3.87–5.11)
RDW: 12.8 % (ref 11.5–15.5)
WBC: 3.3 10*3/uL — ABNORMAL LOW (ref 4.0–10.5)
nRBC: 0 % (ref 0.0–0.2)

## 2022-01-12 LAB — BASIC METABOLIC PANEL
Anion gap: 7 (ref 5–15)
BUN: 26 mg/dL — ABNORMAL HIGH (ref 6–20)
CO2: 26 mmol/L (ref 22–32)
Calcium: 9.5 mg/dL (ref 8.9–10.3)
Chloride: 105 mmol/L (ref 98–111)
Creatinine, Ser: 1.19 mg/dL — ABNORMAL HIGH (ref 0.44–1.00)
GFR, Estimated: 53 mL/min — ABNORMAL LOW (ref >60)
Glucose, Bld: 96 mg/dL (ref 70–99)
Potassium: 4.4 mmol/L (ref 3.5–5.1)
Sodium: 138 mmol/L (ref 135–145)

## 2022-01-12 LAB — HEMOGLOBIN A1C
Hgb A1c MFr Bld: 5.5 % (ref 4.8–5.6)
Mean Plasma Glucose: 111.15 mg/dL

## 2022-01-12 LAB — GLUCOSE, CAPILLARY: Glucose-Capillary: 105 mg/dL — ABNORMAL HIGH (ref 70–99)

## 2022-01-14 ENCOUNTER — Encounter (HOSPITAL_COMMUNITY): Payer: Self-pay | Admitting: Surgery

## 2022-01-14 DIAGNOSIS — E049 Nontoxic goiter, unspecified: Secondary | ICD-10-CM | POA: Diagnosis present

## 2022-01-14 NOTE — H&P (Signed)
REFERRING PHYSICIAN: Lyndal Pulley, DO  PROVIDER: Laruen Risser Charlotta Newton, MD   Chief Complaint: New Consultation (Substernal thyroid goiter)  History of Present Illness:  Patient is referred by Dr. Hulan Saas for surgical evaluation and management of newly diagnosed thyroid nodules. Patient had undergone a CT scan of the chest after having had an abnormal preoperative chest x-ray. CT scan demonstrated a large exophytic mass extending from the thyroid isthmus into the anterior mediastinum measuring up to 6.5 cm in size. Subsequent ultrasound was performed on June 30, 2021. This showed a moderately heterogeneous thyroid gland with the right and left thyroid lobes being essentially normal. There was a 2.2 cm nodule in the mid thyroid isthmus. There was a exophytic mass measuring 5.8 cm below the isthmus in the midline extending into the anterior mediastinum. Fine-needle aspiration biopsy was recommended. Patient has never been on thyroid medication. She has had no prior head or neck surgery. She denies any compressive symptoms with the exception of recent mild hoarseness of voice and some air hunger upon lying recumbent at night. There is no family history of thyroid disease. Patient works as a Freight forwarder for a Teacher, adult education at [employer]. Patient has been evaluated by ENT both here in [place] and also at [facility]. Their current recommendation was observation.  Review of Systems: A complete review of systems was obtained from the patient. I have reviewed this information and discussed as appropriate with the patient. See HPI as well for other ROS.  Review of Systems  Constitutional: Negative.  HENT: Negative.  Eyes: Negative.  Respiratory: Positive for shortness of breath.  Cardiovascular: Negative.  Gastrointestinal: Negative.  Genitourinary: Negative.  Musculoskeletal: Negative.  Skin: Negative.  Neurological: Negative.  Endo/Heme/Allergies: Negative.   Psychiatric/Behavioral: Negative.    Medical History: History reviewed. No pertinent past medical history.  There is no problem list on file for this patient.  History reviewed. No pertinent surgical history.   Allergies  Allergen Reactions   Doxycycline Other (See Comments) and Palpitations  Chest pain Per pt, she gets chest pain from doxycycline chest pain   Codeine Itching and Rash   Current Outpatient Medications on File Prior to Visit  Medication Sig Dispense Refill   amLODIPine (NORVASC) 5 MG tablet Take 1 tablet by mouth once daily   DULoxetine (CYMBALTA) 30 MG DR capsule   losartan (COZAAR) 50 MG tablet Take 1 tablet by mouth once daily   omeprazole (PRILOSEC) 20 MG DR capsule Take 1 capsule by mouth once daily   cholecalciferol (VITAMIN D3) 1000 unit tablet Take by mouth   diphenhydrAMINE (BENADRYL) 25 mg tablet Take by mouth   gabapentin (NEURONTIN) 100 MG capsule Take 300 mg by mouth 3 (three) times daily   meloxicam (MOBIC) 15 MG tablet Take 15 mg by mouth once daily as needed   tiZANidine (ZANAFLEX) 4 MG capsule Take by mouth   No current facility-administered medications on file prior to visit.   No family history on file.   Social History   Tobacco Use  Smoking Status Not on file  Smokeless Tobacco Not on file    Social History   Socioeconomic History   Marital status: Married   Objective:   Vitals:  BP: 132/88  Pulse: 97  Temp: 37.1 C (98.7 F)  SpO2: 99%  Weight: 90.2 kg (198 lb 12.8 oz)  Height: 157.5 cm ('5\' 2"'$ )   Body mass index is 36.36 kg/m.  Physical Exam   GENERAL APPEARANCE Comfortable, no  acute issues Development: normal Gross deformities: none  SKIN Rash, lesions, ulcers: none Induration, erythema: none Nodules: none palpable  EYES Conjunctiva and lids: normal Pupils: equal and reactive  EARS, NOSE, MOUTH, THROAT External ears: no lesion or deformity External nose: no lesion or deformity Hearing: grossly  normal  NECK Symmetric: yes Trachea: midline Thyroid: There is a smooth, slightly firm, mobile nodule in the mid isthmus measuring about 2 cm in size. No significant nodularity in either the right or left thyroid lobes. There is fullness in the sternal notch but the dominant mass is not palpable on exam. There is no palpable lymphadenopathy.  CHEST Respiratory effort: normal Retraction or accessory muscle use: no Breath sounds: normal bilaterally Rales, rhonchi, wheeze: none  CARDIOVASCULAR Auscultation: regular rhythm, normal rate Murmurs: none Pulses: radial pulse 2+ palpable Lower extremity edema: none  ABDOMEN Not assessed  GENITOURINARY Not assessed  MUSCULOSKELETAL Station and gait: normal Digits and nails: no clubbing or cyanosis Muscle strength: grossly normal all extremities Range of motion: grossly normal all extremities Deformity: none  LYMPHATIC Cervical: none palpable Supraclavicular: none palpable  PSYCHIATRIC Oriented to person, place, and time: yes Mood and affect: normal for situation Judgment and insight: appropriate for situation    Assessment and Plan:   Substernal thyroid goiter  Patient is referred by Dr. Hulan Saas for surgical evaluation and management of a substernal thyroid mass.  Patient provided with a copy of "The Thyroid Book: Medical and Surgical Treatment of Thyroid Problems", published by [employer], 16 pages. Book reviewed and explained to patient during visit today.  Patient has a relatively large mass in the anterior mediastinum which appears to be arising from the thyroid isthmus. She also has a small 2.2 cm nodule in the thyroid isthmus. Right and left thyroid lobes appear relatively normal. Patient does have mild compressive symptoms.  I have recommended proceeding with fine-needle aspiration biopsy of the dominant mass in the anterior mediastinum. We will arrange for this study in the near future.  If the biopsy is benign,  then the patient may be a candidate for resection of the thyroid isthmus and anterior mediastinal mass as a limited procedure, leaving the bilateral thyroid lobes in their normal anatomic position and avoiding the risk to the recurrent nerves and parathyroid glands. If the biopsy shows evidence of malignancy, then I believe she will require total thyroidectomy. Today we discussed these procedures. We discussed the risk and benefits of surgery including the risk of recurrent laryngeal nerve injury and injury to parathyroid glands. We discussed the size and location of the surgical incision. We discussed the hospital stay to be anticipated. We discussed her postoperative recovery and return to work and activities. The patient understands and agrees to proceed with fine-needle aspiration biopsy. Once those results are available, we will discuss further management.   Armandina Gemma, MD [facility] Surgery A [place] practice Office: [phone]

## 2022-01-22 ENCOUNTER — Ambulatory Visit (HOSPITAL_COMMUNITY): Payer: BC Managed Care – PPO | Admitting: Registered Nurse

## 2022-01-22 ENCOUNTER — Ambulatory Visit (HOSPITAL_COMMUNITY): Payer: BC Managed Care – PPO | Admitting: Physician Assistant

## 2022-01-22 ENCOUNTER — Other Ambulatory Visit: Payer: Self-pay

## 2022-01-22 ENCOUNTER — Encounter (HOSPITAL_COMMUNITY)
Admission: RE | Disposition: A | Discharge status: Home / Self Care | Payer: Self-pay | Source: Ambulatory Visit | Attending: Surgery

## 2022-01-22 ENCOUNTER — Ambulatory Visit (HOSPITAL_COMMUNITY)
Admission: RE | Admit: 2022-01-22 | Discharge: 2022-01-23 | Disposition: A | Discharge status: Home / Self Care | Payer: BC Managed Care – PPO | Source: Ambulatory Visit | Attending: Surgery | Admitting: Surgery

## 2022-01-22 ENCOUNTER — Encounter (HOSPITAL_COMMUNITY): Payer: Self-pay | Admitting: Surgery

## 2022-01-22 DIAGNOSIS — Z01818 Encounter for other preprocedural examination: Secondary | ICD-10-CM

## 2022-01-22 DIAGNOSIS — I10 Essential (primary) hypertension: Secondary | ICD-10-CM | POA: Diagnosis not present

## 2022-01-22 DIAGNOSIS — E042 Nontoxic multinodular goiter: Secondary | ICD-10-CM | POA: Diagnosis not present

## 2022-01-22 DIAGNOSIS — K219 Gastro-esophageal reflux disease without esophagitis: Secondary | ICD-10-CM | POA: Insufficient documentation

## 2022-01-22 DIAGNOSIS — E049 Nontoxic goiter, unspecified: Secondary | ICD-10-CM | POA: Diagnosis present

## 2022-01-22 DIAGNOSIS — E048 Other specified nontoxic goiter: Secondary | ICD-10-CM | POA: Diagnosis not present

## 2022-01-22 DIAGNOSIS — E041 Nontoxic single thyroid nodule: Secondary | ICD-10-CM | POA: Diagnosis present

## 2022-01-22 HISTORY — PX: THYROID LOBECTOMY: SHX420

## 2022-01-22 LAB — GLUCOSE, CAPILLARY: Glucose-Capillary: 103 mg/dL — ABNORMAL HIGH (ref 70–99)

## 2022-01-22 SURGERY — LOBECTOMY, THYROID
Anesthesia: General

## 2022-01-22 MED ORDER — PROMETHAZINE HCL 25 MG/ML IJ SOLN
INTRAMUSCULAR | Status: AC
  Administered 2022-01-22: 6.25 mg via INTRAVENOUS
  Filled 2022-01-22: qty 1

## 2022-01-22 MED ORDER — CHLORHEXIDINE GLUCONATE CLOTH 2 % EX PADS: 6.0000 | MEDICATED_PAD | Freq: Once | CUTANEOUS | Status: DC

## 2022-01-22 MED ORDER — OXYCODONE HCL 5 MG PO TABS
5.0000 mg | ORAL_TABLET | ORAL | Status: DC | PRN
  Administered 2022-01-22: 5 mg via ORAL
  Administered 2022-01-23 (×2): 10 mg via ORAL
  Filled 2022-01-22: qty 2
  Filled 2022-01-22: qty 1
  Filled 2022-01-22: qty 2

## 2022-01-22 MED ORDER — HYDROMORPHONE HCL 1 MG/ML IJ SOLN: 1.0000 mg | INTRAMUSCULAR | Status: DC | PRN

## 2022-01-22 MED ORDER — PHENYLEPHRINE HCL (PRESSORS) 10 MG/ML IV SOLN
INTRAVENOUS | Status: AC
  Filled 2022-01-22: qty 1

## 2022-01-22 MED ORDER — DULOXETINE HCL 30 MG PO CPEP
30.0000 mg | ORAL_CAPSULE | Freq: Every day | ORAL | Status: DC
  Administered 2022-01-22: 30 mg via ORAL
  Filled 2022-01-22: qty 1

## 2022-01-22 MED ORDER — DIPHENHYDRAMINE HCL 25 MG PO TABS
50.0000 mg | ORAL_TABLET | Freq: Every evening | ORAL | Status: DC | PRN
Start: 2022-01-22 — End: 2022-01-23

## 2022-01-22 MED ORDER — GABAPENTIN 100 MG PO CAPS: 200.0000 mg | ORAL_CAPSULE | Freq: Every day | ORAL | Status: DC

## 2022-01-22 MED ORDER — DEXAMETHASONE SODIUM PHOSPHATE 10 MG/ML IJ SOLN
INTRAMUSCULAR | Status: DC | PRN
  Administered 2022-01-22: 8 mg via INTRAVENOUS

## 2022-01-22 MED ORDER — ACETAMINOPHEN 325 MG PO TABS
650.0000 mg | ORAL_TABLET | Freq: Four times a day (QID) | ORAL | Status: DC | PRN
  Administered 2022-01-22: 650 mg via ORAL
  Filled 2022-01-22: qty 2

## 2022-01-22 MED ORDER — ACETAMINOPHEN 10 MG/ML IV SOLN: 1000.0000 mg | Freq: Once | INTRAVENOUS | Status: DC | PRN

## 2022-01-22 MED ORDER — 0.9 % SODIUM CHLORIDE (POUR BTL) OPTIME
TOPICAL | Status: DC | PRN
  Administered 2022-01-22: 1000 mL

## 2022-01-22 MED ORDER — ACETAMINOPHEN 500 MG PO TABS: 1000.0000 mg | ORAL_TABLET | Freq: Once | ORAL | Status: DC | PRN

## 2022-01-22 MED ORDER — PROMETHAZINE HCL 25 MG/ML IJ SOLN: 6.2500 mg | INTRAMUSCULAR | Status: DC | PRN

## 2022-01-22 MED ORDER — CHLORHEXIDINE GLUCONATE 0.12 % MT SOLN
15.0000 mL | Freq: Once | OROMUCOSAL | Status: AC
  Administered 2022-01-22: 15 mL via OROMUCOSAL

## 2022-01-22 MED ORDER — TRAMADOL HCL 50 MG PO TABS: 50.0000 mg | ORAL_TABLET | Freq: Four times a day (QID) | ORAL | Status: DC | PRN

## 2022-01-22 MED ORDER — ROCURONIUM BROMIDE 10 MG/ML (PF) SYRINGE
PREFILLED_SYRINGE | INTRAVENOUS | Status: DC | PRN
  Administered 2022-01-22: 60 mg via INTRAVENOUS
  Administered 2022-01-22: 10 mg via INTRAVENOUS

## 2022-01-22 MED ORDER — PROPOFOL 10 MG/ML IV BOLUS
INTRAVENOUS | Status: DC | PRN
  Administered 2022-01-22: 170 mg via INTRAVENOUS

## 2022-01-22 MED ORDER — ONDANSETRON HCL 4 MG/2ML IJ SOLN
INTRAMUSCULAR | Status: DC | PRN
  Administered 2022-01-22: 4 mg via INTRAVENOUS

## 2022-01-22 MED ORDER — ONDANSETRON HCL 4 MG/2ML IJ SOLN
INTRAMUSCULAR | Status: AC
  Filled 2022-01-22: qty 2

## 2022-01-22 MED ORDER — MIDAZOLAM HCL 2 MG/2ML IJ SOLN
INTRAMUSCULAR | Status: AC
  Filled 2022-01-22: qty 2

## 2022-01-22 MED ORDER — PHENYLEPHRINE 80 MCG/ML (10ML) SYRINGE FOR IV PUSH (FOR BLOOD PRESSURE SUPPORT)
PREFILLED_SYRINGE | INTRAVENOUS | Status: DC | PRN
  Administered 2022-01-22 (×2): 80 ug via INTRAVENOUS

## 2022-01-22 MED ORDER — FENTANYL CITRATE PF 50 MCG/ML IJ SOSY
PREFILLED_SYRINGE | INTRAMUSCULAR | Status: AC
  Administered 2022-01-22: 50 ug via INTRAVENOUS
  Filled 2022-01-22: qty 3

## 2022-01-22 MED ORDER — PANTOPRAZOLE SODIUM 40 MG PO TBEC: 40.0000 mg | DELAYED_RELEASE_TABLET | Freq: Every day | ORAL | Status: DC

## 2022-01-22 MED ORDER — PROPOFOL 10 MG/ML IV BOLUS
INTRAVENOUS | Status: AC
  Filled 2022-01-22: qty 20

## 2022-01-22 MED ORDER — CIPROFLOXACIN IN D5W 400 MG/200ML IV SOLN
400.0000 mg | INTRAVENOUS | Status: AC
  Administered 2022-01-22: 400 mg via INTRAVENOUS
  Filled 2022-01-22: qty 200

## 2022-01-22 MED ORDER — SODIUM CHLORIDE 0.45 % IV SOLN
INTRAVENOUS | Status: DC
Start: 2022-01-22 — End: 2022-01-23

## 2022-01-22 MED ORDER — MIDAZOLAM HCL 5 MG/5ML IJ SOLN
INTRAMUSCULAR | Status: DC | PRN
  Administered 2022-01-22: 2 mg via INTRAVENOUS

## 2022-01-22 MED ORDER — OXYCODONE HCL 5 MG PO TABS: 5.0000 mg | ORAL_TABLET | Freq: Once | ORAL | Status: DC | PRN

## 2022-01-22 MED ORDER — GABAPENTIN 300 MG PO CAPS
300.0000 mg | ORAL_CAPSULE | Freq: Every day | ORAL | Status: DC
  Administered 2022-01-22: 300 mg via ORAL
  Filled 2022-01-22: qty 1

## 2022-01-22 MED ORDER — FENTANYL CITRATE (PF) 100 MCG/2ML IJ SOLN
INTRAMUSCULAR | Status: DC | PRN
  Administered 2022-01-22 (×4): 50 ug via INTRAVENOUS

## 2022-01-22 MED ORDER — FENTANYL CITRATE (PF) 100 MCG/2ML IJ SOLN
INTRAMUSCULAR | Status: AC
  Filled 2022-01-22: qty 2

## 2022-01-22 MED ORDER — ACETAMINOPHEN 10 MG/ML IV SOLN
INTRAVENOUS | Status: AC
  Filled 2022-01-22: qty 100

## 2022-01-22 MED ORDER — ACETAMINOPHEN 650 MG RE SUPP: 650.0000 mg | Freq: Four times a day (QID) | RECTAL | Status: DC | PRN

## 2022-01-22 MED ORDER — ONDANSETRON HCL 4 MG/2ML IJ SOLN
4.0000 mg | Freq: Four times a day (QID) | INTRAMUSCULAR | Status: DC | PRN
  Administered 2022-01-22: 4 mg via INTRAVENOUS
  Filled 2022-01-22: qty 2

## 2022-01-22 MED ORDER — ACETAMINOPHEN 160 MG/5ML PO SOLN: 1000.0000 mg | Freq: Once | ORAL | Status: DC | PRN

## 2022-01-22 MED ORDER — ONDANSETRON 4 MG PO TBDP: 4.0000 mg | ORAL_TABLET | Freq: Four times a day (QID) | ORAL | Status: DC | PRN

## 2022-01-22 MED ORDER — LIDOCAINE HCL (PF) 2 % IJ SOLN
INTRAMUSCULAR | Status: AC
  Filled 2022-01-22: qty 5

## 2022-01-22 MED ORDER — AMLODIPINE BESYLATE 5 MG PO TABS: 5.0000 mg | ORAL_TABLET | Freq: Every day | ORAL | Status: DC

## 2022-01-22 MED ORDER — PHENYLEPHRINE 80 MCG/ML (10ML) SYRINGE FOR IV PUSH (FOR BLOOD PRESSURE SUPPORT)
PREFILLED_SYRINGE | INTRAVENOUS | Status: AC
  Filled 2022-01-22: qty 10

## 2022-01-22 MED ORDER — LACTATED RINGERS IV SOLN: INTRAVENOUS | Status: DC

## 2022-01-22 MED ORDER — ROCURONIUM BROMIDE 10 MG/ML (PF) SYRINGE
PREFILLED_SYRINGE | INTRAVENOUS | Status: AC
  Filled 2022-01-22: qty 10

## 2022-01-22 MED ORDER — ACETAMINOPHEN 10 MG/ML IV SOLN
INTRAVENOUS | Status: DC | PRN
  Administered 2022-01-22: 1000 mg via INTRAVENOUS

## 2022-01-22 MED ORDER — DEXAMETHASONE SODIUM PHOSPHATE 10 MG/ML IJ SOLN
INTRAMUSCULAR | Status: AC
  Filled 2022-01-22: qty 1

## 2022-01-22 MED ORDER — FENTANYL CITRATE PF 50 MCG/ML IJ SOSY
25.0000 ug | PREFILLED_SYRINGE | INTRAMUSCULAR | Status: DC | PRN
  Administered 2022-01-22 (×2): 50 ug via INTRAVENOUS

## 2022-01-22 MED ORDER — OXYCODONE HCL 5 MG/5ML PO SOLN: 5.0000 mg | Freq: Once | ORAL | Status: DC | PRN

## 2022-01-22 MED ORDER — LOSARTAN POTASSIUM 50 MG PO TABS
50.0000 mg | ORAL_TABLET | Freq: Every day | ORAL | Status: DC
Start: 2022-01-22 — End: 2022-01-23
  Administered 2022-01-22: 50 mg via ORAL
  Filled 2022-01-22: qty 1

## 2022-01-22 MED ORDER — LIDOCAINE 2% (20 MG/ML) 5 ML SYRINGE
INTRAMUSCULAR | Status: DC | PRN
  Administered 2022-01-22: 80 mg via INTRAVENOUS

## 2022-01-22 MED ORDER — ORAL CARE MOUTH RINSE: 15.0000 mL | Freq: Once | OROMUCOSAL | Status: AC

## 2022-01-22 SURGICAL SUPPLY — 33 items
ATTRACTOMAT 16X20 MAGNETIC DRP (DRAPES) ×2 IMPLANT
BAG COUNTER SPONGE SURGICOUNT (BAG) ×2 IMPLANT
BLADE SURG 15 STRL LF DISP TIS (BLADE) ×1 IMPLANT
BLADE SURG 15 STRL SS (BLADE) ×1
CHLORAPREP W/TINT 26 (MISCELLANEOUS) ×2 IMPLANT
CLIP TI MEDIUM 6 (CLIP) ×5 IMPLANT
CLIP TI WIDE RED SMALL 6 (CLIP) ×4 IMPLANT
COVER SURGICAL LIGHT HANDLE (MISCELLANEOUS) ×2 IMPLANT
DERMABOND ADVANCED (GAUZE/BANDAGES/DRESSINGS) ×1
DERMABOND ADVANCED .7 DNX12 (GAUZE/BANDAGES/DRESSINGS) ×1 IMPLANT
DRAPE LAPAROTOMY T 98X78 PEDS (DRAPES) ×2 IMPLANT
DRAPE UTILITY XL STRL (DRAPES) ×2 IMPLANT
ELECT PENCIL ROCKER SW 15FT (MISCELLANEOUS) ×2 IMPLANT
ELECT REM PT RETURN 15FT ADLT (MISCELLANEOUS) ×2 IMPLANT
GAUZE 4X4 16PLY ~~LOC~~+RFID DBL (SPONGE) ×2 IMPLANT
GLOVE SURG ORTHO 8.0 STRL STRW (GLOVE) ×2 IMPLANT
GLOVE SURG SYN 7.5  E (GLOVE) ×3
GLOVE SURG SYN 7.5 E (GLOVE) ×3 IMPLANT
GLOVE SURG SYN 7.5 PF PI (GLOVE) ×3 IMPLANT
GOWN STRL REUS W/ TWL XL LVL3 (GOWN DISPOSABLE) ×2 IMPLANT
GOWN STRL REUS W/TWL XL LVL3 (GOWN DISPOSABLE) ×2
HEMOSTAT SURGICEL 2X4 FIBR (HEMOSTASIS) ×2 IMPLANT
ILLUMINATOR WAVEGUIDE N/F (MISCELLANEOUS) ×2 IMPLANT
KIT BASIN OR (CUSTOM PROCEDURE TRAY) ×2 IMPLANT
KIT TURNOVER KIT A (KITS) IMPLANT
PACK BASIC VI WITH GOWN DISP (CUSTOM PROCEDURE TRAY) ×2 IMPLANT
SHEARS HARMONIC 9CM CVD (BLADE) ×2 IMPLANT
SUT MNCRL AB 4-0 PS2 18 (SUTURE) ×2 IMPLANT
SUT VIC AB 3-0 SH 18 (SUTURE) ×4 IMPLANT
SYR BULB IRRIG 60ML STRL (SYRINGE) ×2 IMPLANT
TOWEL OR 17X26 10 PK STRL BLUE (TOWEL DISPOSABLE) ×2 IMPLANT
TOWEL OR NON WOVEN STRL DISP B (DISPOSABLE) ×2 IMPLANT
TUBING CONNECTING 10 (TUBING) ×2 IMPLANT

## 2022-01-22 NOTE — Anesthesia Procedure Notes (Signed)
Procedure Name: Intubation Date/Time: 01/22/2022 7:28 AM  Performed by: Victoriano Lain, CRNAPre-anesthesia Checklist: Patient identified, Emergency Drugs available, Suction available, Patient being monitored and Timeout performed Patient Re-evaluated:Patient Re-evaluated prior to induction Oxygen Delivery Method: Circle system utilized Preoxygenation: Pre-oxygenation with 100% oxygen Induction Type: IV induction Ventilation: Mask ventilation without difficulty Laryngoscope Size: 4 Grade View: Grade I Tube type: Oral Tube size: 7.0 mm Number of attempts: 1 Airway Equipment and Method: Stylet Placement Confirmation: ETT inserted through vocal cords under direct vision, positive ETCO2 and breath sounds checked- equal and bilateral Secured at: 21 cm Tube secured with: Tape Dental Injury: Teeth and Oropharynx as per pre-operative assessment

## 2022-01-22 NOTE — Anesthesia Preprocedure Evaluation (Signed)
Anesthesia Evaluation  Patient identified by MRN, date of birth, ID band Patient awake    Reviewed: Allergy & Precautions, NPO status , Patient's Chart, lab work & pertinent test results  History of Anesthesia Complications Negative for: history of anesthetic complications  Airway Mallampati: I  TM Distance: >3 FB Neck ROM: Full    Dental  (+) Dental Advisory Given, Teeth Intact,    Pulmonary neg pulmonary ROS,    breath sounds clear to auscultation       Cardiovascular hypertension, Pt. on medications (-) angina(-) Past MI and (-) CHF (-) dysrhythmias  Rhythm:Regular     Neuro/Psych PSYCHIATRIC DISORDERS Anxiety Depression  Neuromuscular disease    GI/Hepatic Neg liver ROS, GERD  Medicated,  Endo/Other  negative endocrine ROSLab Results      Component                Value               Date                      HGBA1C                   5.5                 01/12/2022             Renal/GU negative Renal ROS     Musculoskeletal  (+) Arthritis ,   Abdominal   Peds  Hematology negative hematology ROS (+) Lab Results      Component                Value               Date                      WBC                      3.3 (L)             01/12/2022                HGB                      15.1 (H)            01/12/2022                HCT                      45.3                01/12/2022                MCV                      92.3                01/12/2022                PLT                      250                 01/12/2022              Anesthesia Other Findings   Reproductive/Obstetrics  Anesthesia Physical Anesthesia Plan  ASA: 2  Anesthesia Plan: General   Post-op Pain Management: Ofirmev IV (intra-op)*   Induction: Intravenous  PONV Risk Score and Plan: 3 and Ondansetron, Dexamethasone and Midazolam  Airway Management Planned: Oral ETT  Additional  Equipment: None  Intra-op Plan:   Post-operative Plan:   Informed Consent: I have reviewed the patients History and Physical, chart, labs and discussed the procedure including the risks, benefits and alternatives for the proposed anesthesia with the patient or authorized representative who has indicated his/her understanding and acceptance.     Dental advisory given  Plan Discussed with: CRNA  Anesthesia Plan Comments:         Anesthesia Quick Evaluation

## 2022-01-22 NOTE — Plan of Care (Signed)
  Problem: Education: Goal: Knowledge of General Education information will improve Description: Including pain rating scale, medication(s)/side effects and non-pharmacologic comfort measures Outcome: Progressing   Problem: Activity: Goal: Risk for activity intolerance will decrease Outcome: Progressing   Problem: Nutrition: Goal: Adequate nutrition will be maintained Outcome: Progressing   Problem: Elimination: Goal: Will not experience complications related to urinary retention Outcome: Progressing   Problem: Pain Managment: Goal: General experience of comfort will improve Outcome: Progressing

## 2022-01-22 NOTE — Transfer of Care (Signed)
Immediate Anesthesia Transfer of Care Note  Patient: Alyssa [name]  Procedure(s) Performed: THYROID ISTHMUSECTOMY WITH RESECTION OF SUBSTERNAL MASS  Patient Location: PACU  Anesthesia Type:General  Level of Consciousness: awake, alert , oriented and patient cooperative  Airway & Oxygen Therapy: Patient Spontanous Breathing and Patient connected to face mask oxygen  Post-op Assessment: Report given to RN, Post -op Vital signs reviewed and stable and Patient moving all extremities  Post vital signs: Reviewed and stable  Last Vitals:  Vitals Value Taken Time  BP 142/79 01/22/22 0922  Temp    Pulse 85 01/22/22 0925  Resp 18 01/22/22 0925  SpO2 100 % 01/22/22 0925  Vitals shown include unvalidated device data.  Last Pain:  Vitals:   01/22/22 0551  TempSrc:   PainSc: 0-No pain         Complications: No notable events documented.

## 2022-01-22 NOTE — Op Note (Signed)
Operative Note  Pre-operative Diagnosis:  Substernal thyroid goiter, thyroid nodule  Post-operative Diagnosis:  same  Surgeon:  Armandina Gemma, MD  Assistant:  Carlena Hurl, PA-C   Procedure:  1. Resection substernal thyroid goiter  2. Thyroid isthmusectomy  Anesthesia:  general  Estimated Blood Loss:  25 cc  Drains: none         Specimen: substernal thyroid mass, thyroid isthmus - both to pathology  Indications:  Patient is referred by Dr. Hulan Saas for surgical evaluation and management of newly diagnosed thyroid nodules. Patient had undergone a CT scan of the chest after having had an abnormal preoperative chest x-ray. CT scan demonstrated a large exophytic mass extending from the thyroid isthmus into the anterior mediastinum measuring up to 6.5 cm in size. Subsequent ultrasound was performed on June 30, 2021. This showed a moderately heterogeneous thyroid gland with the right and left thyroid lobes being essentially normal. There was a 2.2 cm nodule in the mid thyroid isthmus. There was a exophytic mass measuring 5.8 cm below the isthmus in the midline extending into the anterior mediastinum. Fine-needle aspiration biopsy was recommended. Patient has never been on thyroid medication. She has had no prior head or neck surgery. She denies any compressive symptoms with the exception of recent mild hoarseness of voice and some air hunger upon lying recumbent at night. Patient now comes to surgery for resection of substernal mass and thyroid isthmus containing nodule.  Procedure:  The patient was seen in the pre-op holding area. The risks, benefits, complications, treatment options, and expected outcomes were previously discussed with the patient. The patient agreed with the proposed plan and has signed the informed consent form.  The patient was brought to the operating room by the surgical team, identified as Dillon Bjork and the procedure verified. A "time out" was completed and the  above information confirmed.  Following administration of general anesthesia, the patient was positioned and then prepped and draped in the usual aseptic fashion.  After ascertaining that an adequate level of anesthesia been achieved, a Kocher incision is made with a #15 blade.  Dissection is carried through subcutaneous tissues.  Hemostasis is achieved with the electrocautery.  Platysma is divided with the electrocautery.  Skin flaps are elevated cephalad and caudad from the thyroid notch to the sternal notch.  Horner self-retaining retractors placed for exposure.  Strap muscles are incised in the midline.  Dissection is begun on the left side.  Strap muscles are reflected laterally exposing the left thyroid lobe.  Dissection was carried inferiorly and across the midline.  Strap muscles on the right are mobilized off of the underlying thyroid lobe and reflected laterally.  Dissection is then carried into the anterior mediastinum.  Venous tributaries are divided between ligaclips with the harmonic scalpel.  With gentle blunt dissection the substernal mass is identified in the extent of the mass is defined.  Again with gentle blunt dissection the mass is mobilized from the surrounding structures.  Venous tributaries are divided between small and medium ligaclips with the harmonic scalpel.  After mobilizing the superior aspect of the mass completely, the mass is gently delivered out of the anterior mediastinum and off of the anterior surface of the trachea.  The mass is brought up and into the wound for better visualization.  Vascular structures are dissected out and divided between small and medium ligaclips using the harmonic scalpel.  The entire mass is excised and submitted to pathology for review.  There was an additional nodular  mass in the superior portion of the anterior mediastinum which was also dissected out and resected in its entirety and submitted with the specimen to pathology for review.   Mediastinum was then irrigated with warm saline which was evacuated.  Dry packs were placed.  Good hemostasis was noted.  There is a 2 cm nodule in the mid thyroid isthmus.  This is gently mobilized.  Vascular structures superiorly and inferiorly to the isthmus are divided with the harmonic scalpel between medium ligaclips.  The gland is mobilized off the underlying trachea.  The thyroid parenchyma is divided at the junction of the isthmus and left thyroid lobe with the harmonic scalpel.  The isthmus is then dissected completely off of the anterior trachea.  Thyroid parenchyma is divided at the junction of the isthmus and the right thyroid lobe with the harmonic scalpel.  Specimen is submitted to pathology for review labeled as thyroid isthmus.  Neck is irrigated with warm saline.  Good hemostasis is noted throughout the operative field.  Fibrillar is placed throughout the operative field.  Strap muscles are closed in 2 layers with interrupted 3-0 Vicryl sutures.  Subcutaneous tissues are inspected for hemostasis.  Platysma was closed with interrupted 3-0 Vicryl sutures.  Skin is closed with a running 4-0 Monocryl subcuticular suture.  Wound was washed and dried and Dermabond is applied as dressing.  Patient is awakened from anesthesia and transported to the recovery room.  The patient tolerated the procedure well.   Armandina Gemma, [facility] Office: [phone]

## 2022-01-22 NOTE — Interval H&P Note (Signed)
History and Physical Interval Note:  01/22/2022 6:57 AM  Alyssa [name]  has presented today for surgery, with the diagnosis of SUBSTERNAL THYROID MASS.  The various methods of treatment have been discussed with the patient and family. After consideration of risks, benefits and other options for treatment, the patient has consented to    Procedure(s): THYROID ISTHMUSECTOMY WITH RESECTION OF SUBSTERNAL MASS (N/A) as a surgical intervention.    The patient's history has been reviewed, patient examined, no change in status, stable for surgery.  I have reviewed the patient's chart and labs.  Questions were answered to the patient's satisfaction.    Armandina Gemma, [facility] [place] [facility] practice Office: [place]

## 2022-01-23 ENCOUNTER — Encounter (HOSPITAL_COMMUNITY): Payer: Self-pay | Admitting: Surgery

## 2022-01-23 DIAGNOSIS — I10 Essential (primary) hypertension: Secondary | ICD-10-CM | POA: Diagnosis not present

## 2022-01-23 DIAGNOSIS — E049 Nontoxic goiter, unspecified: Secondary | ICD-10-CM | POA: Diagnosis not present

## 2022-01-23 DIAGNOSIS — K219 Gastro-esophageal reflux disease without esophagitis: Secondary | ICD-10-CM | POA: Diagnosis not present

## 2022-01-23 DIAGNOSIS — E042 Nontoxic multinodular goiter: Secondary | ICD-10-CM | POA: Diagnosis not present

## 2022-01-23 LAB — BASIC METABOLIC PANEL
Anion gap: 6 (ref 5–15)
BUN: 14 mg/dL (ref 6–20)
CO2: 27 mmol/L (ref 22–32)
Calcium: 8.7 mg/dL — ABNORMAL LOW (ref 8.9–10.3)
Chloride: 107 mmol/L (ref 98–111)
Creatinine, Ser: 0.94 mg/dL (ref 0.44–1.00)
GFR, Estimated: >60 mL/min (ref >60)
Glucose, Bld: 127 mg/dL — ABNORMAL HIGH (ref 70–99)
Potassium: 4.1 mmol/L (ref 3.5–5.1)
Sodium: 140 mmol/L (ref 135–145)

## 2022-01-23 MED ORDER — OXYCODONE HCL 5 MG PO TABS: 5.0000 mg | ORAL_TABLET | Freq: Four times a day (QID) | ORAL | 0 refills | Status: DC | PRN

## 2022-01-23 NOTE — Progress Notes (Signed)
Patient was given discharge instructions, and all questions were answered.  Patient was stable for discharge and was taken to the main exit by wheelchair. 

## 2022-01-23 NOTE — Discharge Instructions (Signed)
[facility] - Dr. Mayleen Borrero  THYROID & PARATHYROID SURGERY:  POST-OP INSTRUCTIONS  Always review the instruction sheet provided by the hospital nurse at discharge.  A prescription for pain medication may be sent to your pharmacy at the time of discharge.  Take your pain medication as prescribed.  If narcotic pain medicine is not needed, then you may take acetaminophen (Tylenol) or ibuprofen (Advil) as needed for pain or soreness.  Take your normal home medications as prescribed unless otherwise directed.  If you need a refill on your pain medication, please contact the office during regular business hours.  Prescriptions will not be processed by the office after 5:00PM or on weekends.  Start with a light diet upon arrival home, such as soup and crackers or toast.  Be sure to drink plenty of fluids.  Resume your normal diet the day after surgery.  Most patients will experience some swelling and bruising on the chest and neck area.  Ice packs will help for the first 48 hours after arriving home.  Swelling and bruising will take several days to resolve.   It is common to experience some constipation after surgery.  Increasing fluid intake and taking a stool softener (Colace) will usually help to prevent this problem.  A mild laxative (Milk of Magnesia or Miralax) should be taken according to package directions if there has been no bowel movement after 48 hours.  Dermabond glue covers your incision. This seals the wound and you may shower at any time. The Dermabond will remain in place for about a week.  You may gradually remove the glue when it loosens around the edges.  If you need to loosen the Dermabond for removal, apply a layer of Vaseline to the wound for 15 minutes and then remove with a Kleenex. Your sutures are under the skin and will not show - they will dissolve on their own.  You may resume light daily activities beginning the day after discharge (such as self-care,  walking, climbing stairs), gradually increasing activities as tolerated. You may have sexual intercourse when it is comfortable. Refrain from any heavy lifting or straining until approved by your doctor. You may drive when you no longer are taking prescription pain medication, you can comfortably wear a seatbelt, and you can safely maneuver your car and apply the brakes.  You will see your doctor in the office for a follow-up appointment approximately three weeks after your surgery.  Make sure that you call for this appointment within a day or two after you arrive home to insure a convenient appointment time. Please have any requested laboratory tests performed a few days prior to your office visit so that the results will be available at your follow up appointment.  WHEN TO CALL THE [facility] OFFICE: -- Fever greater than 101.5 -- Inability to urinate -- Nausea and/or vomiting - persistent -- Extreme swelling or bruising -- Continued bleeding from incision -- Increased pain, redness, or drainage from the incision -- Difficulty swallowing or breathing -- Muscle cramping or spasms -- Numbness or tingling in hands or around lips  The clinic staff is available to answer your questions during regular business hours.  Please don't hesitate to call and ask to speak to one of the nurses if you have concerns.  [facility] OFFICE: [phone] (24 hours)  Please sign up for MyChart accounts. This will allow you to communicate directly with my nurse or myself without having to call the office. It will also allow you   to view your test results. You will need to enroll in MyChart for my office ([facility]) and for the hospital ([facility]).  Kella Splinter, MD [facility] 

## 2022-01-23 NOTE — Discharge Summary (Signed)
Physician Discharge Summary   Patient ID: Alyssa [name] MRN: [id number] DOB/AGE: 1960-10-16 61 y.o.  Admit date: 01/22/2022  Discharge date: 01/23/2022  Discharge Diagnoses:  Principal Problem:   Substernal thyroid goiter Active Problems:   Thyroid nodule   Discharged Condition: good  Hospital Course: Patient was admitted for observation following thyroid surgery.  Post op course was uncomplicated.  Pain was well controlled.  Tolerated diet.  Post op calcium level on morning following surgery was 8.7 mg/dl.  Patient was prepared for discharge home on POD#1.  Consults: None  Treatments: surgery: resection substernal thyroid goiter and isthmusectomy  Discharge Exam: Blood pressure 125/78, pulse (!) 105, temperature 98.8 F (37.1 C), temperature source Oral, resp. rate 16, height '5\' 1"'$  (1.549 m), weight 87.5 kg, SpO2 93 %. HEENT - clear Neck - wound dry and intact, minimal STS, voice normal, Dermabond in place  Disposition: Home  Discharge Instructions     Diet - low sodium heart healthy   Complete by: As directed    Increase activity slowly   Complete by: As directed    No dressing needed   Complete by: As directed       Allergies as of 01/23/2022       Reactions   Codeine Itching   Doxycycline Other (See Comments)   chest pain   Penicillins Nausea And Vomiting   Prednisone Anxiety   severe anxiety        Medication List     TAKE these medications    amLODipine 5 MG tablet Commonly known as: NORVASC TAKE 1 TABLET BY MOUTH  DAILY What changed: when to take this   diphenhydrAMINE 25 MG tablet Commonly known as: BENADRYL Take 50 mg by mouth at bedtime as needed for sleep.   DULoxetine 30 MG capsule Commonly known as: CYMBALTA TAKE 1 CAPSULE BY MOUTH  DAILY What changed: when to take this   gabapentin 100 MG capsule Commonly known as: NEURONTIN Take 2 capsules (200 mg total) by mouth at bedtime. What changed:  how much to take when to take  this reasons to take this   gabapentin 300 MG capsule Commonly known as: NEURONTIN Take 1 capsule by mouth at bedtime What changed: Another medication with the same name was changed. Make sure you understand how and when to take each.   losartan 50 MG tablet Commonly known as: COZAAR TAKE 1 TABLET BY MOUTH  DAILY What changed: when to take this   meloxicam 15 MG tablet Commonly known as: MOBIC Take 1 tablet (15 mg total) by mouth daily as needed for pain. What changed: when to take this   omeprazole 20 MG capsule Commonly known as: PRILOSEC TAKE 1 CAPSULE BY MOUTH  DAILY What changed: when to take this   oxyCODONE 5 MG immediate release tablet Commonly known as: Oxy IR/ROXICODONE Take 1 tablet (5 mg total) by mouth every 6 (six) hours as needed for moderate pain.   tiZANidine 4 MG tablet Commonly known as: Zanaflex Take 1 tablet (4 mg total) by mouth at bedtime.               Discharge Care Instructions  (From admission, onward)           Start     Ordered   01/23/22 0000  No dressing needed        01/23/22 0701            Follow-up Information     Armandina Gemma, MD. Schedule  an appointment as soon as possible for a visit in 3 week(s).   Specialty: General Surgery Why: For wound re-check Contact information: [place] [id number] [phone]                 Tywon Niday, [place] Office: [phone]   Signed: Armandina Gemma 01/23/2022, 7:01 AM

## 2022-01-24 LAB — SURGICAL PATHOLOGY

## 2022-01-24 NOTE — Anesthesia Postprocedure Evaluation (Signed)
Anesthesia Post Note  Patient: Alyssa [name]  Procedure(s) Performed: THYROID ISTHMUSECTOMY WITH RESECTION OF SUBSTERNAL MASS     Patient location during evaluation: PACU Anesthesia Type: General Level of consciousness: awake and alert Pain management: pain level controlled Vital Signs Assessment: post-procedure vital signs reviewed and stable Respiratory status: spontaneous breathing, nonlabored ventilation, respiratory function stable and patient connected to nasal cannula oxygen Cardiovascular status: blood pressure returned to baseline and stable Postop Assessment: no apparent nausea or vomiting Anesthetic complications: no   No notable events documented.  Last Vitals:  Vitals:   01/23/22 0519 01/23/22 0529  BP: 125/78   Pulse: (!) 105   Resp: 16   Temp: 37.8 C 37.1 C  SpO2: (!) 89% 93%    Last Pain:  Vitals:   01/23/22 0755  TempSrc:   PainSc: 6                  Omarii Scalzo

## 2022-01-24 NOTE — Progress Notes (Signed)
Good news!  Pathology is benign as expected.  Smith, MD [facility] [place] practice Office: [phone]

## 2022-02-04 ENCOUNTER — Other Ambulatory Visit: Payer: Self-pay | Admitting: Internal Medicine

## 2022-02-19 ENCOUNTER — Ambulatory Visit: Payer: BC Managed Care – PPO | Admitting: Family Medicine

## 2022-02-19 NOTE — Progress Notes (Deleted)
   I, [name], LAT, ATC acting as a scribe for [name], MD.  Alyssa [name] is a 61 y.o. female who presents to [place] at [facility] today for L knee pain. Pt was previously seen by Dr. Tamala Julian on 10/03/21  Pt was seen by Dr. Glennon Mac on 04/28/21 for L tibial insufficiency fx and had a f/u visit w/ Dr. Tamala Julian on 10/03/21. Pt had a L TKR on 06/20/21. Today, pt reports  Dx imaging: 04/14/21 L knee XR  10/03/20 L knee MRI  08/23/20 L knee XR  Pertinent review of systems: ***  Relevant historical information: ***   Exam:  There were no vitals taken for this visit. General: Well Developed, well nourished, and in no acute distress.   MSK: ***    Lab and Radiology Results No results found for this or any previous visit (from the past 72 hour(s)). No results found.     Assessment and Plan: 61 y.o. female with ***   PDMP not reviewed this encounter. No orders of the defined types were placed in this encounter.  No orders of the defined types were placed in this encounter.    Discussed warning signs or symptoms. Please see discharge instructions. Patient expresses understanding.   ***

## 2022-02-20 ENCOUNTER — Other Ambulatory Visit: Payer: Self-pay | Admitting: Internal Medicine

## 2022-03-02 ENCOUNTER — Other Ambulatory Visit: Payer: Self-pay | Admitting: Internal Medicine

## 2022-03-02 DIAGNOSIS — E890 Postprocedural hypothyroidism: Secondary | ICD-10-CM | POA: Diagnosis not present

## 2022-03-02 NOTE — Progress Notes (Unsigned)
Alyssa Alyssa [name], am serving as a scribe for Dr. [name].  Alyssa Alyssa [name] is a 61 y.o. female who presents to [place] at [facility] today for L knee pain. Pt was previously seen by Dr. Tamala Julian on 10/03/21  Pt was seen by Dr. Glennon Mac on 04/28/21 for L tibial insufficiency fx and had a f/u visit w/ Dr. Tamala Julian on 10/03/21. Pt had a L TKR on 06/20/21. Today, pt reports that since she went back to work after thyroid surgery, her back has been spasming in lower and thoracic spine. Using zanaflex more often than not. Does have tinging in L foot and pain in L leg that wraps around lateral aspect into anterior tibia. Gabapentin '100mg'$  during the day is helpful but she is out of the '100mg'$  capsules. Uses '300mg'$  at night.   Also c/o R hand pain over MCP joints. Wonders if her thyroid labs are affecting multiple joints.   She recently had a thyroid nodule removal in late July.  Prior to that her TSH was very low.  She recently had TSH checked again September 1 and her TSH was 11.  She denies any history of rheumatoid arthritis.  She does have a endocrinologist but cannot remember their name currently.  Dx imaging: 04/14/21 L knee XR  10/03/20 L knee MRI  08/23/20 L knee XR  Pertinent review of systems: Alyssa Alyssa [name]  Relevant historical information: History of carcinoma of the left ovary 2016.   Exam:  BP 122/84   Pulse 72   Ht '5\' 1"'$  (1.549 m)   Wt 196 lb (88.9 kg)   SpO2 97%   BMI 37.03 kg/m  General: Well Developed, well nourished, and in Alyssa [name] acute distress.   MSK: T-spine and L-spine normal appearing Nontender to midline. Decreased thoracic and lumbar motion. Normal gait.  Hands bilaterally swelling across MCPs PIPs and DIPs with some ulnar deviation of MCPs bilaterally.    Lab and Radiology Results  Bilateral hand x-ray images obtained today personally and independently interpreted.  Right hand: DJD changes across hand most dominant at third MCP and third DIP.   Some possible bony erosions are present across MCPs although I am not certain.  Left hand: Degenerative changes across hand again dominant at third MCP and DIP with some bony erosions present.  Old avulsion at ulnar third MCP  Alyssa [name] acute fractures are present in either hand x-ray.  Await formal radiology review   TSH from 03/02/22 TSH - [facility] 0.450 - 4.500 uIU/mL 11.000 High      Lab Results  Component Value Date   TSH 0.04 (L) 08/29/2021     Assessment and Plan: 61 y.o. female with acute exacerbation of chronic low back pain and thoracic back pain.  This is most likely to be muscle spasm and dysfunction and should be treated with physical therapy and heating pad.  Additionally the tizanidine that she already has should be helpful.  I have refilled the gabapentin which historically has been helpful for this as well.  However I am concerned there may be a more systemic issue.  She has an elevated TSH and could have a more generalized myopathy or myositis.  We will do some limited rheumatologic evaluation listed below including checking sed rate and CK.  She will see her primary care provider later today and should likely follow-up with her endocrinologist.  Additionally she has bilateral hand pain and swelling and concern for synovitis.  I am concerned she may  have a rheumatologic process specifically rheumatoid arthritis.  We will again we will proceed with a generalized rheumatologic evaluation listed below.  If this is concerning will refer to rheumatology.  Recheck in 6 weeks or sooner if needed.   PDMP not reviewed this encounter. Orders Placed This Encounter  Procedures   DG Hand Complete Left    Standing Status:   Future    Number of Occurrences:   1    Standing Expiration Date:   03/07/2023    Order Specific Question:   Reason for Exam (SYMPTOM  OR DIAGNOSIS REQUIRED)    Answer:   L hand swelling    Order Specific Question:   Is patient pregnant?    Answer:   Alyssa [name]    Order  Specific Question:   Preferred imaging location?    Answer:   [facility]   DG Hand Complete Right    Standing Status:   Future    Number of Occurrences:   1    Standing Expiration Date:   03/07/2023    Order Specific Question:   Reason for Exam (SYMPTOM  OR DIAGNOSIS REQUIRED)    Answer:   R hand swelling    Order Specific Question:   Is patient pregnant?    Answer:   Alyssa [name]    Order Specific Question:   Preferred imaging location?    Answer:   [facility]   Sedimentation rate    Standing Status:   Future    Standing Expiration Date:   03/07/2023   Rheumatoid factor    Standing Status:   Future    Standing Expiration Date:   03/07/2023   ANA    Standing Status:   Future    Standing Expiration Date:   09/07/1827   Cyclic citrul peptide antibody, IgG    Standing Status:   Future    Standing Expiration Date:   03/07/2023   CK    Standing Status:   Future    Standing Expiration Date:   03/07/2023   Ambulatory referral to Physical Therapy    Referral Priority:   Routine    Referral Type:   Physical Medicine    Referral Reason:   Specialty Services Required    Requested Specialty:   Physical Therapy    Number of Visits Requested:   1   Meds ordered this encounter  Medications   gabapentin (NEURONTIN) 300 MG capsule    Sig: Take 1 capsule (300 mg total) by mouth at bedtime.    Dispense:  90 capsule    Refill:  2   gabapentin (NEURONTIN) 100 MG capsule    Sig: Take 1 capsule (100 mg total) by mouth 2 (two) times daily as needed (nerve pain).    Dispense:  90 capsule    Refill:  3     Discussed warning signs or symptoms. Please see discharge instructions. Patient expresses understanding.   The above documentation has been reviewed and is accurate and complete [name], M.D.

## 2022-03-05 ENCOUNTER — Encounter: Payer: Self-pay | Admitting: Internal Medicine

## 2022-03-05 NOTE — Patient Instructions (Addendum)
Blood work was ordered.     Medications changes include :   levothyroxine 25 mcg daily - 30 -60 minutes prior to breakfast   Your prescription(s) have been sent to your pharmacy.    A neck US was ordered.    Return in about 6 months (around 09/04/2022) for follow up.   Health Maintenance, Female Adopting a healthy lifestyle and getting preventive care are important in promoting health and wellness. Ask your health care provider about: The right schedule for you to have regular tests and exams. Things you can do on your own to prevent diseases and keep yourself healthy. What should I know about diet, weight, and exercise? Eat a healthy diet  Eat a diet that includes plenty of vegetables, fruits, low-fat dairy products, and lean protein. Do not eat a lot of foods that are high in solid fats, added sugars, or sodium. Maintain a healthy weight Body mass index (BMI) is used to identify weight problems. It estimates body fat based on height and weight. Your health care provider can help determine your BMI and help you achieve or maintain a healthy weight. Get regular exercise Get regular exercise. This is one of the most important things you can do for your health. Most adults should: Exercise for at least 150 minutes each week. The exercise should increase your heart rate and make you sweat (moderate-intensity exercise). Do strengthening exercises at least twice a week. This is in addition to the moderate-intensity exercise. Spend less time sitting. Even light physical activity can be beneficial. Watch cholesterol and blood lipids Have your blood tested for lipids and cholesterol at 59 years of age, then have this test every 5 years. Have your cholesterol levels checked more often if: Your lipid or cholesterol levels are high. You are older than 59 years of age. You are at high risk for heart disease. What should I know about cancer screening? Depending on your health history  and family history, you may need to have cancer screening at various ages. This may include screening for: Breast cancer. Cervical cancer. Colorectal cancer. Skin cancer. Lung cancer. What should I know about heart disease, diabetes, and high blood pressure? Blood pressure and heart disease High blood pressure causes heart disease and increases the risk of stroke. This is more likely to develop in people who have high blood pressure readings or are overweight. Have your blood pressure checked: Every 3-5 years if you are 73-45 years of age. Every year if you are 1 years old or older. Diabetes Have regular diabetes screenings. This checks your fasting blood sugar level. Have the screening done: Once every three years after age 3 if you are at a normal weight and have a low risk for diabetes. More often and at a younger age if you are overweight or have a high risk for diabetes. What should I know about preventing infection? Hepatitis B If you have a higher risk for hepatitis B, you should be screened for this virus. Talk with your health care provider to find out if you are at risk for hepatitis B infection. Hepatitis C Testing is recommended for: Everyone born from [birth year] through 1963. Anyone with known risk factors for hepatitis C. Sexually transmitted infections (STIs) Get screened for STIs, including gonorrhea and chlamydia, if: You are sexually active and are younger than 59 years of age. You are older than 59 years of age and your health care provider tells you that you are at risk for  this type of infection. Your sexual activity has changed since you were last screened, and you are at increased risk for chlamydia or gonorrhea. Ask your health care provider if you are at risk. Ask your health care provider about whether you are at high risk for HIV. Your health care provider may recommend a prescription medicine to help prevent HIV infection. If you choose to take medicine to prevent  HIV, you should first get tested for HIV. You should then be tested every 3 months for as long as you are taking the medicine. Pregnancy If you are about to stop having your period (premenopausal) and you may become pregnant, seek counseling before you get pregnant. Take 400 to 800 micrograms (mcg) of folic acid every day if you become pregnant. Ask for birth control (contraception) if you want to prevent pregnancy. Osteoporosis and menopause Osteoporosis is a disease in which the bones lose minerals and strength with aging. This can result in bone fractures. If you are 68 years old or older, or if you are at risk for osteoporosis and fractures, ask your health care provider if you should: Be screened for bone loss. Take a calcium or vitamin D supplement to lower your risk of fractures. Be given hormone replacement therapy (HRT) to treat symptoms of menopause. Follow these instructions at home: Alcohol use Do not drink alcohol if: Your health care provider tells you not to drink. You are pregnant, may be pregnant, or are planning to become pregnant. If you drink alcohol: Limit how much you have to: 0-1 drink a day. Know how much alcohol is in your drink. In the [place] one drink equals one 12 oz bottle of beer (355 mL), one 5 oz glass of wine (148 mL), or one 1 oz glass of hard liquor (44 mL). Lifestyle Do not use any products that contain nicotine or tobacco. These products include cigarettes, chewing tobacco, and vaping devices, such as e-cigarettes. If you need help quitting, ask your health care provider. Do not use street drugs. Do not share needles. Ask your health care provider for help if you need support or information about quitting drugs. General instructions Schedule regular health, dental, and eye exams. Stay current with your vaccines. Tell your health care provider if: You often feel depressed. You have ever been abused or do not feel safe at home. Summary Adopting a  healthy lifestyle and getting preventive care are important in promoting health and wellness. Follow your health care provider's instructions about healthy diet, exercising, and getting tested or screened for diseases. Follow your health care provider's instructions on monitoring your cholesterol and blood pressure. This information is not intended to replace advice given to you by your health care provider. Make sure you discuss any questions you have with your health care provider. Document Revised: 11/07/2020 Document Reviewed: 11/07/2020 Elsevier Patient Education  [employer].

## 2022-03-05 NOTE — Progress Notes (Signed)
Subjective:    Patient ID: Alyssa [name], female    DOB: [birth year], 61 y.o.   MRN: [id number]      HPI Alyssa [name] is here for a Physical exam.   She had a partial thyroidectomy for goiter - substernal in July - about 6 weeks ago.  She is doing well.  Initially the surgical site looks great just after surgery, but she has developed like a swelling there.  Is unsure why.  She denies pain.  She follows up with surgery next week.  She has felt more tired.   Medications and allergies reviewed with patient and updated if appropriate.  Current Outpatient Medications on File Prior to Visit  Medication Sig Dispense Refill   amLODipine (NORVASC) 5 MG tablet TAKE 1 TABLET BY MOUTH  DAILY (Patient taking differently: Take 5 mg by mouth every morning.) 90 tablet 3   diphenhydrAMINE (BENADRYL) 25 MG tablet Take 50 mg by mouth at bedtime as needed for sleep.     DULoxetine (CYMBALTA) 30 MG capsule TAKE 1 CAPSULE BY MOUTH  DAILY (Patient taking differently: Take 30 mg by mouth at bedtime.) 90 capsule 3   losartan (COZAAR) 50 MG tablet TAKE 1 TABLET BY MOUTH  DAILY (Patient taking differently: Take 50 mg by mouth every morning.) 90 tablet 3   meloxicam (MOBIC) 15 MG tablet TAKE 1 TABLET BY MOUTH ONCE DAILY AS NEEDED FOR PAIN 30 tablet 0   omeprazole (PRILOSEC) 20 MG capsule TAKE 1 CAPSULE BY MOUTH  DAILY (Patient taking differently: Take 20 mg by mouth every morning.) 90 capsule 3   tiZANidine (ZANAFLEX) 4 MG tablet TAKE 1 TABLET BY MOUTH EVERY 6 HOURS AS NEEDED FOR MUSCLE SPASM 40 tablet 0   No current facility-administered medications on file prior to visit.    Review of Systems  Constitutional:  Positive for chills and fatigue. Negative for fever.  Eyes:  Negative for visual disturbance.  Respiratory:  Negative for cough, shortness of breath and wheezing.   Cardiovascular:  Positive for leg swelling (LLE - chronic - not new). Negative for chest pain and palpitations.  Gastrointestinal:   Positive for nausea (mild). Negative for abdominal pain, constipation and diarrhea.       No gerd  Endocrine: Positive for cold intolerance.  Genitourinary:  Negative for dysuria.  Musculoskeletal:  Positive for arthralgias (hips, hands) and back pain.  Skin:  Negative for rash.  Neurological:  Positive for headaches (more than usual). Negative for light-headedness.  Psychiatric/Behavioral:  Negative for dysphoric mood. The patient is not nervous/anxious.        Objective:   Vitals:   03/06/22 0902  BP: 120/82  Pulse: 72  Temp: 98.5 F (36.9 C)  SpO2: 97%   Filed Weights   03/06/22 0902  Weight: 195 lb (88.5 kg)   Body mass index is 36.84 kg/m.  BP Readings from Last 3 Encounters:  03/06/22 120/82  03/06/22 122/84  01/23/22 125/78    Wt Readings from Last 3 Encounters:  03/06/22 195 lb (88.5 kg)  03/06/22 196 lb (88.9 kg)  01/22/22 193 lb (87.5 kg)       Physical Exam Constitutional: She appears well-developed and well-nourished. No distress.  HENT:  Head: Normocephalic and atraumatic.  Right Ear: External ear normal. Normal ear canal and TM Left Ear: External ear normal.  Normal ear canal and TM Mouth/Throat: Oropharynx is clear and moist.  Eyes: Conjunctivae normal.  Neck: Neck supple. No tracheal deviation present. No Thyromegaly present.  Soft tissue lump central lower neck just above aortic notch - nontender, no erythema No carotid bruit  Cardiovascular: Normal rate, regular rhythm and normal heart sounds.   No murmur heard.  No edema. Pulmonary/Chest: Effort normal and breath sounds normal. No respiratory distress. She has no wheezes. She has no rales.  Breast: deferred   Abdominal: Soft. She exhibits no distension. There is no tenderness.  Lymphadenopathy: She has no cervical adenopathy.  Skin: Skin is warm and dry. She is not diaphoretic.  Psychiatric: She has a normal mood and affect. Her behavior is normal.     Lab Results  Component Value  Date   WBC 3.3 (L) 01/12/2022   HGB 15.1 (H) 01/12/2022   HCT 45.3 01/12/2022   PLT 250 01/12/2022   GLUCOSE 127 (H) 01/23/2022   CHOL 166 02/28/2021   TRIG 77.0 02/28/2021   HDL 57.50 02/28/2021   LDLCALC 93 02/28/2021   ALT 22 02/28/2021   AST 23 02/28/2021   NA 140 01/23/2022   K 4.1 01/23/2022   CL 107 01/23/2022   CREATININE 0.94 01/23/2022   BUN 14 01/23/2022   CO2 27 01/23/2022   TSH 0.04 (L) 08/29/2021   INR 1.0 06/08/2021   HGBA1C 5.5 01/12/2022         Assessment & Plan:   Physical exam: Screening blood work  ordered Exercise  none- stressed regular exercise Weight  obese - advised weight loss Substance abuse  none   Reviewed recommended immunizations.   Health Maintenance  Topic Date Due   COVID-19 Vaccine (1) Never done   Zoster Vaccines- Shingrix (1 of 2) Never done   TETANUS/TDAP  04/09/2018   MAMMOGRAM  12/24/2021   INFLUENZA VACCINE  09/30/2022 (Originally 01/30/2022)   COLONOSCOPY (Pts 45-63yr Insurance coverage will need to be confirmed)  06/08/2027   Hepatitis C Screening  Completed   HIV Screening  Completed   HPV VACCINES  Aged Out          See Problem List for Assessment and Plan of chronic medical problems.

## 2022-03-06 ENCOUNTER — Ambulatory Visit (INDEPENDENT_AMBULATORY_CARE_PROVIDER_SITE_OTHER): Payer: BC Managed Care – PPO

## 2022-03-06 ENCOUNTER — Ambulatory Visit (INDEPENDENT_AMBULATORY_CARE_PROVIDER_SITE_OTHER): Payer: BC Managed Care – PPO | Admitting: Family Medicine

## 2022-03-06 ENCOUNTER — Ambulatory Visit (INDEPENDENT_AMBULATORY_CARE_PROVIDER_SITE_OTHER): Payer: BC Managed Care – PPO | Admitting: Internal Medicine

## 2022-03-06 ENCOUNTER — Encounter: Payer: Self-pay | Admitting: Family Medicine

## 2022-03-06 ENCOUNTER — Other Ambulatory Visit: Payer: BC Managed Care – PPO

## 2022-03-06 VITALS — BP 120/82 | HR 72 | Temp 98.5°F | Ht 61.0 in | Wt 195.0 lb

## 2022-03-06 VITALS — BP 122/84 | HR 72 | Ht 61.0 in | Wt 196.0 lb

## 2022-03-06 DIAGNOSIS — E041 Nontoxic single thyroid nodule: Secondary | ICD-10-CM | POA: Diagnosis not present

## 2022-03-06 DIAGNOSIS — E559 Vitamin D deficiency, unspecified: Secondary | ICD-10-CM

## 2022-03-06 DIAGNOSIS — M25542 Pain in joints of left hand: Secondary | ICD-10-CM

## 2022-03-06 DIAGNOSIS — R918 Other nonspecific abnormal finding of lung field: Secondary | ICD-10-CM

## 2022-03-06 DIAGNOSIS — F32A Depression, unspecified: Secondary | ICD-10-CM

## 2022-03-06 DIAGNOSIS — M791 Myalgia, unspecified site: Secondary | ICD-10-CM

## 2022-03-06 DIAGNOSIS — E890 Postprocedural hypothyroidism: Secondary | ICD-10-CM

## 2022-03-06 DIAGNOSIS — M25552 Pain in left hip: Secondary | ICD-10-CM

## 2022-03-06 DIAGNOSIS — M25551 Pain in right hip: Secondary | ICD-10-CM

## 2022-03-06 DIAGNOSIS — Z0000 Encounter for general adult medical examination without abnormal findings: Secondary | ICD-10-CM

## 2022-03-06 DIAGNOSIS — K760 Fatty (change of) liver, not elsewhere classified: Secondary | ICD-10-CM

## 2022-03-06 DIAGNOSIS — Z0001 Encounter for general adult medical examination with abnormal findings: Secondary | ICD-10-CM

## 2022-03-06 DIAGNOSIS — M25541 Pain in joints of right hand: Secondary | ICD-10-CM

## 2022-03-06 DIAGNOSIS — M79642 Pain in left hand: Secondary | ICD-10-CM | POA: Diagnosis not present

## 2022-03-06 DIAGNOSIS — M7989 Other specified soft tissue disorders: Secondary | ICD-10-CM | POA: Diagnosis not present

## 2022-03-06 DIAGNOSIS — M545 Low back pain, unspecified: Secondary | ICD-10-CM

## 2022-03-06 DIAGNOSIS — R229 Localized swelling, mass and lump, unspecified: Secondary | ICD-10-CM | POA: Diagnosis not present

## 2022-03-06 DIAGNOSIS — E049 Nontoxic goiter, unspecified: Secondary | ICD-10-CM

## 2022-03-06 DIAGNOSIS — G8929 Other chronic pain: Secondary | ICD-10-CM

## 2022-03-06 DIAGNOSIS — F419 Anxiety disorder, unspecified: Secondary | ICD-10-CM

## 2022-03-06 DIAGNOSIS — R7303 Prediabetes: Secondary | ICD-10-CM | POA: Diagnosis not present

## 2022-03-06 DIAGNOSIS — R7989 Other specified abnormal findings of blood chemistry: Secondary | ICD-10-CM | POA: Diagnosis not present

## 2022-03-06 DIAGNOSIS — K219 Gastro-esophageal reflux disease without esophagitis: Secondary | ICD-10-CM

## 2022-03-06 DIAGNOSIS — I10 Essential (primary) hypertension: Secondary | ICD-10-CM

## 2022-03-06 DIAGNOSIS — M79641 Pain in right hand: Secondary | ICD-10-CM | POA: Diagnosis not present

## 2022-03-06 LAB — COMPREHENSIVE METABOLIC PANEL
ALT: 21 U/L (ref 0–35)
AST: 26 U/L (ref 0–37)
Albumin: 4.4 g/dL (ref 3.5–5.2)
Alkaline Phosphatase: 108 U/L (ref 39–117)
BUN: 19 mg/dL (ref 6–23)
CO2: 29 mEq/L (ref 19–32)
Calcium: 9.9 mg/dL (ref 8.4–10.5)
Chloride: 102 mEq/L (ref 96–112)
Creatinine, Ser: 1.04 mg/dL (ref 0.40–1.20)
GFR: 58.85 mL/min — ABNORMAL LOW (ref >60.00)
Glucose, Bld: 101 mg/dL — ABNORMAL HIGH (ref 70–99)
Potassium: 4.1 mEq/L (ref 3.5–5.1)
Sodium: 141 mEq/L (ref 135–145)
Total Bilirubin: 0.5 mg/dL (ref 0.2–1.2)
Total Protein: 7.9 g/dL (ref 6.0–8.3)

## 2022-03-06 LAB — CBC WITH DIFFERENTIAL/PLATELET
Basophils Absolute: 0 10*3/uL (ref 0.0–0.1)
Basophils Relative: 1.2 % (ref 0.0–3.0)
Eosinophils Absolute: 0.1 10*3/uL (ref 0.0–0.7)
Eosinophils Relative: 3.7 % (ref 0.0–5.0)
HCT: 42.6 % (ref 36.0–46.0)
Hemoglobin: 14.4 g/dL (ref 12.0–15.0)
Lymphocytes Relative: 22.9 % (ref 12.0–46.0)
Lymphs Abs: 0.8 10*3/uL (ref 0.7–4.0)
MCHC: 33.7 g/dL (ref 30.0–36.0)
MCV: 89.5 fl (ref 78.0–100.0)
Monocytes Absolute: 0.2 10*3/uL (ref 0.1–1.0)
Monocytes Relative: 6.1 % (ref 3.0–12.0)
Neutro Abs: 2.4 10*3/uL (ref 1.4–7.7)
Neutrophils Relative %: 66.1 % (ref 43.0–77.0)
Platelets: 243 10*3/uL (ref 150.0–400.0)
RBC: 4.76 Mil/uL (ref 3.87–5.11)
RDW: 13.9 % (ref 11.5–15.5)
WBC: 3.6 10*3/uL — ABNORMAL LOW (ref 4.0–10.5)

## 2022-03-06 LAB — SEDIMENTATION RATE: Sed Rate: 22 mm/hr (ref 0–30)

## 2022-03-06 LAB — T3, FREE: T3, Free: 3 pg/mL (ref 2.3–4.2)

## 2022-03-06 LAB — LIPID PANEL
Cholesterol: 203 mg/dL — ABNORMAL HIGH (ref 0–200)
HDL: 70.6 mg/dL (ref >39.00)
LDL Cholesterol: 120 mg/dL — ABNORMAL HIGH (ref 0–99)
NonHDL: 132.68
Total CHOL/HDL Ratio: 3
Triglycerides: 64 mg/dL (ref 0.0–149.0)
VLDL: 12.8 mg/dL (ref 0.0–40.0)

## 2022-03-06 LAB — CK: Total CK: 306 U/L — ABNORMAL HIGH (ref 7–177)

## 2022-03-06 LAB — HEMOGLOBIN A1C: Hgb A1c MFr Bld: 6.2 % (ref 4.6–6.5)

## 2022-03-06 LAB — VITAMIN D 25 HYDROXY (VIT D DEFICIENCY, FRACTURES): VITD: 25.8 ng/mL — ABNORMAL LOW (ref 30.00–100.00)

## 2022-03-06 LAB — TSH: TSH: 13.41 u[IU]/mL — ABNORMAL HIGH (ref 0.35–5.50)

## 2022-03-06 LAB — T4, FREE: Free T4: 0.59 ng/dL — ABNORMAL LOW (ref 0.60–1.60)

## 2022-03-06 MED ORDER — GABAPENTIN 300 MG PO CAPS: 300.0000 mg | ORAL_CAPSULE | Freq: Every day | ORAL | 2 refills | Status: DC

## 2022-03-06 MED ORDER — LEVOTHYROXINE SODIUM 25 MCG PO TABS: 25.0000 ug | ORAL_TABLET | Freq: Every day | ORAL | 3 refills | Status: DC

## 2022-03-06 MED ORDER — GABAPENTIN 100 MG PO CAPS: 100.0000 mg | ORAL_CAPSULE | Freq: Two times a day (BID) | ORAL | 3 refills | Status: DC | PRN

## 2022-03-06 NOTE — Assessment & Plan Note (Signed)
New S/p partial thyroidectomy for substernal goiter Recent TSH done by surgery showed an elevation of the TSH to 11 Check TSH, free T4, free T3 today Start levothyroxine 25 mcg daily Repeat TSH in 6 weeks and will adjust medication as needed

## 2022-03-06 NOTE — Assessment & Plan Note (Signed)
Chronic First seen December 2022 Does have a history of ovarian cancer-no longer following with oncology Follow-up CT done 10/2021-nodules are stable Will repeat CT in December 2023

## 2022-03-06 NOTE — Progress Notes (Signed)
Bilateral hand x-rays indicate predominantly osteoarthritis.

## 2022-03-06 NOTE — Assessment & Plan Note (Signed)
Chronic Check a1c Low sugar / carb diet Stressed regular exercise  

## 2022-03-06 NOTE — Assessment & Plan Note (Signed)
Chronic Check vitamin d level Not currently taking any Start 2000 units of vitamin d daily

## 2022-03-06 NOTE — Assessment & Plan Note (Signed)
Chronic Controlled, Stable Continue Cymbalta 30 mg nightly

## 2022-03-06 NOTE — Assessment & Plan Note (Signed)
Chronic GERD controlled Continue omeprazole 20 mg daily  

## 2022-03-06 NOTE — Assessment & Plan Note (Addendum)
Acute Had surgical site where she had her partial thyroidectomy 6 weeks ago No pain ?  Seroma We will obtain an ultrasound to evaluate further She does see surgery next week and he can evaluate and treat as necessary

## 2022-03-06 NOTE — Assessment & Plan Note (Signed)
Chronic Blood pressure well controlled CMP Continue amlodipine 5 mg daily, losartan 50 mg daily 

## 2022-03-06 NOTE — Patient Instructions (Addendum)
Labs today after you see Dr. Myrla Halsted PT [place] today See me in 6 weeks

## 2022-03-06 NOTE — Assessment & Plan Note (Signed)
Chronic Seen on CT scan Discussed diagnosis and risk for liver cirrhosis Encouraged healthy diet, weight loss

## 2022-03-08 LAB — ANA: Anti Nuclear Antibody (ANA): NEGATIVE

## 2022-03-08 LAB — RHEUMATOID FACTOR: Rheumatoid fact SerPl-aCnc: <14 IU/mL (ref <14)

## 2022-03-08 LAB — CYCLIC CITRUL PEPTIDE ANTIBODY, IGG: Cyclic Citrullin Peptide Ab: <16 UNITS

## 2022-03-09 ENCOUNTER — Ambulatory Visit
Admission: RE | Admit: 2022-03-09 | Discharge: 2022-03-09 | Disposition: A | Discharge status: Home / Self Care | Payer: BC Managed Care – PPO | Source: Ambulatory Visit | Attending: Internal Medicine | Admitting: Internal Medicine

## 2022-03-09 DIAGNOSIS — E890 Postprocedural hypothyroidism: Secondary | ICD-10-CM | POA: Diagnosis not present

## 2022-03-09 DIAGNOSIS — E049 Nontoxic goiter, unspecified: Secondary | ICD-10-CM

## 2022-03-09 DIAGNOSIS — R229 Localized swelling, mass and lump, unspecified: Secondary | ICD-10-CM

## 2022-03-12 NOTE — Progress Notes (Signed)
Your labs are positive for elevated CK which is a sign of muscle inflammation.  Low thyroid hormone can cause this which is most likely the explanation.  Dr. Quay Burow started you on thyroid medication which should be helpful.  We will reassess this when you come back and see me in October.  Other labs are negative.

## 2022-03-16 ENCOUNTER — Encounter: Payer: Self-pay | Admitting: Internal Medicine

## 2022-03-16 IMAGING — CT CT CHEST W/ CM
1 series · 15 of 33 positions shown, 19 images · IV contrast (iopamidol)
Comparison: Chest radiographs, 06/08/2021

CLINICAL DATA: Right paratracheal soft tissue mass incidentally
identified by prior radiographs

EXAM:
CT CHEST WITH CONTRAST
TECHNIQUE: Multidetector CT imaging of the chest was performed during
intravenous contrast administration.
CONTRAST:  75mL [id number] IOPAMIDOL ([id number]) INJECTION 61%

[Series 2: chest w/cm · axial · 0.80mm/px · z∈[+780,+1116]mm · 15 of 198 slices shown, 19 images]
[im 15/198  mediastinal]
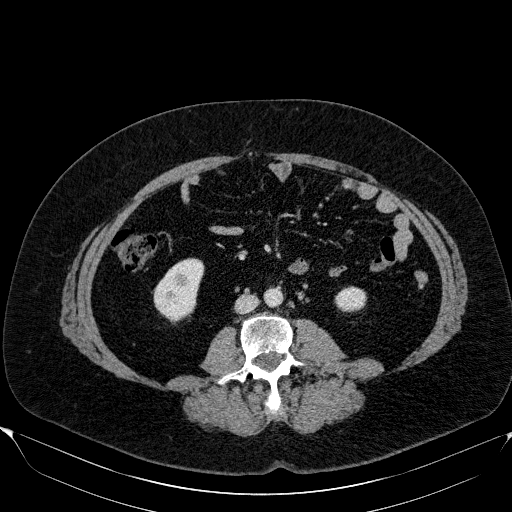
[im 15/198  lung]
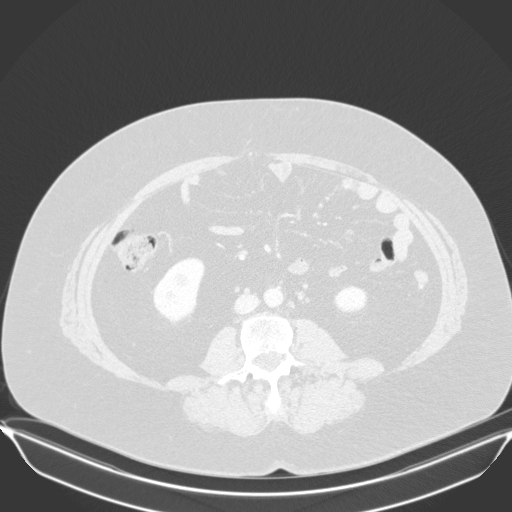
[im 30/198  lung]
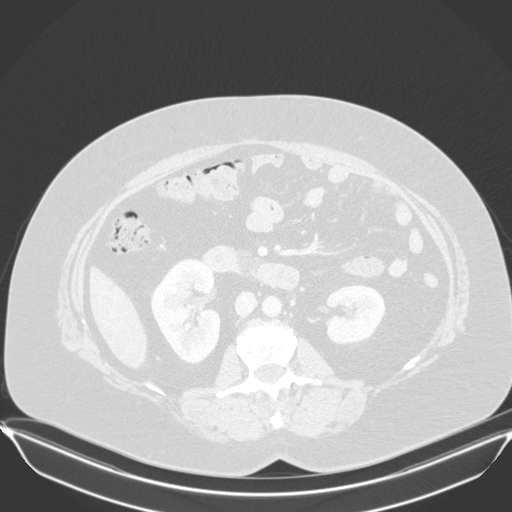
[im 40/198  lung]
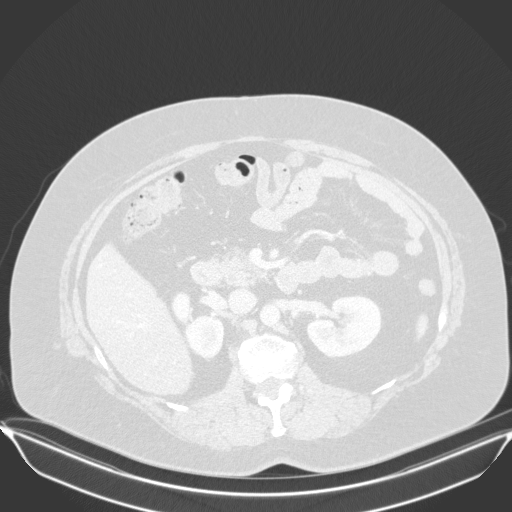
[im 52/198  lung]
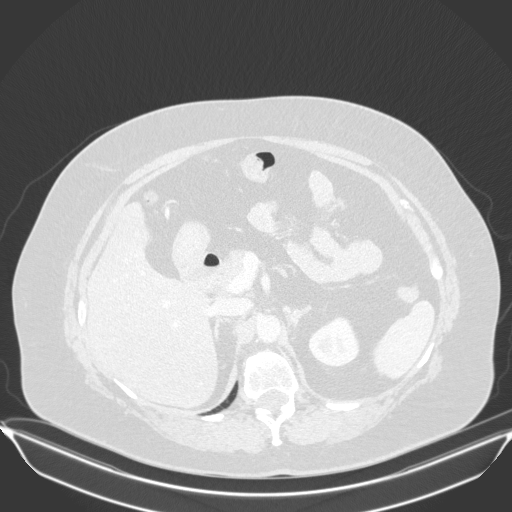
[im 66/198  mediastinal]
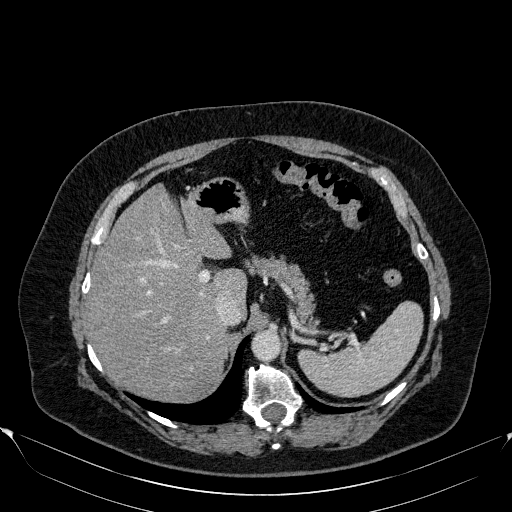
[im 66/198  lung]
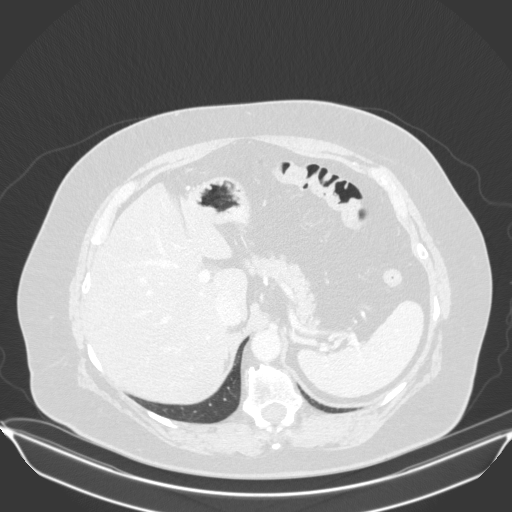
[im 79/198  lung]
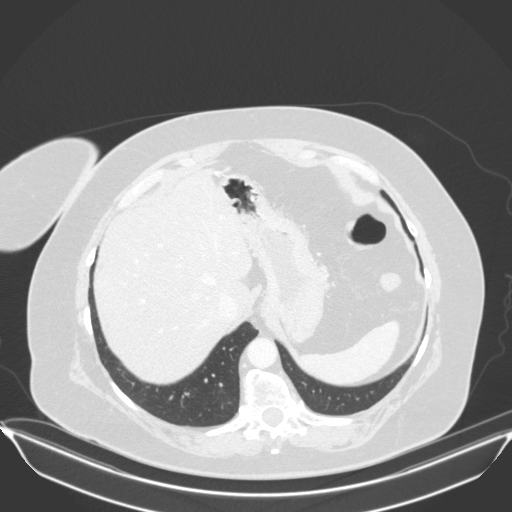
[im 88/198  lung]
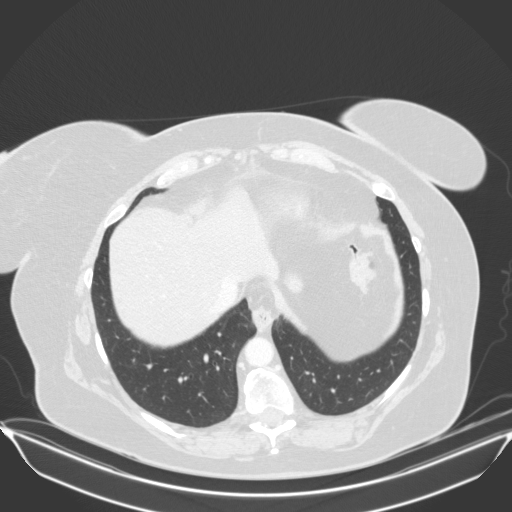
[im 103/198  lung]
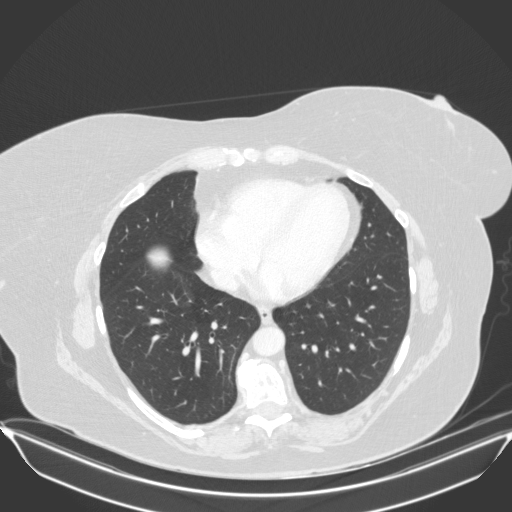
[im 110/198  mediastinal]
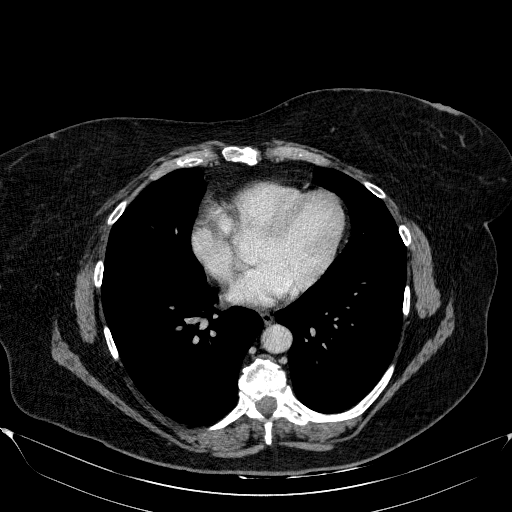
[im 110/198  lung]
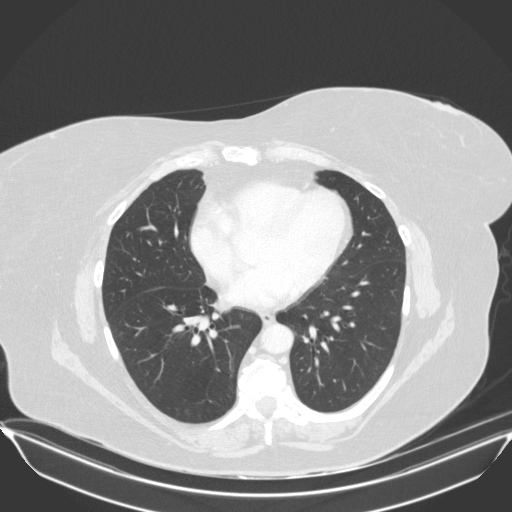
[im 119/198  lung]
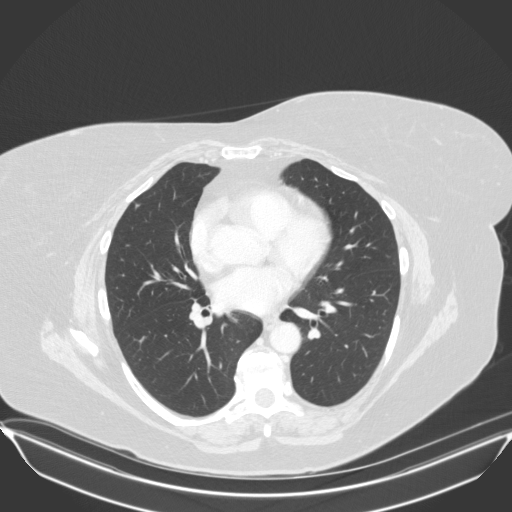
[im 132/198  lung]
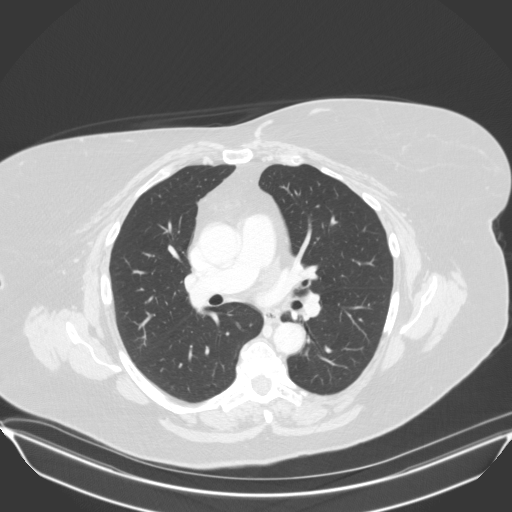
[im 146/198  lung]
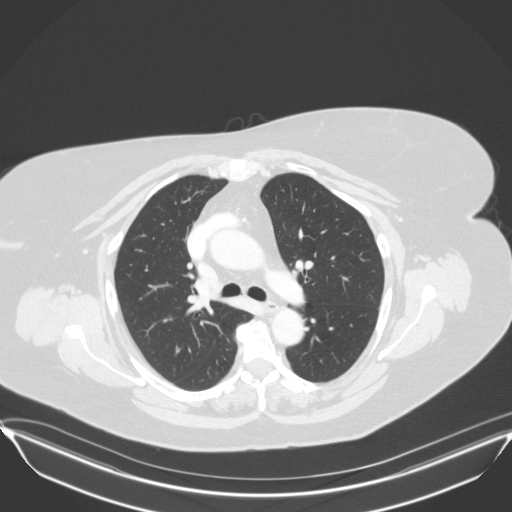
[im 158/198  mediastinal]
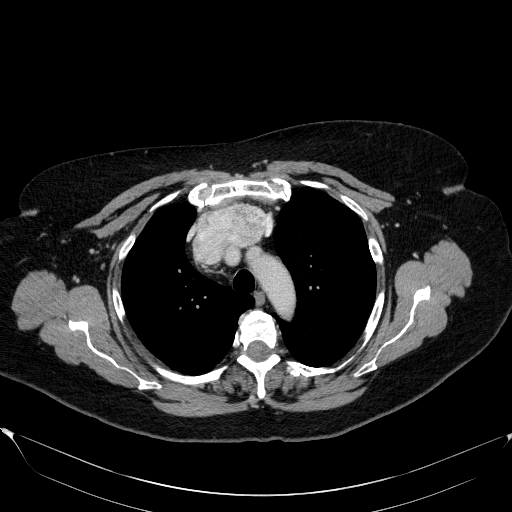
[im 158/198  lung]
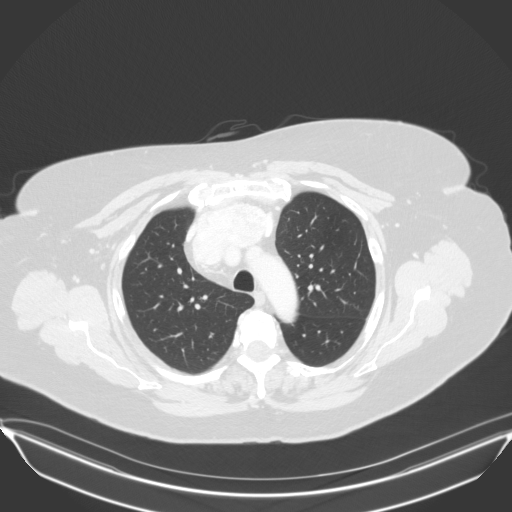
[im 168/198  lung]
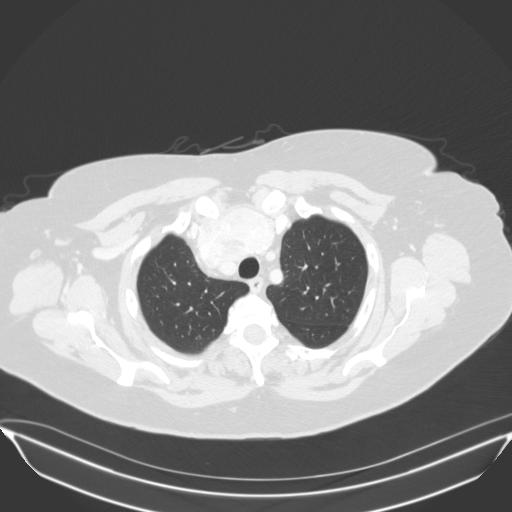
[im 183/198  lung]
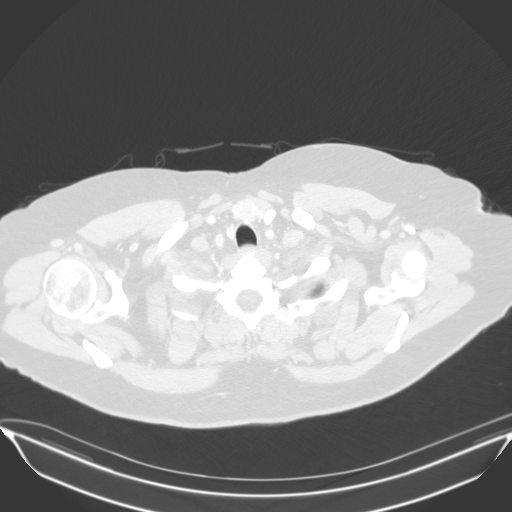

[15 of 33 positions shown; findings below may reference images not displayed]

FINDINGS: Cardiovascular: No significant vascular findings. Normal heart size.
No pericardial effusion.

Mediastinum/Nodes: No enlarged mediastinal, hilar, or axillary lymph
nodes. Small hiatal hernia. There is a large, exophytic nodule
arising from the thyroid isthmus, connected by a very thin stalk,
and extending into the anterior mediastinum, measuring 6.5 x 4.0 cm
(series 2, image 33). The remainder of the thyroid parenchyma is
somewhat atrophic. Trachea, and esophagus demonstrate no significant
findings.

Lungs/Pleura: Occasional small bilateral pulmonary nodules,
measuring no greater than 0.4 cm in the posterior right upper lobe
(series 5, image 69) and 0.4 cm in the left lung base (series 5,
image 104). No pleural effusion or pneumothorax.

Upper Abdomen: No acute abnormality. Hepatic steatosis. Status post
cholecystectomy.

Musculoskeletal: No chest wall mass or suspicious bone lesions
identified.
IMPRESSION: 1. There is a large, exophytic nodule arising from the thyroid
isthmus, connected by a very thin stalk, and extending into the
anterior mediastinum, measuring 6.5 x 4.0 cm, explaining
radiographic findings. Recommend thyroid ultrasound for further
evaluation (ref: [HOSPITAL]. [DATE]): 143-50).
2. Multiple small bilateral pulmonary nodules, measuring no greater
than 0.4 cm. In the absence of established primary malignancy
following further evaluation of the thyroid as above, no follow-up
needed if patient is low-risk. Non-contrast chest CT can be
considered in 12 months if patient is high-risk. This recommendation
follows the consensus statement: Guidelines for Management of
Incidental Pulmonary Nodules Detected on CT Images: From the
3. Small hiatal hernia.
4. Hepatic steatosis.

## 2022-03-20 DIAGNOSIS — E890 Postprocedural hypothyroidism: Secondary | ICD-10-CM | POA: Diagnosis not present

## 2022-03-21 ENCOUNTER — Encounter: Payer: Self-pay | Admitting: Internal Medicine

## 2022-03-21 NOTE — Telephone Encounter (Signed)
Spoke with patient over the phone for further  Clarification. PT states" She received a   message from Dr burns which I did not see in   the chart review encounter saying she needed  To be seen by a ENT.  I routed the message to burn to further discuss  With the patient.

## 2022-03-22 MED ORDER — LEVOTHYROXINE SODIUM 100 MCG PO TABS: 100.0000 ug | ORAL_TABLET | Freq: Every day | ORAL | 3 refills | Status: DC

## 2022-03-26 ENCOUNTER — Other Ambulatory Visit: Payer: Self-pay | Admitting: Internal Medicine

## 2022-04-02 DIAGNOSIS — R051 Acute cough: Secondary | ICD-10-CM | POA: Diagnosis not present

## 2022-04-02 DIAGNOSIS — Z20822 Contact with and (suspected) exposure to covid-19: Secondary | ICD-10-CM | POA: Diagnosis not present

## 2022-04-02 DIAGNOSIS — R509 Fever, unspecified: Secondary | ICD-10-CM | POA: Diagnosis not present

## 2022-04-02 DIAGNOSIS — I10 Essential (primary) hypertension: Secondary | ICD-10-CM | POA: Diagnosis not present

## 2022-04-09 NOTE — Progress Notes (Deleted)
   I, [name], LAT, ATC acting as a scribe for [name], MD.  Alyssa [name] is a 61 y.o. female who presents to [place] at [facility] today for follow-up thoracic and lumbar back pain and bilateral hand pain.  Of note, pt had thyroid nodule removed on 01/22/22.  Patient was last seen by Dr. Georgina Snell on 03/06/2022 and her gabapentin 100 was refilled and she was advised to use a heating pad and [facility].  A rheumatological work-up was also obtained. Today, patient reports  Dx testing: 03/06/22 R & L hand XR and labs 04/14/21 L knee XR             10/03/20 L knee MRI             08/23/20 L knee XR  Pertinent review of systems: ***  Relevant historical information: ***   Exam:  There were no vitals taken for this visit. General: Well Developed, well nourished, and in no acute distress.   MSK: ***    Lab and Radiology Results No results found for this or any previous visit (from the past 72 hour(s)). No results found.     Assessment and Plan: 61 y.o. female with ***   PDMP not reviewed this encounter. No orders of the defined types were placed in this encounter.  No orders of the defined types were placed in this encounter.    Discussed warning signs or symptoms. Please see discharge instructions. Patient expresses understanding.   ***

## 2022-04-10 ENCOUNTER — Ambulatory Visit: Payer: BC Managed Care – PPO | Admitting: Family Medicine

## 2022-04-17 ENCOUNTER — Encounter: Payer: Self-pay | Admitting: Internal Medicine

## 2022-04-18 ENCOUNTER — Ambulatory Visit: Payer: BC Managed Care – PPO | Admitting: Family Medicine

## 2022-04-23 ENCOUNTER — Other Ambulatory Visit: Payer: Self-pay | Admitting: Internal Medicine

## 2022-04-23 NOTE — Progress Notes (Deleted)
[place] Phone: [phone] Subjective:    I'm seeing this patient by the request  of:  Binnie Rail, MD  CC:   [id number]  10/03/2021 Had replacement.  Doing relatively well.  Can continue to monitor at this point.  Can follow-up with me in 3 months if needed.  Wants refill of the meloxicam by primary care provider.  Update 04/24/2022 Danijah Noh is a 61 y.o. female coming in with complaint of R hip pain. Saw Dr. Georgina Snell for hip pain in September 2023. 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   Pre-diabetes    SVD (spontaneous vaginal delivery)    x 3   Past Surgical History:  Procedure Laterality Date   ABDOMINAL HYSTERECTOMY     APPENDECTOMY     with hysterectomy   ENDOMETRIAL ABLATION  2002   LAPAROSCOPIC CHOLECYSTECTOMY  12/09/2016   THYROID LOBECTOMY N/A 01/22/2022   Procedure: THYROID ISTHMUSECTOMY WITH RESECTION OF SUBSTERNAL MASS;  Surgeon: Armandina Gemma, MD;  Location: [facility];  Service: General;  Laterality: N/A;   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   Procedure: LEFT TOTAL KNEE ARTHROPLASTY;  Surgeon: Melrose Nakayama, MD;  Location: [facility];  Service: Orthopedics;  Laterality: Left;   TUBAL LIGATION  1991   WISDOM TOOTH EXTRACTION     Social History   Socioeconomic History   Marital status: Married    Spouse name: Not on file   Number of children: 3   Years of education: Not on file   Highest education level: Not on file  Occupational History   Not on file  Tobacco Use   Smoking status: Never   Smokeless tobacco: Never  Vaping Use   Vaping Use: Never used  Substance and Sexual Activity   Alcohol use: No   Drug use: No   Sexual activity: Yes    Birth control/protection: Post-menopausal     Comment: Hysterctomy  Other Topics Concern   Not on file  Social History Narrative   Not on file   Social Determinants of Health   Financial Resource Strain: Not on file  Food Insecurity: Not on file  Transportation Needs: Not on file  Physical Activity: Not on file  Stress: Not on file  Social Connections: Not on file   Allergies  Allergen Reactions   Codeine Itching   Doxycycline Other (See Comments)    chest pain   Penicillins Nausea And Vomiting   Prednisone Anxiety    severe anxiety   Family History  Problem Relation Age of Onset   Cancer Mother 43       vulvar cancer   Lung cancer Mother 73   Stroke Father    Cancer - Lung Sister    Suicidality Sister    Suicidality Brother    Arthritis Brother    Prostate cancer Maternal Uncle    Prostate cancer Maternal Uncle    Stroke Maternal Grandmother    Prostate cancer Maternal Grandfather    Cervical cancer Paternal Grandmother    Throat cancer Paternal Grandfather    Colon cancer Neg Hx    Rectal cancer Neg Hx    Stomach cancer Neg Hx     Current Outpatient  Medications (Endocrine & Metabolic):    levothyroxine (SYNTHROID) 100 MCG tablet, Take 1 tablet (100 mcg total) by mouth daily.  Current Outpatient Medications (Cardiovascular):    amLODipine (NORVASC) 5 MG tablet, TAKE 1 TABLET BY MOUTH  DAILY (Patient taking differently: Take 5 mg by mouth every morning.)   losartan (COZAAR) 50 MG tablet, TAKE 1 TABLET BY MOUTH  DAILY (Patient taking differently: Take 50 mg by mouth every morning.)  Current Outpatient Medications (Respiratory):    diphenhydrAMINE (BENADRYL) 25 MG tablet, Take 50 mg by mouth at bedtime as needed for sleep.  Current Outpatient Medications (Analgesics):    meloxicam (MOBIC) 15 MG tablet, TAKE 1 TABLET BY MOUTH ONCE DAILY AS NEEDED FOR PAIN   Current Outpatient Medications (Other):    DULoxetine (CYMBALTA) 30 MG capsule, TAKE 1 CAPSULE BY MOUTH  DAILY (Patient taking differently: Take 30 mg  by mouth at bedtime.)   gabapentin (NEURONTIN) 100 MG capsule, Take 1 capsule (100 mg total) by mouth 2 (two) times daily as needed (nerve pain).   gabapentin (NEURONTIN) 300 MG capsule, Take 1 capsule (300 mg total) by mouth at bedtime.   omeprazole (PRILOSEC) 20 MG capsule, TAKE 1 CAPSULE BY MOUTH  DAILY (Patient taking differently: Take 20 mg by mouth every morning.)   tiZANidine (ZANAFLEX) 4 MG tablet, TAKE 1 TABLET BY MOUTH EVERY 6 HOURS AS NEEDED FOR MUSCLE SPASM   Reviewed prior external information including notes and imaging from  primary care provider As well as notes that were available from care everywhere and other healthcare systems.  Past medical history, social, surgical and family history all reviewed in electronic medical record.  No pertanent information unless stated regarding to the chief complaint.   Review of Systems:  No headache, visual changes, nausea, vomiting, diarrhea, constipation, dizziness, abdominal pain, skin rash, fevers, chills, night sweats, weight loss, swollen lymph nodes, body aches, joint swelling, chest pain, shortness of breath, mood changes. POSITIVE muscle aches  Objective  There were no vitals taken for this visit.   General: No apparent distress alert and oriented x3 mood and affect normal, dressed appropriately.  HEENT: Pupils equal, extraocular movements intact  Respiratory: Patient's speak in full sentences and does not appear short of breath  Cardiovascular: No lower extremity edema, non tender, no erythema      Impression and Recommendations:

## 2022-04-24 ENCOUNTER — Ambulatory Visit: Payer: BC Managed Care – PPO | Admitting: Family Medicine

## 2022-05-21 ENCOUNTER — Other Ambulatory Visit: Payer: Self-pay | Admitting: Internal Medicine

## 2022-05-21 DIAGNOSIS — N838 Other noninflammatory disorders of ovary, fallopian tube and broad ligament: Secondary | ICD-10-CM

## 2022-05-29 ENCOUNTER — Other Ambulatory Visit: Payer: Self-pay | Admitting: Internal Medicine

## 2022-06-01 NOTE — Progress Notes (Unsigned)
[name] [place] Phone: [phone] Subjective:   Alyssa [name], am serving as a scribe for Dr. Hulan Saas.  I'm seeing this patient by the request  of:  Alyssa Rail, MD  CC: Left knee pain, right hip pain  [id number]  10/03/2021 Had replacement.  Doing relatively well.  Can continue to monitor at this point.  Can follow-up with me in 3 months if needed.  Wants refill of the meloxicam by primary care provider.     Enlarged, post sternal noted.  Will refer to general surgery to discuss with patient patient will be referred today.   Update 06/05/2022 Alyssa [name] is a 61 y.o. female coming in with complaint of L knee pain. Patient states the knee is doing well except for the swelling she will get sometimes. The right hip is bothersome but not all the time, today it is good but some days she will get the pain and it travels down the right leg.   Since we have seen patient patient has had a partial thyroidectomy and states that she is doing relatively well.     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   Pre-diabetes    SVD (spontaneous vaginal delivery)    x 3   Past Surgical History:  Procedure Laterality Date   ABDOMINAL HYSTERECTOMY     APPENDECTOMY     with hysterectomy   ENDOMETRIAL ABLATION  2002   LAPAROSCOPIC CHOLECYSTECTOMY  12/09/2016   THYROID LOBECTOMY N/A 01/22/2022   Procedure: THYROID ISTHMUSECTOMY WITH RESECTION OF SUBSTERNAL MASS;  Surgeon: Armandina Gemma, MD;  Location: [facility];  Service: General;  Laterality: N/A;   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   Procedure: LEFT TOTAL KNEE ARTHROPLASTY;  Surgeon: Melrose Nakayama, MD;  Location: [facility];  Service: Orthopedics;  Laterality: Left;   TUBAL LIGATION  1991   WISDOM  TOOTH EXTRACTION     Social History   Socioeconomic History   Marital status: Married    Spouse name: Not on file   Number of children: 3   Years of education: Not on file   Highest education level: Not on file  Occupational History   Not on file  Tobacco Use   Smoking status: Never   Smokeless tobacco: Never  Vaping Use   Vaping Use: Never used  Substance and Sexual Activity   Alcohol use: No   Drug use: No   Sexual activity: Yes    Birth control/protection: Post-menopausal    Comment: Hysterctomy  Other Topics Concern   Not on file  Social History Narrative   Not on file   Social Determinants of Health   Financial Resource Strain: Not on file  Food Insecurity: Not on file  Transportation Needs: Not on file  Physical Activity: Not on file  Stress: Not on file  Social Connections: Not on file   Allergies  Allergen Reactions   Codeine Itching   Doxycycline Other (See Comments)    chest pain   Penicillins Nausea And Vomiting   Prednisone Anxiety    severe anxiety   Family History  Problem Relation Age of Onset   Cancer Mother 79       vulvar cancer   Lung cancer Mother 24  Stroke Father    Cancer - Lung Sister    Suicidality Sister    Suicidality Brother    Arthritis Brother    Prostate cancer Maternal Uncle    Prostate cancer Maternal Uncle    Stroke Maternal Grandmother    Prostate cancer Maternal Grandfather    Cervical cancer Paternal Grandmother    Throat cancer Paternal Grandfather    Colon cancer Neg Hx    Rectal cancer Neg Hx    Stomach cancer Neg Hx     Current Outpatient Medications (Endocrine & Metabolic):    levothyroxine (SYNTHROID) 100 MCG tablet, Take 1 tablet (100 mcg total) by mouth daily.  Current Outpatient Medications (Cardiovascular):    amLODipine (NORVASC) 5 MG tablet, TAKE 1 TABLET BY MOUTH  DAILY (Patient taking differently: Take 5 mg by mouth every morning.)   losartan (COZAAR) 50 MG tablet, TAKE 1 TABLET BY MOUTH  DAILY  (Patient taking differently: Take 50 mg by mouth every morning.)  Current Outpatient Medications (Respiratory):    diphenhydrAMINE (BENADRYL) 25 MG tablet, Take 50 mg by mouth at bedtime as needed for sleep.  Current Outpatient Medications (Analgesics):    meloxicam (MOBIC) 15 MG tablet, TAKE 1 TABLET BY MOUTH ONCE DAILY AS NEEDED FOR PAIN   Current Outpatient Medications (Other):    DULoxetine (CYMBALTA) 30 MG capsule, TAKE 1 CAPSULE BY MOUTH  DAILY (Patient taking differently: Take 30 mg by mouth at bedtime.)   gabapentin (NEURONTIN) 100 MG capsule, Take 1 capsule (100 mg total) by mouth 2 (two) times daily as needed (nerve pain).   gabapentin (NEURONTIN) 300 MG capsule, Take 1 capsule (300 mg total) by mouth at bedtime.   omeprazole (PRILOSEC) 20 MG capsule, TAKE 1 CAPSULE BY MOUTH DAILY   tiZANidine (ZANAFLEX) 4 MG tablet, TAKE 1 TABLET BY MOUTH EVERY 6 HOURS AS NEEDED FOR MUSCLE SPASM   Reviewed prior external information including notes and imaging from  primary care provider As well as notes that were available from care everywhere and other healthcare systems.  Past medical history, social, surgical and family history all reviewed in electronic medical record.  No pertanent information unless stated regarding to the chief complaint.   Review of Systems:  No headache, visual changes, nausea, vomiting, diarrhea, constipation, dizziness, abdominal pain, skin rash, fevers, chills, night sweats, weight loss, swollen lymph nodes, body aches, joint swelling, chest pain, shortness of breath, mood changes. POSITIVE muscle aches  Objective  Blood pressure 132/84, pulse 82, height '5\' 1"'$  (1.549 m), weight 200 lb (90.7 kg), SpO2 94 %.   General: No apparent distress alert and oriented x3 mood and affect normal, dressed appropriately.  HEENT: Pupils equal, extraocular movements intact  Respiratory: Patient's speak in full sentences and does not appear short of breath  Cardiovascular: No  lower extremity edema, non tender, no erythema  Mild antalgic gait noted.  Tenderness to palpation over the greater trochanteric area right greater than left.  Negative straight leg test.   After verbal consent patient was prepped with alcohol swab and with a 21-gauge 2 inch needle injected into the right greater trochanteric area with 2 cc of 0.5% Marcaine and 1 cc of Kenalog 40 mg/mL.  No blood loss.  Band-Aid placed.  Postinjection instructions given    Impression and Recommendations:     The above documentation has been reviewed and is accurate and complete Lyndal Pulley, DO

## 2022-06-05 ENCOUNTER — Ambulatory Visit (INDEPENDENT_AMBULATORY_CARE_PROVIDER_SITE_OTHER): Payer: BC Managed Care – PPO | Admitting: Family Medicine

## 2022-06-05 VITALS — BP 132/84 | HR 82 | Ht 61.0 in | Wt 200.0 lb

## 2022-06-05 DIAGNOSIS — R7989 Other specified abnormal findings of blood chemistry: Secondary | ICD-10-CM

## 2022-06-05 DIAGNOSIS — E890 Postprocedural hypothyroidism: Secondary | ICD-10-CM | POA: Diagnosis not present

## 2022-06-05 DIAGNOSIS — E041 Nontoxic single thyroid nodule: Secondary | ICD-10-CM | POA: Diagnosis not present

## 2022-06-05 DIAGNOSIS — M7061 Trochanteric bursitis, right hip: Secondary | ICD-10-CM

## 2022-06-05 LAB — TSH: TSH: 4.1 u[IU]/mL (ref 0.35–5.50)

## 2022-06-05 LAB — T4, FREE: Free T4: 0.7 ng/dL (ref 0.60–1.60)

## 2022-06-05 LAB — T3, FREE: T3, Free: 3.8 pg/mL (ref 2.3–4.2)

## 2022-06-05 NOTE — Patient Instructions (Signed)
Good to see you Injection given in hip today Get labs on your way out. Burns placed the TSH and I placed the T4 T3.  Follow up 2 months if needed

## 2022-06-05 NOTE — Assessment & Plan Note (Signed)
Patient given injection and tolerated the procedure well, discussed icing regimen and home exercises, which activities to do and which ones to avoid, increase activity slowly otherwise.  Follow-up again in 6 to 8 weeks.  Patient will continue to work on core strengthening.  I anticipate patient doing well overall.

## 2022-06-06 ENCOUNTER — Other Ambulatory Visit: Payer: Self-pay | Admitting: Internal Medicine

## 2022-06-06 DIAGNOSIS — E890 Postprocedural hypothyroidism: Secondary | ICD-10-CM

## 2022-06-12 ENCOUNTER — Other Ambulatory Visit (HOSPITAL_BASED_OUTPATIENT_CLINIC_OR_DEPARTMENT_OTHER): Payer: Self-pay | Admitting: Internal Medicine

## 2022-06-12 DIAGNOSIS — Z1231 Encounter for screening mammogram for malignant neoplasm of breast: Secondary | ICD-10-CM

## 2022-06-15 ENCOUNTER — Ambulatory Visit (HOSPITAL_BASED_OUTPATIENT_CLINIC_OR_DEPARTMENT_OTHER): Payer: BC Managed Care – PPO

## 2022-06-19 ENCOUNTER — Ambulatory Visit (HOSPITAL_BASED_OUTPATIENT_CLINIC_OR_DEPARTMENT_OTHER)
Admission: RE | Admit: 2022-06-19 | Discharge: 2022-06-19 | Disposition: A | Discharge status: Home / Self Care | Payer: BC Managed Care – PPO | Source: Ambulatory Visit | Attending: Internal Medicine | Admitting: Internal Medicine

## 2022-06-19 ENCOUNTER — Encounter (HOSPITAL_BASED_OUTPATIENT_CLINIC_OR_DEPARTMENT_OTHER): Payer: Self-pay

## 2022-06-19 DIAGNOSIS — R918 Other nonspecific abnormal finding of lung field: Secondary | ICD-10-CM | POA: Diagnosis not present

## 2022-06-19 DIAGNOSIS — Z1231 Encounter for screening mammogram for malignant neoplasm of breast: Secondary | ICD-10-CM | POA: Diagnosis not present

## 2022-06-22 ENCOUNTER — Other Ambulatory Visit: Payer: Self-pay | Admitting: Internal Medicine

## 2022-06-26 DIAGNOSIS — R0602 Shortness of breath: Secondary | ICD-10-CM | POA: Diagnosis not present

## 2022-06-26 DIAGNOSIS — R509 Fever, unspecified: Secondary | ICD-10-CM | POA: Diagnosis not present

## 2022-06-26 DIAGNOSIS — R051 Acute cough: Secondary | ICD-10-CM | POA: Diagnosis not present

## 2022-07-12 ENCOUNTER — Other Ambulatory Visit: Payer: Self-pay | Admitting: Internal Medicine

## 2022-07-24 ENCOUNTER — Other Ambulatory Visit: Payer: Self-pay

## 2022-07-24 ENCOUNTER — Other Ambulatory Visit: Payer: Self-pay | Admitting: Internal Medicine

## 2022-07-29 ENCOUNTER — Other Ambulatory Visit: Payer: Self-pay | Admitting: Internal Medicine

## 2022-08-01 ENCOUNTER — Other Ambulatory Visit: Payer: Self-pay | Admitting: Internal Medicine

## 2022-08-01 NOTE — Progress Notes (Deleted)
[place] Phone: [phone] Subjective:    I'm seeing this patient by the request  of:  Binnie Rail, MD  CC:   [id number]  06/05/2022 Patient given injection and tolerated the procedure well, discussed icing regimen and home exercises, which activities to do and which ones to avoid, increase activity slowly otherwise.  Follow-up again in 6 to 8 weeks.  Patient will continue to work on core strengthening.  I anticipate patient doing well overall.      Update 08/07/2022 Alyssa [name] is a 62 y.o. female coming in with complaint of R hip pain. 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   Pre-diabetes    SVD (spontaneous vaginal delivery)    x 3   Past Surgical History:  Procedure Laterality Date   ABDOMINAL HYSTERECTOMY     APPENDECTOMY     with hysterectomy   ENDOMETRIAL ABLATION  2002   LAPAROSCOPIC CHOLECYSTECTOMY  12/09/2016   THYROID LOBECTOMY N/A 01/22/2022   Procedure: THYROID ISTHMUSECTOMY WITH RESECTION OF SUBSTERNAL MASS;  Surgeon: Armandina Gemma, MD;  Location: [facility];  Service: General;  Laterality: N/A;   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   Procedure: LEFT TOTAL KNEE ARTHROPLASTY;  Surgeon: Melrose Nakayama, MD;  Location: [facility];  Service: Orthopedics;  Laterality: Left;   TUBAL LIGATION  1991   WISDOM TOOTH EXTRACTION     Social History   Socioeconomic History   Marital status: Married    Spouse name: Not on file   Number of children: 3   Years of education: Not on file   Highest education level: Not on file  Occupational History   Not on file  Tobacco Use   Smoking status: Never   Smokeless tobacco: Never  Vaping Use   Vaping Use: Never used  Substance and Sexual Activity   Alcohol use:  No   Drug use: No   Sexual activity: Yes    Birth control/protection: Post-menopausal    Comment: Hysterctomy  Other Topics Concern   Not on file  Social History Narrative   Not on file   Social Determinants of Health   Financial Resource Strain: Not on file  Food Insecurity: Not on file  Transportation Needs: Not on file  Physical Activity: Not on file  Stress: Not on file  Social Connections: Not on file   Allergies  Allergen Reactions   Codeine Itching   Doxycycline Other (See Comments)    chest pain   Penicillins Nausea And Vomiting   Prednisone Anxiety    severe anxiety   Family History  Problem Relation Age of Onset   Cancer Mother 3       vulvar cancer   Lung cancer Mother 22   Stroke Father    Cancer - Lung Sister    Suicidality Sister    Suicidality Brother    Arthritis Brother    Prostate cancer Maternal Uncle    Prostate cancer Maternal Uncle    Stroke Maternal Grandmother    Prostate cancer Maternal Grandfather    Cervical cancer Paternal Grandmother    Throat cancer Paternal Grandfather    Colon cancer Neg Hx    Rectal cancer Neg Hx  Stomach cancer Neg Hx     Current Outpatient Medications (Endocrine & Metabolic):    levothyroxine (SYNTHROID) 100 MCG tablet, Take 1 tablet (100 mcg total) by mouth daily.  Current Outpatient Medications (Cardiovascular):    amLODipine (NORVASC) 5 MG tablet, TAKE 1 TABLET BY MOUTH DAILY   losartan (COZAAR) 50 MG tablet, TAKE 1 TABLET BY MOUTH DAILY  Current Outpatient Medications (Respiratory):    diphenhydrAMINE (BENADRYL) 25 MG tablet, Take 50 mg by mouth at bedtime as needed for sleep.  Current Outpatient Medications (Analgesics):    meloxicam (MOBIC) 15 MG tablet, TAKE 1 TABLET BY MOUTH ONCE DAILY AS NEEDED FOR PAIN   Current Outpatient Medications (Other):    DULoxetine (CYMBALTA) 30 MG capsule, TAKE 1 CAPSULE BY MOUTH DAILY   gabapentin (NEURONTIN) 100 MG capsule, Take 1 capsule (100 mg total) by  mouth 2 (two) times daily as needed (nerve pain).   gabapentin (NEURONTIN) 300 MG capsule, Take 1 capsule (300 mg total) by mouth at bedtime.   omeprazole (PRILOSEC) 20 MG capsule, TAKE 1 CAPSULE BY MOUTH DAILY   tiZANidine (ZANAFLEX) 4 MG tablet, TAKE 1 TABLET BY MOUTH EVERY 6 HOURS AS NEEDED FOR MUSCLE SPASM   Reviewed prior external information including notes and imaging from  primary care provider As well as notes that were available from care everywhere and other healthcare systems.  Past medical history, social, surgical and family history all reviewed in electronic medical record.  No pertanent information unless stated regarding to the chief complaint.   Review of Systems:  No headache, visual changes, nausea, vomiting, diarrhea, constipation, dizziness, abdominal pain, skin rash, fevers, chills, night sweats, weight loss, swollen lymph nodes, body aches, joint swelling, chest pain, shortness of breath, mood changes. POSITIVE muscle aches  Objective  There were no vitals taken for this visit.   General: No apparent distress alert and oriented x3 mood and affect normal, dressed appropriately.  HEENT: Pupils equal, extraocular movements intact  Respiratory: Patient's speak in full sentences and does not appear short of breath  Cardiovascular: No lower extremity edema, non tender, no erythema      Impression and Recommendations:

## 2022-08-07 ENCOUNTER — Ambulatory Visit: Payer: BC Managed Care – PPO | Admitting: Family Medicine

## 2022-08-26 ENCOUNTER — Other Ambulatory Visit: Payer: Self-pay | Admitting: Internal Medicine

## 2022-08-27 ENCOUNTER — Ambulatory Visit: Payer: BC Managed Care – PPO | Admitting: Family Medicine

## 2022-08-27 NOTE — Progress Notes (Deleted)
[place] Phone: [phone] Subjective:    I'm seeing this patient by the request  of:  Binnie Rail, MD  CC: Follow-up for multiple complaints  [id number]  06/05/2022 Patient given injection and tolerated the procedure well, discussed icing regimen and home exercises, which activities to do and which ones to avoid, increase activity slowly otherwise. Follow-up again in 6 to 8 weeks. Patient will continue to work on core strengthening. I anticipate patient doing well overall    Alyssa [name] is a 62 y.o. female coming in with complaint of multiple pain areas, hip and back pain.  Patient at last exam in December did have a greater trochanteric bursitis.  Given injection at that time.  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   Pre-diabetes    SVD (spontaneous vaginal delivery)    x 3   Past Surgical History:  Procedure Laterality Date   ABDOMINAL HYSTERECTOMY     APPENDECTOMY     with hysterectomy   ENDOMETRIAL ABLATION  2002   LAPAROSCOPIC CHOLECYSTECTOMY  12/09/2016   THYROID LOBECTOMY N/A 01/22/2022   Procedure: THYROID ISTHMUSECTOMY WITH RESECTION OF SUBSTERNAL MASS;  Surgeon: Armandina Gemma, MD;  Location: [facility];  Service: General;  Laterality: N/A;   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   Procedure: LEFT TOTAL KNEE ARTHROPLASTY;  Surgeon: Melrose Nakayama, MD;  Location: [facility];  Service: Orthopedics;  Laterality: Left;   TUBAL LIGATION  1991   WISDOM TOOTH EXTRACTION     Social History   Socioeconomic History   Marital status: Married    Spouse name: Not on file   Number of children: 3   Years of education: Not on file   Highest education level: Not on file  Occupational History   Not on file  Tobacco  Use   Smoking status: Never   Smokeless tobacco: Never  Vaping Use   Vaping Use: Never used  Substance and Sexual Activity   Alcohol use: No   Drug use: No   Sexual activity: Yes    Birth control/protection: Post-menopausal    Comment: Hysterctomy  Other Topics Concern   Not on file  Social History Narrative   Not on file   Social Determinants of Health   Financial Resource Strain: Not on file  Food Insecurity: Not on file  Transportation Needs: Not on file  Physical Activity: Not on file  Stress: Not on file  Social Connections: Not on file   Allergies  Allergen Reactions   Codeine Itching   Doxycycline Other (See Comments)    chest pain   Penicillins Nausea And Vomiting   Prednisone Anxiety    severe anxiety   Family History  Problem Relation Age of Onset   Cancer Mother 65       vulvar cancer   Lung cancer Mother 47   Stroke Father    Cancer - Lung Sister    Suicidality Sister    Suicidality Brother    Arthritis Brother    Prostate cancer Maternal Uncle    Prostate cancer Maternal Uncle    Stroke Maternal Grandmother    Prostate cancer Maternal Grandfather    Cervical cancer Paternal Grandmother  Throat cancer Paternal Grandfather    Colon cancer Neg Hx    Rectal cancer Neg Hx    Stomach cancer Neg Hx     Current Outpatient Medications (Endocrine & Metabolic):    levothyroxine (SYNTHROID) 100 MCG tablet, Take 1 tablet (100 mcg total) by mouth daily.  Current Outpatient Medications (Cardiovascular):    amLODipine (NORVASC) 5 MG tablet, TAKE 1 TABLET BY MOUTH DAILY   losartan (COZAAR) 50 MG tablet, TAKE 1 TABLET BY MOUTH DAILY  Current Outpatient Medications (Respiratory):    diphenhydrAMINE (BENADRYL) 25 MG tablet, Take 50 mg by mouth at bedtime as needed for sleep.  Current Outpatient Medications (Analgesics):    meloxicam (MOBIC) 15 MG tablet, TAKE 1 TABLET BY MOUTH ONCE DAILY AS NEEDED FOR PAIN   Current Outpatient Medications (Other):     DULoxetine (CYMBALTA) 30 MG capsule, TAKE 1 CAPSULE BY MOUTH DAILY   gabapentin (NEURONTIN) 100 MG capsule, Take 1 capsule (100 mg total) by mouth 2 (two) times daily as needed (nerve pain).   gabapentin (NEURONTIN) 300 MG capsule, Take 1 capsule (300 mg total) by mouth at bedtime.   omeprazole (PRILOSEC) 20 MG capsule, TAKE 1 CAPSULE BY MOUTH DAILY   tiZANidine (ZANAFLEX) 4 MG tablet, TAKE 1 TABLET BY MOUTH EVERY 6 HOURS AS NEEDED FOR MUSCLE SPASM   Reviewed prior external information including notes and imaging from  primary care provider As well as notes that were available from care everywhere and other healthcare systems.  Past medical history, social, surgical and family history all reviewed in electronic medical record.  No pertanent information unless stated regarding to the chief complaint.   Review of Systems:  No headache, visual changes, nausea, vomiting, diarrhea, constipation, dizziness, abdominal pain, skin rash, fevers, chills, night sweats, weight loss, swollen lymph nodes, body aches, joint swelling, chest pain, shortness of breath, mood changes. POSITIVE muscle aches  Objective  There were no vitals taken for this visit.   General: No apparent distress alert and oriented x3 mood and affect normal, dressed appropriately.  HEENT: Pupils equal, extraocular movements intact  Respiratory: Patient's speak in full sentences and does not appear short of breath  Cardiovascular: No lower extremity edema, non tender, no erythema      Impression and Recommendations:     The above documentation has been reviewed and is accurate and complete Lyndal Pulley, DO

## 2022-08-29 ENCOUNTER — Ambulatory Visit: Payer: BC Managed Care – PPO | Admitting: Family Medicine

## 2022-08-30 ENCOUNTER — Encounter: Payer: Self-pay | Admitting: Internal Medicine

## 2022-08-30 NOTE — Progress Notes (Signed)
      Subjective:    Patient ID: Alyssa [name], female    DOB: 07-08-1960, 62 y.o.   MRN: [id number]     HPI Alyssa [name] is here for follow up of her chronic medical problems, including htn, prediabetes, hypothyroid, GERD, anxiety/depression    Medications and allergies reviewed with patient and updated if appropriate.  Current Outpatient Medications on File Prior to Visit  Medication Sig Dispense Refill   amLODipine (NORVASC) 5 MG tablet TAKE 1 TABLET BY MOUTH DAILY 90 tablet 3   diphenhydrAMINE (BENADRYL) 25 MG tablet Take 50 mg by mouth at bedtime as needed for sleep.     DULoxetine (CYMBALTA) 30 MG capsule TAKE 1 CAPSULE BY MOUTH DAILY 90 capsule 2   gabapentin (NEURONTIN) 100 MG capsule Take 1 capsule (100 mg total) by mouth 2 (two) times daily as needed (nerve pain). 90 capsule 3   gabapentin (NEURONTIN) 300 MG capsule Take 1 capsule (300 mg total) by mouth at bedtime. 90 capsule 2   levothyroxine (SYNTHROID) 100 MCG tablet Take 1 tablet (100 mcg total) by mouth daily. 90 tablet 3   losartan (COZAAR) 50 MG tablet TAKE 1 TABLET BY MOUTH DAILY 90 tablet 2   meloxicam (MOBIC) 15 MG tablet TAKE 1 TABLET BY MOUTH ONCE DAILY AS NEEDED FOR PAIN 30 tablet 0   omeprazole (PRILOSEC) 20 MG capsule TAKE 1 CAPSULE BY MOUTH DAILY 90 capsule 3   tiZANidine (ZANAFLEX) 4 MG tablet TAKE 1 TABLET BY MOUTH EVERY 6 HOURS AS NEEDED FOR MUSCLE SPASM 40 tablet 0   No current facility-administered medications on file prior to visit.     Review of Systems     Objective:  There were no vitals filed for this visit. BP Readings from Last 3 Encounters:  06/05/22 132/84  03/06/22 120/82  03/06/22 122/84   Wt Readings from Last 3 Encounters:  06/05/22 200 lb (90.7 kg)  03/06/22 195 lb (88.5 kg)  03/06/22 196 lb (88.9 kg)   There is no height or weight on file to calculate BMI.    Physical Exam     Lab Results  Component Value Date   WBC 3.6 (L) 03/06/2022   HGB 14.4 03/06/2022   HCT  42.6 03/06/2022   PLT 243.0 03/06/2022   GLUCOSE 101 (H) 03/06/2022   CHOL 203 (H) 03/06/2022   TRIG 64.0 03/06/2022   HDL 70.60 03/06/2022   LDLCALC 120 (H) 03/06/2022   ALT 21 03/06/2022   AST 26 03/06/2022   NA 141 03/06/2022   K 4.1 03/06/2022   CL 102 03/06/2022   CREATININE 1.04 03/06/2022   BUN 19 03/06/2022   CO2 29 03/06/2022   TSH 4.10 06/05/2022   INR 1.0 06/08/2021   HGBA1C 6.2 03/06/2022     Assessment & Plan:    See Problem List for Assessment and Plan of chronic medical problems.   This encounter was created in error - please disregard.

## 2022-08-30 NOTE — Patient Instructions (Addendum)
      Blood work was ordered.   The lab is on the first floor.    Medications changes include :       A referral was ordered for XXX.     Someone will call you to schedule an appointment.    Return in about 6 months (around 03/03/2023) for Physical Exam.

## 2022-08-31 ENCOUNTER — Encounter: Payer: BC Managed Care – PPO | Admitting: Internal Medicine

## 2022-08-31 DIAGNOSIS — F419 Anxiety disorder, unspecified: Secondary | ICD-10-CM

## 2022-08-31 DIAGNOSIS — R7303 Prediabetes: Secondary | ICD-10-CM

## 2022-08-31 DIAGNOSIS — I10 Essential (primary) hypertension: Secondary | ICD-10-CM

## 2022-08-31 DIAGNOSIS — E890 Postprocedural hypothyroidism: Secondary | ICD-10-CM

## 2022-08-31 DIAGNOSIS — K219 Gastro-esophageal reflux disease without esophagitis: Secondary | ICD-10-CM

## 2022-08-31 NOTE — Assessment & Plan Note (Signed)
Chronic BP well controlled Continue amlodipine 5 mg daily, losartan 50 mg cmp

## 2022-08-31 NOTE — Assessment & Plan Note (Signed)
Chronic Controlled, stable Continue duloxetine 30 mg daily

## 2022-08-31 NOTE — Assessment & Plan Note (Signed)
Chronic  Clinically euthyroid Check tsh and will titrate med dose if needed Currently taking levothyroxine 100 mcg daily

## 2022-08-31 NOTE — Assessment & Plan Note (Signed)
Chronic Check a1c Low sugar / carb diet Stressed regular exercise  

## 2022-08-31 NOTE — Assessment & Plan Note (Signed)
Chronic GERD controlled Continue omeprazole 20 mg daily  

## 2022-09-05 ENCOUNTER — Other Ambulatory Visit: Payer: Self-pay | Admitting: Internal Medicine

## 2022-09-10 ENCOUNTER — Encounter: Payer: Self-pay | Admitting: Internal Medicine

## 2022-09-10 NOTE — Progress Notes (Signed)
[name] [address] [place] [address] Phone: [phone] Subjective:   Bruce Donath, am serving as a scribe for Dr. Antoine Primas.  I'm seeing this patient by the request  of:  Pincus Sanes, MD  CC: Right shoulder exam, back pain and allover pain  [id number]  06/05/2022 Patient given injection and tolerated the procedure well, discussed icing regimen and home exercises, which activities to do and which ones to avoid, increase activity slowly otherwise. Follow-up again in 6 to 8 weeks. Patient will continue to work on core strengthening. I anticipate patient doing well overall.   Update 09/11/2022 Alyssa [name] is a 62 y.o. female coming in with complaint of R hip, lower back and R shoulder pain. pain. Patient states that 3 weeks ago she was putting a heavy box up overhead. Felt a pop. Pain radiating from anterior aspect to the forearm. Pain with flexion. Does have some tingling.   Pain throughout entire spine today. Pain in her hip starts in the glute and wraps around lateral aspect of hip and down the R leg.       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   Pre-diabetes    SVD (spontaneous vaginal delivery)    x 3   Past Surgical History:  Procedure Laterality Date   ABDOMINAL HYSTERECTOMY     APPENDECTOMY     with hysterectomy   ENDOMETRIAL ABLATION  2002   LAPAROSCOPIC CHOLECYSTECTOMY  12/09/2016   THYROID LOBECTOMY N/A 01/22/2022   Procedure: THYROID ISTHMUSECTOMY WITH RESECTION OF SUBSTERNAL MASS;  Surgeon: Darnell Level, MD;  Location: [facility];  Service: General;  Laterality: N/A;   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   Procedure: LEFT TOTAL KNEE ARTHROPLASTY;  Surgeon: Marcene Corning, MD;  Location: [facility];  Service: Orthopedics;  Laterality: Left;    TUBAL LIGATION  1991   WISDOM TOOTH EXTRACTION     Social History   Socioeconomic History   Marital status: Married    Spouse name: Not on file   Number of children: 3   Years of education: Not on file   Highest education level: Not on file  Occupational History   Not on file  Tobacco Use   Smoking status: Never   Smokeless tobacco: Never  Vaping Use   Vaping Use: Never used  Substance and Sexual Activity   Alcohol use: No   Drug use: No   Sexual activity: Yes    Birth control/protection: Post-menopausal    Comment: Hysterctomy  Other Topics Concern   Not on file  Social History Narrative   Not on file   Social Determinants of Health   Financial Resource Strain: Not on file  Food Insecurity: Not on file  Transportation Needs: Not on file  Physical Activity: Not on file  Stress: Not on file  Social Connections: Not on file   Allergies  Allergen Reactions   Codeine Itching   Doxycycline Other (See Comments)    chest pain   Penicillins Nausea And Vomiting   Prednisone Anxiety    severe anxiety   Family History  Problem Relation Age of Onset   Cancer Mother 56       vulvar cancer   Lung cancer Mother 18   Stroke Father  Cancer - Lung Sister    Suicidality Sister    Suicidality Brother    Arthritis Brother    Prostate cancer Maternal Uncle    Prostate cancer Maternal Uncle    Stroke Maternal Grandmother    Prostate cancer Maternal Grandfather    Cervical cancer Paternal Grandmother    Throat cancer Paternal Grandfather    Colon cancer Neg Hx    Rectal cancer Neg Hx    Stomach cancer Neg Hx     Current Outpatient Medications (Endocrine & Metabolic):    levothyroxine (SYNTHROID) 100 MCG tablet, Take 1 tablet (100 mcg total) by mouth daily.  Current Outpatient Medications (Cardiovascular):    amLODipine (NORVASC) 5 MG tablet, TAKE 1 TABLET BY MOUTH DAILY   losartan (COZAAR) 50 MG tablet, TAKE 1 TABLET BY MOUTH DAILY  Current Outpatient Medications  (Respiratory):    diphenhydrAMINE (BENADRYL) 25 MG tablet, Take 50 mg by mouth at bedtime as needed for sleep.  Current Outpatient Medications (Analgesics):    meloxicam (MOBIC) 15 MG tablet, TAKE 1 TABLET BY MOUTH ONCE DAILY AS NEEDED FOR PAIN   Current Outpatient Medications (Other):    DULoxetine (CYMBALTA) 30 MG capsule, TAKE 1 CAPSULE BY MOUTH DAILY   gabapentin (NEURONTIN) 100 MG capsule, Take 1 capsule (100 mg total) by mouth 2 (two) times daily as needed (nerve pain).   gabapentin (NEURONTIN) 300 MG capsule, Take 1 capsule (300 mg total) by mouth at bedtime.   omeprazole (PRILOSEC) 20 MG capsule, TAKE 1 CAPSULE BY MOUTH DAILY   tiZANidine (ZANAFLEX) 4 MG tablet, TAKE 1 TABLET BY MOUTH EVERY 6 HOURS AS NEEDED FOR MUSCLE SPASM   Reviewed prior external information including notes and imaging from  primary care provider As well as notes that were available from care everywhere and other healthcare systems.  Past medical history, social, surgical and family history all reviewed in electronic medical record.  No pertanent information unless stated regarding to the chief complaint.   Review of Systems:  No headache, visual changes, nausea, vomiting, diarrhea, constipation, dizziness, abdominal pain, skin rash, fevers, chills, night sweats, weight loss, swollen lymph nodes, body aches, joint swelling, chest pain, shortness of breath, mood changes. POSITIVE muscle aches  Objective  Height 5\' 1"  (1.549 m).   General: No apparent distress alert and oriented x3 mood and affect normal, dressed appropriately.  HEENT: Pupils equal, extraocular movements intact  Respiratory: Patient's speak in full sentences and does not appear short of breath  Cardiovascular: No lower extremity edema, non tender, no erythema  Shoulder: Right Inspection reveals no abnormalities, atrophy or asymmetry. Palpation is normal with no tenderness over AC joint or bicipital groove. ROM is full in all planes  passively. Rotator cuff strength normal throughout. signs of impingement with positive Neer and Hawkin's tests, but negative empty can sign. Speeds and Yergason's tests normal. No labral pathology noted with negative Obrien's, negative clunk and good stability. Normal scapular function observed. No painful arc and no drop arm sign. No apprehension sign  MSK US performed of: Right This study was ordered, performed, and interpreted by Terrilee Files D.O.  Shoulder:   Supraspinatus:  Appears normal on long and transverse views, Bursal bulge seen with shoulder abduction on impingement view. Infraspinatus:  Appears normal on long and transverse views. Significant increase in Doppler flow Subscapularis:  Appears normal on long and transverse views. Positive bursa Biceps Tendon:  Appears normal on long and transverse views, no fraying of tendon, tendon located in intertubercular groove, no subluxation  with shoulder internal or external rotation.  Impression: Subacromial bursitis  Procedure: Real-time Ultrasound Guided Injection of right glenohumeral joint Device: GE Logiq E  Ultrasound guided injection is preferred based studies that show increased duration, increased effect, greater accuracy, decreased procedural pain, increased response rate with ultrasound guided versus blind injection.  Verbal informed consent obtained.  Time-out conducted.  Noted no overlying erythema, induration, or other signs of local infection.  Skin prepped in a sterile fashion.  Local anesthesia: Topical Ethyl chloride.  With sterile technique and under real time ultrasound guidance:  Joint visualized.  23g 1  inch needle inserted posterior approach. Pictures taken for needle placement. Patient did have injection of 2 cc of 1% lidocaine, 2 cc of 0.5% Marcaine, and 1.0 cc of Kenalog 40 mg/dL. Completed without difficulty  Pain immediately resolved suggesting accurate placement of the medication.  Advised to call if  fevers/chills, erythema, induration, drainage, or persistent bleeding.  Impression: Technically successful ultrasound guided injection.    Impression and Recommendations:     The above documentation has been reviewed and is accurate and complete Judi Saa, DO

## 2022-09-10 NOTE — Patient Instructions (Addendum)
      Blood work was ordered.   The lab is on the first floor.    Medications changes include :   None   A CT scan of your chest for June   Return in about 6 months (around 03/14/2023) for Physical Exam.

## 2022-09-10 NOTE — Progress Notes (Signed)
Subjective:    Patient ID: Alyssa [name], female    DOB: 1961/01/22, 62 y.o.   MRN: [id number]     HPI Alyssa [name] is here for follow up of her chronic medical problems, including htn, prediabetes, hypothyroid, GERD, anxiety/depression  Has gained weight.  Feels she is addicted to sugar.  No exercise.    Medications and allergies reviewed with patient and updated if appropriate.  Current Outpatient Medications on File Prior to Visit  Medication Sig Dispense Refill   amLODipine (NORVASC) 5 MG tablet TAKE 1 TABLET BY MOUTH DAILY 90 tablet 3   diphenhydrAMINE (BENADRYL) 25 MG tablet Take 50 mg by mouth at bedtime as needed for sleep.     DULoxetine (CYMBALTA) 30 MG capsule TAKE 1 CAPSULE BY MOUTH DAILY 90 capsule 2   gabapentin (NEURONTIN) 100 MG capsule Take 1 capsule (100 mg total) by mouth 2 (two) times daily as needed (nerve pain). 90 capsule 3   gabapentin (NEURONTIN) 300 MG capsule Take 1 capsule (300 mg total) by mouth at bedtime. 90 capsule 2   levothyroxine (SYNTHROID) 100 MCG tablet Take 1 tablet (100 mcg total) by mouth daily. 90 tablet 3   losartan (COZAAR) 50 MG tablet TAKE 1 TABLET BY MOUTH DAILY 90 tablet 2   meloxicam (MOBIC) 15 MG tablet TAKE 1 TABLET BY MOUTH ONCE DAILY AS NEEDED FOR PAIN 30 tablet 0   omeprazole (PRILOSEC) 20 MG capsule TAKE 1 CAPSULE BY MOUTH DAILY 90 capsule 3   tiZANidine (ZANAFLEX) 4 MG tablet TAKE 1 TABLET BY MOUTH EVERY 6 HOURS AS NEEDED FOR MUSCLE SPASM 40 tablet 0   No current facility-administered medications on file prior to visit.     Review of Systems  Constitutional:  Negative for fever.  Respiratory:  Negative for cough, shortness of breath and wheezing.   Cardiovascular:  Negative for chest pain, palpitations and leg swelling.  Neurological:  Negative for light-headedness and headaches.       Objective:   Vitals:   09/11/22 0852  BP: 130/80  Pulse: 74  Temp: 98 F (36.7 C)  SpO2: 98%   BP Readings from Last 3  Encounters:  09/11/22 130/80  06/05/22 132/84  03/06/22 120/82   Wt Readings from Last 3 Encounters:  09/11/22 200 lb (90.7 kg)  06/05/22 200 lb (90.7 kg)  03/06/22 195 lb (88.5 kg)   Body mass index is 37.79 kg/m.    Physical Exam Constitutional:      General: She is not in acute distress.    Appearance: Normal appearance.  HENT:     Head: Normocephalic and atraumatic.  Eyes:     Conjunctiva/sclera: Conjunctivae normal.  Cardiovascular:     Rate and Rhythm: Normal rate and regular rhythm.     Heart sounds: Normal heart sounds.  Pulmonary:     Effort: Pulmonary effort is normal. No respiratory distress.     Breath sounds: Normal breath sounds. No wheezing.  Musculoskeletal:     Cervical back: Neck supple.     Right lower leg: No edema.     Left lower leg: No edema.  Lymphadenopathy:     Cervical: No cervical adenopathy.  Skin:    General: Skin is warm and dry.     Findings: No rash.  Neurological:     Mental Status: She is alert. Mental status is at baseline.  Psychiatric:        Mood and Affect: Mood normal.  Behavior: Behavior normal.        Lab Results  Component Value Date   WBC 3.6 (L) 03/06/2022   HGB 14.4 03/06/2022   HCT 42.6 03/06/2022   PLT 243.0 03/06/2022   GLUCOSE 101 (H) 03/06/2022   CHOL 203 (H) 03/06/2022   TRIG 64.0 03/06/2022   HDL 70.60 03/06/2022   LDLCALC 120 (H) 03/06/2022   ALT 21 03/06/2022   AST 26 03/06/2022   NA 141 03/06/2022   K 4.1 03/06/2022   CL 102 03/06/2022   CREATININE 1.04 03/06/2022   BUN 19 03/06/2022   CO2 29 03/06/2022   TSH 4.10 06/05/2022   INR 1.0 06/08/2021   HGBA1C 6.2 03/06/2022     Assessment & Plan:    See Problem List for Assessment and Plan of chronic medical problems.

## 2022-09-11 ENCOUNTER — Ambulatory Visit (INDEPENDENT_AMBULATORY_CARE_PROVIDER_SITE_OTHER): Payer: BC Managed Care – PPO | Admitting: Family Medicine

## 2022-09-11 ENCOUNTER — Ambulatory Visit (INDEPENDENT_AMBULATORY_CARE_PROVIDER_SITE_OTHER): Payer: BC Managed Care – PPO | Admitting: Internal Medicine

## 2022-09-11 ENCOUNTER — Encounter: Payer: Self-pay | Admitting: Family Medicine

## 2022-09-11 ENCOUNTER — Ambulatory Visit: Payer: Self-pay

## 2022-09-11 VITALS — Ht 61.0 in

## 2022-09-11 VITALS — BP 130/80 | HR 74 | Temp 98.0°F | Ht 61.0 in | Wt 200.0 lb

## 2022-09-11 DIAGNOSIS — R7303 Prediabetes: Secondary | ICD-10-CM | POA: Diagnosis not present

## 2022-09-11 DIAGNOSIS — K219 Gastro-esophageal reflux disease without esophagitis: Secondary | ICD-10-CM | POA: Diagnosis not present

## 2022-09-11 DIAGNOSIS — E890 Postprocedural hypothyroidism: Secondary | ICD-10-CM | POA: Diagnosis not present

## 2022-09-11 DIAGNOSIS — I10 Essential (primary) hypertension: Secondary | ICD-10-CM

## 2022-09-11 DIAGNOSIS — F419 Anxiety disorder, unspecified: Secondary | ICD-10-CM

## 2022-09-11 DIAGNOSIS — R918 Other nonspecific abnormal finding of lung field: Secondary | ICD-10-CM

## 2022-09-11 DIAGNOSIS — Z8543 Personal history of malignant neoplasm of ovary: Secondary | ICD-10-CM

## 2022-09-11 DIAGNOSIS — M7551 Bursitis of right shoulder: Secondary | ICD-10-CM | POA: Insufficient documentation

## 2022-09-11 DIAGNOSIS — R911 Solitary pulmonary nodule: Secondary | ICD-10-CM

## 2022-09-11 DIAGNOSIS — M25551 Pain in right hip: Secondary | ICD-10-CM

## 2022-09-11 DIAGNOSIS — E559 Vitamin D deficiency, unspecified: Secondary | ICD-10-CM

## 2022-09-11 DIAGNOSIS — F32A Depression, unspecified: Secondary | ICD-10-CM

## 2022-09-11 DIAGNOSIS — M7061 Trochanteric bursitis, right hip: Secondary | ICD-10-CM

## 2022-09-11 DIAGNOSIS — E669 Obesity, unspecified: Secondary | ICD-10-CM

## 2022-09-11 LAB — VITAMIN D 25 HYDROXY (VIT D DEFICIENCY, FRACTURES): VITD: 20.73 ng/mL — ABNORMAL LOW (ref 30.00–100.00)

## 2022-09-11 LAB — COMPREHENSIVE METABOLIC PANEL
ALT: 22 U/L (ref 0–35)
AST: 28 U/L (ref 0–37)
Albumin: 4.4 g/dL (ref 3.5–5.2)
Alkaline Phosphatase: 92 U/L (ref 39–117)
BUN: 20 mg/dL (ref 6–23)
CO2: 29 mEq/L (ref 19–32)
Calcium: 10.2 mg/dL (ref 8.4–10.5)
Chloride: 100 mEq/L (ref 96–112)
Creatinine, Ser: 1.06 mg/dL (ref 0.40–1.20)
GFR: 57.31 mL/min — ABNORMAL LOW (ref >60.00)
Glucose, Bld: 81 mg/dL (ref 70–99)
Potassium: 4.7 mEq/L (ref 3.5–5.1)
Sodium: 137 mEq/L (ref 135–145)
Total Bilirubin: 0.5 mg/dL (ref 0.2–1.2)
Total Protein: 7.6 g/dL (ref 6.0–8.3)

## 2022-09-11 LAB — HEMOGLOBIN A1C: Hgb A1c MFr Bld: 6 % (ref 4.6–6.5)

## 2022-09-11 LAB — TSH: TSH: 5.11 u[IU]/mL (ref 0.35–5.50)

## 2022-09-11 MED ORDER — METHYLPREDNISOLONE ACETATE 40 MG/ML IJ SUSP
40.0000 mg | Freq: Once | INTRAMUSCULAR | Status: AC
  Administered 2022-09-11: 40 mg via INTRAMUSCULAR

## 2022-09-11 MED ORDER — KETOROLAC TROMETHAMINE 30 MG/ML IJ SOLN
30.0000 mg | Freq: Once | INTRAMUSCULAR | Status: AC
  Administered 2022-09-11: 30 mg via INTRAMUSCULAR

## 2022-09-11 NOTE — Assessment & Plan Note (Signed)
Chronic She has gained weight.  She is not exercising.  Has been eating badly-too much sugar which she feels she is addicted to Knows she needs to be exercising and change her eating habits and plans on doing that

## 2022-09-11 NOTE — Assessment & Plan Note (Signed)
Chronic Check a1c Low sugar / carb diet Stressed regular exercise  

## 2022-09-11 NOTE — Assessment & Plan Note (Addendum)
Chronic GERD not ideally controlled Continue omeprazole 20 mg daily Work on weight loss and diet

## 2022-09-11 NOTE — Assessment & Plan Note (Signed)
Chronic  Clinically euthyroid Check tsh and will titrate med dose if needed Currently taking levothyroxine 100 mcg daily   

## 2022-09-11 NOTE — Assessment & Plan Note (Signed)
Chronic Blood pressure well controlled CMP Continue amlodipine 5 mg daily, losartan 50 mg daily 

## 2022-09-11 NOTE — Assessment & Plan Note (Signed)
Patient given injection and tolerated the procedure well, discussed icing regimen and home exercises, discussed which activities to do and which ones to avoid.  Differential includes cervical radiculopathy, we did discuss x-rays with patient's history of different cancers, patient wants to avoid that at the moment.  Discussed icing regimen and home exercises otherwise and follow-up with me again in 6 to 8 weeks.  Had improvement in range of motion almost immediately.

## 2022-09-11 NOTE — Assessment & Plan Note (Signed)
Chronic Lung nodules evident in 2022 Stable 10/2021, stable 06/2022 Will check CT scan 1 more time given her history of ovarian cancer-CT scan ordered for 12/2022 If these nodules are stable at this time no further evaluation will be necessary

## 2022-09-11 NOTE — Assessment & Plan Note (Signed)
Chronic Check vitamin D level 

## 2022-09-11 NOTE — Patient Instructions (Signed)
Injected AC joint today

## 2022-09-11 NOTE — Assessment & Plan Note (Addendum)
Chronic Controlled, Stable Continue duloxetine 30 mg daily - can try taking it QOD and see how she feels and consider stopping it

## 2022-09-11 NOTE — Assessment & Plan Note (Signed)
Some worsening discomfort again.  Could consider injection but at the moment given Toradol and Depo-Medrol secondary to this as well as more of the thoracic pain.  Discussed with patient about continuing the home exercises and icing regimen, increase activity slowly.  Follow-up again in 6 to 8 weeks

## 2022-09-12 MED ORDER — LEVOTHYROXINE SODIUM 112 MCG PO TABS: 112.0000 ug | ORAL_TABLET | Freq: Every day | ORAL | 3 refills | Status: AC

## 2022-09-12 MED ORDER — VITAMIN D (ERGOCALCIFEROL) 1.25 MG (50000 UNIT) PO CAPS: 50000.0000 [IU] | ORAL_CAPSULE | ORAL | 0 refills | Status: AC

## 2022-09-12 NOTE — Addendum Note (Signed)
Addended by: Binnie Rail on: 09/12/2022 05:34 AM   Modules accepted: Orders

## 2022-09-30 ENCOUNTER — Other Ambulatory Visit: Payer: Self-pay | Admitting: Internal Medicine

## 2022-10-04 ENCOUNTER — Other Ambulatory Visit: Payer: Self-pay | Admitting: Internal Medicine

## 2022-10-05 ENCOUNTER — Other Ambulatory Visit: Payer: Self-pay

## 2022-10-19 DIAGNOSIS — S6991XA Unspecified injury of right wrist, hand and finger(s), initial encounter: Secondary | ICD-10-CM | POA: Diagnosis not present

## 2022-10-19 DIAGNOSIS — S52514A Nondisplaced fracture of right radial styloid process, initial encounter for closed fracture: Secondary | ICD-10-CM | POA: Diagnosis not present

## 2022-10-19 DIAGNOSIS — R600 Localized edema: Secondary | ICD-10-CM | POA: Diagnosis not present

## 2022-10-19 DIAGNOSIS — M25531 Pain in right wrist: Secondary | ICD-10-CM | POA: Diagnosis not present

## 2022-10-19 DIAGNOSIS — W19XXXA Unspecified fall, initial encounter: Secondary | ICD-10-CM | POA: Diagnosis not present

## 2022-10-19 DIAGNOSIS — S52614A Nondisplaced fracture of right ulna styloid process, initial encounter for closed fracture: Secondary | ICD-10-CM | POA: Diagnosis not present

## 2022-10-22 DIAGNOSIS — M25511 Pain in right shoulder: Secondary | ICD-10-CM | POA: Diagnosis not present

## 2022-10-25 ENCOUNTER — Other Ambulatory Visit: Payer: Self-pay | Admitting: Internal Medicine

## 2022-10-30 DIAGNOSIS — M25511 Pain in right shoulder: Secondary | ICD-10-CM | POA: Diagnosis not present

## 2022-11-01 ENCOUNTER — Encounter: Payer: Self-pay | Admitting: Internal Medicine

## 2022-11-05 DIAGNOSIS — M25511 Pain in right shoulder: Secondary | ICD-10-CM | POA: Diagnosis not present

## 2022-11-05 DIAGNOSIS — S62101A Fracture of unspecified carpal bone, right wrist, initial encounter for closed fracture: Secondary | ICD-10-CM | POA: Diagnosis not present

## 2022-11-13 ENCOUNTER — Ambulatory Visit: Payer: BC Managed Care – PPO | Admitting: Family Medicine

## 2022-11-15 ENCOUNTER — Other Ambulatory Visit: Payer: Self-pay | Admitting: Internal Medicine

## 2022-11-23 ENCOUNTER — Other Ambulatory Visit: Payer: Self-pay | Admitting: Internal Medicine

## 2022-11-29 DIAGNOSIS — M75121 Complete rotator cuff tear or rupture of right shoulder, not specified as traumatic: Secondary | ICD-10-CM | POA: Diagnosis not present

## 2022-11-29 DIAGNOSIS — M7541 Impingement syndrome of right shoulder: Secondary | ICD-10-CM | POA: Diagnosis not present

## 2022-11-29 DIAGNOSIS — M25811 Other specified joint disorders, right shoulder: Secondary | ICD-10-CM | POA: Diagnosis not present

## 2022-12-18 ENCOUNTER — Other Ambulatory Visit: Payer: Self-pay | Admitting: Internal Medicine

## 2022-12-19 ENCOUNTER — Ambulatory Visit (HOSPITAL_BASED_OUTPATIENT_CLINIC_OR_DEPARTMENT_OTHER): Admission: RE | Admit: 2022-12-19 | Payer: BC Managed Care – PPO | Source: Ambulatory Visit

## 2022-12-21 DIAGNOSIS — M6281 Muscle weakness (generalized): Secondary | ICD-10-CM | POA: Diagnosis not present

## 2022-12-21 DIAGNOSIS — M25511 Pain in right shoulder: Secondary | ICD-10-CM | POA: Diagnosis not present

## 2022-12-21 DIAGNOSIS — M75101 Unspecified rotator cuff tear or rupture of right shoulder, not specified as traumatic: Secondary | ICD-10-CM | POA: Diagnosis not present

## 2022-12-21 DIAGNOSIS — M25611 Stiffness of right shoulder, not elsewhere classified: Secondary | ICD-10-CM | POA: Diagnosis not present

## 2022-12-23 ENCOUNTER — Other Ambulatory Visit: Payer: Self-pay | Admitting: Internal Medicine

## 2022-12-25 DIAGNOSIS — M75101 Unspecified rotator cuff tear or rupture of right shoulder, not specified as traumatic: Secondary | ICD-10-CM | POA: Diagnosis not present

## 2022-12-25 DIAGNOSIS — M25611 Stiffness of right shoulder, not elsewhere classified: Secondary | ICD-10-CM | POA: Diagnosis not present

## 2022-12-25 DIAGNOSIS — M6281 Muscle weakness (generalized): Secondary | ICD-10-CM | POA: Diagnosis not present

## 2022-12-25 DIAGNOSIS — M25511 Pain in right shoulder: Secondary | ICD-10-CM | POA: Diagnosis not present

## 2022-12-26 DIAGNOSIS — M25511 Pain in right shoulder: Secondary | ICD-10-CM | POA: Diagnosis not present

## 2022-12-26 DIAGNOSIS — M25611 Stiffness of right shoulder, not elsewhere classified: Secondary | ICD-10-CM | POA: Diagnosis not present

## 2022-12-26 DIAGNOSIS — M75101 Unspecified rotator cuff tear or rupture of right shoulder, not specified as traumatic: Secondary | ICD-10-CM | POA: Diagnosis not present

## 2022-12-26 DIAGNOSIS — M6281 Muscle weakness (generalized): Secondary | ICD-10-CM | POA: Diagnosis not present

## 2022-12-31 DIAGNOSIS — M25611 Stiffness of right shoulder, not elsewhere classified: Secondary | ICD-10-CM | POA: Diagnosis not present

## 2022-12-31 DIAGNOSIS — M6281 Muscle weakness (generalized): Secondary | ICD-10-CM | POA: Diagnosis not present

## 2022-12-31 DIAGNOSIS — M25511 Pain in right shoulder: Secondary | ICD-10-CM | POA: Diagnosis not present

## 2022-12-31 DIAGNOSIS — M75101 Unspecified rotator cuff tear or rupture of right shoulder, not specified as traumatic: Secondary | ICD-10-CM | POA: Diagnosis not present

## 2023-01-01 DIAGNOSIS — M6281 Muscle weakness (generalized): Secondary | ICD-10-CM | POA: Diagnosis not present

## 2023-01-01 DIAGNOSIS — M75101 Unspecified rotator cuff tear or rupture of right shoulder, not specified as traumatic: Secondary | ICD-10-CM | POA: Diagnosis not present

## 2023-01-01 DIAGNOSIS — M25611 Stiffness of right shoulder, not elsewhere classified: Secondary | ICD-10-CM | POA: Diagnosis not present

## 2023-01-01 DIAGNOSIS — M25511 Pain in right shoulder: Secondary | ICD-10-CM | POA: Diagnosis not present

## 2023-01-02 DIAGNOSIS — M75101 Unspecified rotator cuff tear or rupture of right shoulder, not specified as traumatic: Secondary | ICD-10-CM | POA: Diagnosis not present

## 2023-01-02 DIAGNOSIS — M6281 Muscle weakness (generalized): Secondary | ICD-10-CM | POA: Diagnosis not present

## 2023-01-02 DIAGNOSIS — M25511 Pain in right shoulder: Secondary | ICD-10-CM | POA: Diagnosis not present

## 2023-01-02 DIAGNOSIS — M25611 Stiffness of right shoulder, not elsewhere classified: Secondary | ICD-10-CM | POA: Diagnosis not present

## 2023-01-16 DIAGNOSIS — M75101 Unspecified rotator cuff tear or rupture of right shoulder, not specified as traumatic: Secondary | ICD-10-CM | POA: Diagnosis not present

## 2023-01-16 DIAGNOSIS — M25611 Stiffness of right shoulder, not elsewhere classified: Secondary | ICD-10-CM | POA: Diagnosis not present

## 2023-01-16 DIAGNOSIS — M6281 Muscle weakness (generalized): Secondary | ICD-10-CM | POA: Diagnosis not present

## 2023-01-16 DIAGNOSIS — M25511 Pain in right shoulder: Secondary | ICD-10-CM | POA: Diagnosis not present

## 2023-01-17 DIAGNOSIS — M6281 Muscle weakness (generalized): Secondary | ICD-10-CM | POA: Diagnosis not present

## 2023-01-17 DIAGNOSIS — M25511 Pain in right shoulder: Secondary | ICD-10-CM | POA: Diagnosis not present

## 2023-01-17 DIAGNOSIS — M25611 Stiffness of right shoulder, not elsewhere classified: Secondary | ICD-10-CM | POA: Diagnosis not present

## 2023-01-17 DIAGNOSIS — M75101 Unspecified rotator cuff tear or rupture of right shoulder, not specified as traumatic: Secondary | ICD-10-CM | POA: Diagnosis not present

## 2023-01-20 ENCOUNTER — Other Ambulatory Visit: Payer: Self-pay | Admitting: Internal Medicine

## 2023-01-29 ENCOUNTER — Telehealth: Payer: Self-pay | Admitting: Internal Medicine

## 2023-01-29 NOTE — Telephone Encounter (Signed)
Patient had CT Scan without contrast scheduled in June at [facility].  Patient was unable to go and another order needs to be put in today so she can go this week.  Please let patient know  [phone]

## 2023-01-29 NOTE — Telephone Encounter (Signed)
It looks like this is scheduled for 8/2-do we need to reorder it?

## 2023-01-30 NOTE — Telephone Encounter (Signed)
Scan was scheduled but auth expired.  Alyssa [name] notified and will see if she can get her auth updated.  No updated order needed.

## 2023-01-31 DIAGNOSIS — M75101 Unspecified rotator cuff tear or rupture of right shoulder, not specified as traumatic: Secondary | ICD-10-CM | POA: Diagnosis not present

## 2023-01-31 DIAGNOSIS — M6281 Muscle weakness (generalized): Secondary | ICD-10-CM | POA: Diagnosis not present

## 2023-01-31 DIAGNOSIS — M25611 Stiffness of right shoulder, not elsewhere classified: Secondary | ICD-10-CM | POA: Diagnosis not present

## 2023-01-31 DIAGNOSIS — M25511 Pain in right shoulder: Secondary | ICD-10-CM | POA: Diagnosis not present

## 2023-01-31 NOTE — Patient Instructions (Addendum)
      Blood work was ordered.   The lab is on the first floor.    Medications changes include :       A referral was ordered and someone will call you to schedule an appointment.     Return in about 6 months (around 08/04/2023) for Physical Exam.

## 2023-01-31 NOTE — Progress Notes (Signed)
Subjective:    Patient ID: Alyssa [name], female    DOB: 1960/10/19, 62 y.o.   MRN: [id number]     HPI Alyssa [name] is here for follow up of her chronic medical problems.  Had Ct chest scheduled for this am to f/u pulm nodules - they were small but h/o ovarian ca.  She did not go to her visit.    Medications and allergies reviewed with patient and updated if appropriate.  Current Outpatient Medications on File Prior to Visit  Medication Sig Dispense Refill  . amLODipine (NORVASC) 5 MG tablet TAKE 1 TABLET BY MOUTH DAILY 90 tablet 3  . diphenhydrAMINE (BENADRYL) 25 MG tablet Take 50 mg by mouth at bedtime as needed for sleep.    Marland Kitchen gabapentin (NEURONTIN) 100 MG capsule Take 1 capsule (100 mg total) by mouth 2 (two) times daily as needed (nerve pain). 90 capsule 3  . levothyroxine (SYNTHROID) 112 MCG tablet Take 1 tablet (112 mcg total) by mouth daily. 90 tablet 3  . meloxicam (MOBIC) 15 MG tablet Take 1 tablet (15 mg total) by mouth daily. Annual appt due in Sept must see provider for future refills 30 tablet 2  . omeprazole (PRILOSEC) 20 MG capsule TAKE 1 CAPSULE BY MOUTH DAILY 90 capsule 3  . tiZANidine (ZANAFLEX) 4 MG tablet TAKE 1 TABLET BY MOUTH EVERY 6 HOURS AS NEEDED FOR MUSCLE SPASM 40 tablet 0   No current facility-administered medications on file prior to visit.     Review of Systems     Objective:  There were no vitals filed for this visit. BP Readings from Last 3 Encounters:  09/11/22 130/80  06/05/22 132/84  03/06/22 120/82   Wt Readings from Last 3 Encounters:  09/11/22 200 lb (90.7 kg)  06/05/22 200 lb (90.7 kg)  03/06/22 195 lb (88.5 kg)   There is no height or weight on file to calculate BMI.    Physical Exam     Lab Results  Component Value Date   WBC 3.6 (L) 03/06/2022   HGB 14.4 03/06/2022   HCT 42.6 03/06/2022   PLT 243.0 03/06/2022   GLUCOSE 81 09/11/2022   CHOL 203 (H) 03/06/2022   TRIG 64.0 03/06/2022   HDL 70.60 03/06/2022    LDLCALC 120 (H) 03/06/2022   ALT 22 09/11/2022   AST 28 09/11/2022   NA 137 09/11/2022   K 4.7 09/11/2022   CL 100 09/11/2022   CREATININE 1.06 09/11/2022   BUN 20 09/11/2022   CO2 29 09/11/2022   TSH 5.11 09/11/2022   INR 1.0 06/08/2021   HGBA1C 6.0 09/11/2022     Assessment & Plan:    See Problem List for Assessment and Plan of chronic medical problems.    This encounter was created in error - please disregard.

## 2023-02-01 ENCOUNTER — Encounter: Payer: BC Managed Care – PPO | Admitting: Internal Medicine

## 2023-02-01 ENCOUNTER — Ambulatory Visit (HOSPITAL_BASED_OUTPATIENT_CLINIC_OR_DEPARTMENT_OTHER): Admission: RE | Admit: 2023-02-01 | Payer: BC Managed Care – PPO | Source: Ambulatory Visit

## 2023-02-01 DIAGNOSIS — M6281 Muscle weakness (generalized): Secondary | ICD-10-CM | POA: Diagnosis not present

## 2023-02-01 DIAGNOSIS — M75101 Unspecified rotator cuff tear or rupture of right shoulder, not specified as traumatic: Secondary | ICD-10-CM | POA: Diagnosis not present

## 2023-02-01 DIAGNOSIS — R918 Other nonspecific abnormal finding of lung field: Secondary | ICD-10-CM

## 2023-02-01 DIAGNOSIS — M25511 Pain in right shoulder: Secondary | ICD-10-CM | POA: Diagnosis not present

## 2023-02-01 DIAGNOSIS — R7303 Prediabetes: Secondary | ICD-10-CM

## 2023-02-01 DIAGNOSIS — I10 Essential (primary) hypertension: Secondary | ICD-10-CM

## 2023-02-01 DIAGNOSIS — M25611 Stiffness of right shoulder, not elsewhere classified: Secondary | ICD-10-CM | POA: Diagnosis not present

## 2023-02-01 DIAGNOSIS — E890 Postprocedural hypothyroidism: Secondary | ICD-10-CM

## 2023-02-07 ENCOUNTER — Ambulatory Visit: Payer: BC Managed Care – PPO | Admitting: Internal Medicine

## 2023-02-14 ENCOUNTER — Other Ambulatory Visit: Payer: Self-pay | Admitting: Family Medicine

## 2023-02-16 ENCOUNTER — Encounter: Payer: Self-pay | Admitting: Internal Medicine

## 2023-02-16 NOTE — Progress Notes (Deleted)
      Subjective:    Patient ID: Alyssa [name], female    DOB: 02-10-1961, 62 y.o.   MRN: [id number]     HPI Alyssa [name] is here for follow up of her chronic medical problems.  Had Ct chest scheduled for this am to f/u pulm nodules - they were small but h/o ovarian ca.  She did not go to her visit.  Follow up thyroid -   Medications and allergies reviewed with patient and updated if appropriate.  Current Outpatient Medications on File Prior to Visit  Medication Sig Dispense Refill   amLODipine (NORVASC) 5 MG tablet TAKE 1 TABLET BY MOUTH DAILY 90 tablet 3   diphenhydrAMINE (BENADRYL) 25 MG tablet Take 50 mg by mouth at bedtime as needed for sleep.     DULoxetine (CYMBALTA) 30 MG capsule TAKE 1 CAPSULE BY MOUTH DAILY 90 capsule 2   gabapentin (NEURONTIN) 100 MG capsule Take 1 capsule (100 mg total) by mouth 2 (two) times daily as needed (nerve pain). 90 capsule 3   gabapentin (NEURONTIN) 300 MG capsule Take 1 capsule by mouth at bedtime 90 capsule 0   levothyroxine (SYNTHROID) 112 MCG tablet Take 1 tablet (112 mcg total) by mouth daily. 90 tablet 3   losartan (COZAAR) 50 MG tablet TAKE 1 TABLET BY MOUTH DAILY 90 tablet 2   meloxicam (MOBIC) 15 MG tablet Take 1 tablet (15 mg total) by mouth daily. Annual appt due in Sept must see provider for future refills 30 tablet 2   omeprazole (PRILOSEC) 20 MG capsule TAKE 1 CAPSULE BY MOUTH DAILY 90 capsule 3   tiZANidine (ZANAFLEX) 4 MG tablet TAKE 1 TABLET BY MOUTH EVERY 6 HOURS AS NEEDED FOR MUSCLE SPASM 40 tablet 0   No current facility-administered medications on file prior to visit.     Review of Systems     Objective:  There were no vitals filed for this visit. BP Readings from Last 3 Encounters:  09/11/22 130/80  06/05/22 132/84  03/06/22 120/82   Wt Readings from Last 3 Encounters:  09/11/22 200 lb (90.7 kg)  06/05/22 200 lb (90.7 kg)  03/06/22 195 lb (88.5 kg)   There is no height or weight on file to calculate BMI.     Physical Exam     Lab Results  Component Value Date   WBC 3.6 (L) 03/06/2022   HGB 14.4 03/06/2022   HCT 42.6 03/06/2022   PLT 243.0 03/06/2022   GLUCOSE 81 09/11/2022   CHOL 203 (H) 03/06/2022   TRIG 64.0 03/06/2022   HDL 70.60 03/06/2022   LDLCALC 120 (H) 03/06/2022   ALT 22 09/11/2022   AST 28 09/11/2022   NA 137 09/11/2022   K 4.7 09/11/2022   CL 100 09/11/2022   CREATININE 1.06 09/11/2022   BUN 20 09/11/2022   CO2 29 09/11/2022   TSH 5.11 09/11/2022   INR 1.0 06/08/2021   HGBA1C 6.0 09/11/2022     Assessment & Plan:    See Problem List for Assessment and Plan of chronic medical problems.

## 2023-02-18 ENCOUNTER — Ambulatory Visit: Payer: BC Managed Care – PPO | Admitting: Internal Medicine

## 2023-02-18 DIAGNOSIS — E890 Postprocedural hypothyroidism: Secondary | ICD-10-CM

## 2023-02-18 DIAGNOSIS — I10 Essential (primary) hypertension: Secondary | ICD-10-CM

## 2023-02-18 DIAGNOSIS — R7303 Prediabetes: Secondary | ICD-10-CM

## 2023-02-18 DIAGNOSIS — K219 Gastro-esophageal reflux disease without esophagitis: Secondary | ICD-10-CM

## 2023-02-26 ENCOUNTER — Ambulatory Visit: Payer: BC Managed Care – PPO | Admitting: Internal Medicine

## 2023-03-06 ENCOUNTER — Encounter: Payer: Self-pay | Admitting: Internal Medicine

## 2023-03-06 NOTE — Progress Notes (Deleted)
      Subjective:    Patient ID: Alyssa [name], female    DOB: 02-10-1961, 62 y.o.   MRN: [id number]     HPI Alyssa [name] is here for follow up of her chronic medical problems.  Had Ct chest scheduled for this am to f/u pulm nodules - they were small but h/o ovarian ca.  She did not go to her visit.  Follow up thyroid -   Medications and allergies reviewed with patient and updated if appropriate.  Current Outpatient Medications on File Prior to Visit  Medication Sig Dispense Refill   amLODipine (NORVASC) 5 MG tablet TAKE 1 TABLET BY MOUTH DAILY 90 tablet 3   diphenhydrAMINE (BENADRYL) 25 MG tablet Take 50 mg by mouth at bedtime as needed for sleep.     DULoxetine (CYMBALTA) 30 MG capsule TAKE 1 CAPSULE BY MOUTH DAILY 90 capsule 2   gabapentin (NEURONTIN) 100 MG capsule Take 1 capsule (100 mg total) by mouth 2 (two) times daily as needed (nerve pain). 90 capsule 3   gabapentin (NEURONTIN) 300 MG capsule Take 1 capsule by mouth at bedtime 90 capsule 0   levothyroxine (SYNTHROID) 112 MCG tablet Take 1 tablet (112 mcg total) by mouth daily. 90 tablet 3   losartan (COZAAR) 50 MG tablet TAKE 1 TABLET BY MOUTH DAILY 90 tablet 2   meloxicam (MOBIC) 15 MG tablet Take 1 tablet (15 mg total) by mouth daily. Annual appt due in Sept must see provider for future refills 30 tablet 2   omeprazole (PRILOSEC) 20 MG capsule TAKE 1 CAPSULE BY MOUTH DAILY 90 capsule 3   tiZANidine (ZANAFLEX) 4 MG tablet TAKE 1 TABLET BY MOUTH EVERY 6 HOURS AS NEEDED FOR MUSCLE SPASM 40 tablet 0   No current facility-administered medications on file prior to visit.     Review of Systems     Objective:  There were no vitals filed for this visit. BP Readings from Last 3 Encounters:  09/11/22 130/80  06/05/22 132/84  03/06/22 120/82   Wt Readings from Last 3 Encounters:  09/11/22 200 lb (90.7 kg)  06/05/22 200 lb (90.7 kg)  03/06/22 195 lb (88.5 kg)   There is no height or weight on file to calculate BMI.     Physical Exam     Lab Results  Component Value Date   WBC 3.6 (L) 03/06/2022   HGB 14.4 03/06/2022   HCT 42.6 03/06/2022   PLT 243.0 03/06/2022   GLUCOSE 81 09/11/2022   CHOL 203 (H) 03/06/2022   TRIG 64.0 03/06/2022   HDL 70.60 03/06/2022   LDLCALC 120 (H) 03/06/2022   ALT 22 09/11/2022   AST 28 09/11/2022   NA 137 09/11/2022   K 4.7 09/11/2022   CL 100 09/11/2022   CREATININE 1.06 09/11/2022   BUN 20 09/11/2022   CO2 29 09/11/2022   TSH 5.11 09/11/2022   INR 1.0 06/08/2021   HGBA1C 6.0 09/11/2022     Assessment & Plan:    See Problem List for Assessment and Plan of chronic medical problems.

## 2023-03-07 ENCOUNTER — Ambulatory Visit: Payer: BC Managed Care – PPO | Admitting: Internal Medicine

## 2023-03-07 ENCOUNTER — Encounter: Payer: Self-pay | Admitting: Internal Medicine

## 2023-03-07 DIAGNOSIS — E890 Postprocedural hypothyroidism: Secondary | ICD-10-CM

## 2023-03-07 DIAGNOSIS — R7303 Prediabetes: Secondary | ICD-10-CM

## 2023-03-07 DIAGNOSIS — I10 Essential (primary) hypertension: Secondary | ICD-10-CM

## 2023-03-07 DIAGNOSIS — R918 Other nonspecific abnormal finding of lung field: Secondary | ICD-10-CM

## 2023-03-07 DIAGNOSIS — K219 Gastro-esophageal reflux disease without esophagitis: Secondary | ICD-10-CM

## 2023-03-07 DIAGNOSIS — F419 Anxiety disorder, unspecified: Secondary | ICD-10-CM

## 2023-03-07 DIAGNOSIS — E559 Vitamin D deficiency, unspecified: Secondary | ICD-10-CM

## 2023-03-07 NOTE — Assessment & Plan Note (Deleted)
Chronic Blood pressure well controlled CMP Continue amlodipine 5 mg daily, losartan 50 mg daily 

## 2023-03-07 NOTE — Assessment & Plan Note (Deleted)
Chronic Check vitamin D level 

## 2023-03-07 NOTE — Assessment & Plan Note (Deleted)
Chronic Check a1c Low sugar / carb diet Stressed regular exercise  Lab Results  Component Value Date   HGBA1C 6.0 09/11/2022

## 2023-03-07 NOTE — Assessment & Plan Note (Deleted)
Chronic Lung nodules evident in 2022 Stable 10/2021, stable 06/2022 We decided to check CT scan 1 more time given her history of ovarian cancer-CT scan ordered for 12/2022-she did not have the CT scan Has been stable so okay to stop monitoring

## 2023-03-07 NOTE — Assessment & Plan Note (Deleted)
Chronic  Clinically euthyroid Check tsh and will titrate med dose if needed Currently taking levothyroxine 112 mcg daily  

## 2023-03-07 NOTE — Assessment & Plan Note (Deleted)
Chronic Controlled, Stable Continue duloxetine 30 mg daily 

## 2023-03-07 NOTE — Assessment & Plan Note (Deleted)
Chronic GERD controlled Continue omeprazole 20 mg daily Work on weight loss and diet

## 2023-03-08 DIAGNOSIS — M25511 Pain in right shoulder: Secondary | ICD-10-CM | POA: Diagnosis not present

## 2023-03-13 ENCOUNTER — Encounter: Payer: Self-pay | Admitting: Internal Medicine

## 2023-03-13 NOTE — Progress Notes (Signed)
Subjective:    Patient ID: Alyssa [name], female    DOB: 09-15-1960, 62 y.o.   MRN: [id number]     HPI Alyssa [name] is here for follow up of her chronic medical problems.  Had Ct chest scheduled for this am to f/u pulm nodules - they were small but h/o ovarian ca.  She did not go to her visit.  Follow up thyroid -   Medications and allergies reviewed with patient and updated if appropriate.  Current Outpatient Medications on File Prior to Visit  Medication Sig Dispense Refill  . amLODipine (NORVASC) 5 MG tablet TAKE 1 TABLET BY MOUTH DAILY 90 tablet 3  . diphenhydrAMINE (BENADRYL) 25 MG tablet Take 50 mg by mouth at bedtime as needed for sleep.    Marland Kitchen gabapentin (NEURONTIN) 100 MG capsule Take 1 capsule (100 mg total) by mouth 2 (two) times daily as needed (nerve pain). 90 capsule 3  . gabapentin (NEURONTIN) 300 MG capsule Take 1 capsule by mouth at bedtime 90 capsule 0  . levothyroxine (SYNTHROID) 112 MCG tablet Take 1 tablet (112 mcg total) by mouth daily. 90 tablet 3  . meloxicam (MOBIC) 15 MG tablet Take 1 tablet (15 mg total) by mouth daily. Annual appt due in Sept must see provider for future refills 30 tablet 2  . omeprazole (PRILOSEC) 20 MG capsule TAKE 1 CAPSULE BY MOUTH DAILY 90 capsule 3  . tiZANidine (ZANAFLEX) 4 MG tablet TAKE 1 TABLET BY MOUTH EVERY 6 HOURS AS NEEDED FOR MUSCLE SPASM 40 tablet 0   No current facility-administered medications on file prior to visit.     Review of Systems     Objective:  There were no vitals filed for this visit. BP Readings from Last 3 Encounters:  09/11/22 130/80  06/05/22 132/84  03/06/22 120/82   Wt Readings from Last 3 Encounters:  09/11/22 200 lb (90.7 kg)  06/05/22 200 lb (90.7 kg)  03/06/22 195 lb (88.5 kg)   There is no height or weight on file to calculate BMI.    Physical Exam     Lab Results  Component Value Date   WBC 3.6 (L) 03/06/2022   HGB 14.4 03/06/2022   HCT 42.6 03/06/2022   PLT 243.0  03/06/2022   GLUCOSE 81 09/11/2022   CHOL 203 (H) 03/06/2022   TRIG 64.0 03/06/2022   HDL 70.60 03/06/2022   LDLCALC 120 (H) 03/06/2022   ALT 22 09/11/2022   AST 28 09/11/2022   NA 137 09/11/2022   K 4.7 09/11/2022   CL 100 09/11/2022   CREATININE 1.06 09/11/2022   BUN 20 09/11/2022   CO2 29 09/11/2022   TSH 5.11 09/11/2022   INR 1.0 06/08/2021   HGBA1C 6.0 09/11/2022     Assessment & Plan:    See Problem List for Assessment and Plan of chronic medical problems.    This encounter was created in error - please disregard.

## 2023-03-13 NOTE — Patient Instructions (Addendum)
      Blood work was ordered.   The lab is on the first floor.    Medications changes include :       A referral was ordered and someone will call you to schedule an appointment.     Return in about 6 months (around 09/11/2023) for Physical Exam.

## 2023-03-14 ENCOUNTER — Encounter: Payer: BC Managed Care – PPO | Admitting: Internal Medicine

## 2023-03-14 DIAGNOSIS — I10 Essential (primary) hypertension: Secondary | ICD-10-CM

## 2023-03-14 DIAGNOSIS — R7303 Prediabetes: Secondary | ICD-10-CM

## 2023-03-14 DIAGNOSIS — E890 Postprocedural hypothyroidism: Secondary | ICD-10-CM

## 2023-03-14 DIAGNOSIS — F32A Depression, unspecified: Secondary | ICD-10-CM

## 2023-03-14 DIAGNOSIS — M1712 Unilateral primary osteoarthritis, left knee: Secondary | ICD-10-CM

## 2023-03-14 DIAGNOSIS — K219 Gastro-esophageal reflux disease without esophagitis: Secondary | ICD-10-CM

## 2023-03-14 DIAGNOSIS — E559 Vitamin D deficiency, unspecified: Secondary | ICD-10-CM

## 2023-03-14 NOTE — Assessment & Plan Note (Signed)
Chronic  Clinically euthyroid Check tsh, ft4 and will titrate med dose if needed Currently taking levothyroxine 112 mcg daily

## 2023-03-14 NOTE — Assessment & Plan Note (Signed)
Chronic Check a1c Low sugar / carb diet Stressed regular exercise  Lab Results  Component Value Date   HGBA1C 6.0 09/11/2022

## 2023-03-14 NOTE — Assessment & Plan Note (Signed)
Chronic Has seen Dr. Katrinka Blazing in the past Currently taking meloxicam 15 mg daily as needed

## 2023-03-14 NOTE — Assessment & Plan Note (Signed)
Chronic Controlled, Stable Continue duloxetine 30 mg daily 

## 2023-03-14 NOTE — Assessment & Plan Note (Signed)
Chronic Check vitamin D level 

## 2023-03-14 NOTE — Assessment & Plan Note (Signed)
Chronic GERD controlled Continue omeprazole 20 mg daily Work on weight loss and diet

## 2023-03-14 NOTE — Assessment & Plan Note (Signed)
Chronic Blood pressure well controlled CMP Continue amlodipine 5 mg daily, losartan 50 mg daily 

## 2023-03-15 DIAGNOSIS — M6281 Muscle weakness (generalized): Secondary | ICD-10-CM | POA: Diagnosis not present

## 2023-03-15 DIAGNOSIS — M25511 Pain in right shoulder: Secondary | ICD-10-CM | POA: Diagnosis not present

## 2023-03-15 DIAGNOSIS — M25611 Stiffness of right shoulder, not elsewhere classified: Secondary | ICD-10-CM | POA: Diagnosis not present

## 2023-03-15 DIAGNOSIS — R293 Abnormal posture: Secondary | ICD-10-CM | POA: Diagnosis not present

## 2023-03-17 ENCOUNTER — Encounter: Payer: Self-pay | Admitting: Internal Medicine

## 2023-03-17 NOTE — Progress Notes (Deleted)
      Subjective:    Patient ID: Alyssa [name], female    DOB: 02-10-1961, 62 y.o.   MRN: [id number]     HPI Alyssa [name] is here for follow up of her chronic medical problems.  Had Ct chest scheduled for this am to f/u pulm nodules - they were small but h/o ovarian ca.  She did not go to her visit.  Follow up thyroid -   Medications and allergies reviewed with patient and updated if appropriate.  Current Outpatient Medications on File Prior to Visit  Medication Sig Dispense Refill   amLODipine (NORVASC) 5 MG tablet TAKE 1 TABLET BY MOUTH DAILY 90 tablet 3   diphenhydrAMINE (BENADRYL) 25 MG tablet Take 50 mg by mouth at bedtime as needed for sleep.     DULoxetine (CYMBALTA) 30 MG capsule TAKE 1 CAPSULE BY MOUTH DAILY 90 capsule 2   gabapentin (NEURONTIN) 100 MG capsule Take 1 capsule (100 mg total) by mouth 2 (two) times daily as needed (nerve pain). 90 capsule 3   gabapentin (NEURONTIN) 300 MG capsule Take 1 capsule by mouth at bedtime 90 capsule 0   levothyroxine (SYNTHROID) 112 MCG tablet Take 1 tablet (112 mcg total) by mouth daily. 90 tablet 3   losartan (COZAAR) 50 MG tablet TAKE 1 TABLET BY MOUTH DAILY 90 tablet 2   meloxicam (MOBIC) 15 MG tablet Take 1 tablet (15 mg total) by mouth daily. Annual appt due in Sept must see provider for future refills 30 tablet 2   omeprazole (PRILOSEC) 20 MG capsule TAKE 1 CAPSULE BY MOUTH DAILY 90 capsule 3   tiZANidine (ZANAFLEX) 4 MG tablet TAKE 1 TABLET BY MOUTH EVERY 6 HOURS AS NEEDED FOR MUSCLE SPASM 40 tablet 0   No current facility-administered medications on file prior to visit.     Review of Systems     Objective:  There were no vitals filed for this visit. BP Readings from Last 3 Encounters:  09/11/22 130/80  06/05/22 132/84  03/06/22 120/82   Wt Readings from Last 3 Encounters:  09/11/22 200 lb (90.7 kg)  06/05/22 200 lb (90.7 kg)  03/06/22 195 lb (88.5 kg)   There is no height or weight on file to calculate BMI.     Physical Exam     Lab Results  Component Value Date   WBC 3.6 (L) 03/06/2022   HGB 14.4 03/06/2022   HCT 42.6 03/06/2022   PLT 243.0 03/06/2022   GLUCOSE 81 09/11/2022   CHOL 203 (H) 03/06/2022   TRIG 64.0 03/06/2022   HDL 70.60 03/06/2022   LDLCALC 120 (H) 03/06/2022   ALT 22 09/11/2022   AST 28 09/11/2022   NA 137 09/11/2022   K 4.7 09/11/2022   CL 100 09/11/2022   CREATININE 1.06 09/11/2022   BUN 20 09/11/2022   CO2 29 09/11/2022   TSH 5.11 09/11/2022   INR 1.0 06/08/2021   HGBA1C 6.0 09/11/2022     Assessment & Plan:    See Problem List for Assessment and Plan of chronic medical problems.

## 2023-03-18 ENCOUNTER — Ambulatory Visit: Payer: BC Managed Care – PPO | Admitting: Internal Medicine

## 2023-03-18 ENCOUNTER — Encounter: Payer: Self-pay | Admitting: Internal Medicine

## 2023-03-18 DIAGNOSIS — I10 Essential (primary) hypertension: Secondary | ICD-10-CM

## 2023-03-18 DIAGNOSIS — E890 Postprocedural hypothyroidism: Secondary | ICD-10-CM

## 2023-03-18 NOTE — Progress Notes (Deleted)
[name] [address] [place] [address] Phone: [phone] Subjective:    I'm seeing this patient by the request  of:  Pincus Sanes, MD  CC: right hip pain   [id number]  09/11/2022 Some worsening discomfort again.  Could consider injection but at the moment given Toradol and Depo-Medrol secondary to this as well as more of the thoracic pain.  Discussed with patient about continuing the home exercises and icing regimen, increase activity slowly.  Follow-up again in 6 to 8 weeks     Patient given injection and tolerated the procedure well, discussed icing regimen and home exercises, discussed which activities to do and which ones to avoid.  Differential includes cervical radiculopathy, we did discuss x-rays with patient's history of different cancers, patient wants to avoid that at the moment.  Discussed icing regimen and home exercises otherwise and follow-up with me again in 6 to 8 weeks.  Had improvement in range of motion almost immediately.     Updated 03/19/2023 A[name] is a 62 y.o. female coming in with complaint of R hip pain, has been seen previously and diagnosed with more of a greater trochanteric bursitis in March of this year, at that time was given an injection more for her shoulder being the main complaint.  Patient states   Since we have seen patient patient did have a wrist fracture in April of this year.  Still awaiting CT of the chest to follow-up on pulmonary nodules.    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   Pre-diabetes    SVD (spontaneous vaginal delivery)    x 3   Past Surgical History:  Procedure Laterality Date   ABDOMINAL HYSTERECTOMY     APPENDECTOMY     with hysterectomy   ENDOMETRIAL ABLATION  2002   LAPAROSCOPIC CHOLECYSTECTOMY   12/09/2016   THYROID LOBECTOMY N/A 01/22/2022   Procedure: THYROID ISTHMUSECTOMY WITH RESECTION OF SUBSTERNAL MASS;  Surgeon: Darnell Level, MD;  Location: [facility];  Service: General;  Laterality: N/A;   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   Procedure: LEFT TOTAL KNEE ARTHROPLASTY;  Surgeon: Marcene Corning, MD;  Location: [facility];  Service: Orthopedics;  Laterality: Left;   TUBAL LIGATION  1991   WISDOM TOOTH EXTRACTION     Social History   Socioeconomic History   Marital status: Married    Spouse name: Not on file   Number of children: 3   Years of education: Not on file   Highest education level: Associate degree: occupational, Scientist, product/process development, or vocational program  Occupational History   Not on file  Tobacco Use   Smoking status: Never   Smokeless tobacco: Never  Vaping Use   Vaping status: Never Used  Substance and Sexual Activity   Alcohol use: No   Drug use: No   Sexual activity: Yes    Birth control/protection: Post-menopausal    Comment: Hysterctomy  Other Topics Concern   Not on file  Social History Narrative   Not on file   Social Determinants of Health   Financial Resource Strain: Low Risk  (02/15/2023)   Overall Financial Resource Strain (CARDIA)    Difficulty of Paying Living Expenses: Not very hard  Food Insecurity: Unknown (02/15/2023)   Hunger Vital Sign  Worried About Programme researcher, broadcasting/film/video in the Last Year: Not on file    [employer] of Food in the Last Year: Never true  Transportation Needs: No Transportation Needs (02/15/2023)   PRAPARE - Administrator, Civil Service (Medical): No    Lack of Transportation (Non-Medical): No  Physical Activity: Unknown (02/15/2023)   Exercise Vital Sign    Days of Exercise per Week: 0 days    Minutes of Exercise per Session: Not on file  Stress: No Stress Concern Present (02/15/2023)   [employer] of Occupational Health - Occupational Stress Questionnaire    Feeling of Stress : Only a little  Social  Connections: Moderately Isolated (02/15/2023)   Social Connection and Isolation Panel [NHANES]    Frequency of Communication with Friends and Family: More than three times a week    Frequency of Social Gatherings with Friends and Family: Once a week    Attends Religious Services: Never    Database administrator or Organizations: No    Attends Engineer, structural: Not on file    Marital Status: Married   Allergies  Allergen Reactions   Codeine Itching   Doxycycline Other (See Comments)    chest pain   Penicillins Nausea And Vomiting   Prednisone Anxiety    severe anxiety   Family History  Problem Relation Age of Onset   Cancer Mother 43       vulvar cancer   Lung cancer Mother 40   Stroke Father    Cancer - Lung Sister    Suicidality Sister    Suicidality Brother    Arthritis Brother    Prostate cancer Maternal Uncle    Prostate cancer Maternal Uncle    Stroke Maternal Grandmother    Prostate cancer Maternal Grandfather    Cervical cancer Paternal Grandmother    Throat cancer Paternal Grandfather    Colon cancer Neg Hx    Rectal cancer Neg Hx    Stomach cancer Neg Hx     Current Outpatient Medications (Endocrine & Metabolic):    levothyroxine (SYNTHROID) 112 MCG tablet, Take 1 tablet (112 mcg total) by mouth daily.  Current Outpatient Medications (Cardiovascular):    amLODipine (NORVASC) 5 MG tablet, TAKE 1 TABLET BY MOUTH DAILY   losartan (COZAAR) 50 MG tablet, TAKE 1 TABLET BY MOUTH DAILY  Current Outpatient Medications (Respiratory):    diphenhydrAMINE (BENADRYL) 25 MG tablet, Take 50 mg by mouth at bedtime as needed for sleep.  Current Outpatient Medications (Analgesics):    meloxicam (MOBIC) 15 MG tablet, Take 1 tablet (15 mg total) by mouth daily. Annual appt due in Sept must see provider for future refills   Current Outpatient Medications (Other):    DULoxetine (CYMBALTA) 30 MG capsule, TAKE 1 CAPSULE BY MOUTH DAILY   gabapentin (NEURONTIN) 100 MG  capsule, Take 1 capsule (100 mg total) by mouth 2 (two) times daily as needed (nerve pain).   gabapentin (NEURONTIN) 300 MG capsule, Take 1 capsule by mouth at bedtime   omeprazole (PRILOSEC) 20 MG capsule, TAKE 1 CAPSULE BY MOUTH DAILY   tiZANidine (ZANAFLEX) 4 MG tablet, TAKE 1 TABLET BY MOUTH EVERY 6 HOURS AS NEEDED FOR MUSCLE SPASM   Reviewed prior external information including notes and imaging from  primary care provider As well as notes that were available from care everywhere and other healthcare systems.  Past medical history, social, surgical and family history all reviewed in electronic medical record.  No pertanent information  unless stated regarding to the chief complaint.   Review of Systems:  No headache, visual changes, nausea, vomiting, diarrhea, constipation, dizziness, abdominal pain, skin rash, fevers, chills, night sweats, weight loss, swollen lymph nodes, body aches, joint swelling, chest pain, shortness of breath, mood changes. POSITIVE muscle aches  Objective  There were no vitals taken for this visit.   General: No apparent distress alert and oriented x3 mood and affect normal, dressed appropriately.  HEENT: Pupils equal, extraocular movements intact  Respiratory: Patient's speak in full sentences and does not appear short of breath  Cardiovascular: No lower extremity edema, non tender, no erythema  Hip exam shows     Impression and Recommendations:     The above documentation has been reviewed and is accurate and complete Judi Saa, DO

## 2023-03-19 ENCOUNTER — Ambulatory Visit: Payer: BC Managed Care – PPO | Admitting: Family Medicine

## 2023-03-19 ENCOUNTER — Encounter: Payer: Self-pay | Admitting: Internal Medicine

## 2023-03-19 ENCOUNTER — Ambulatory Visit: Payer: BC Managed Care – PPO | Admitting: Internal Medicine

## 2023-03-20 ENCOUNTER — Other Ambulatory Visit: Payer: Self-pay | Admitting: Internal Medicine

## 2023-03-21 DIAGNOSIS — R293 Abnormal posture: Secondary | ICD-10-CM | POA: Diagnosis not present

## 2023-03-21 DIAGNOSIS — M6281 Muscle weakness (generalized): Secondary | ICD-10-CM | POA: Diagnosis not present

## 2023-03-21 DIAGNOSIS — M25611 Stiffness of right shoulder, not elsewhere classified: Secondary | ICD-10-CM | POA: Diagnosis not present

## 2023-03-21 DIAGNOSIS — M25511 Pain in right shoulder: Secondary | ICD-10-CM | POA: Diagnosis not present

## 2023-03-22 DIAGNOSIS — M25611 Stiffness of right shoulder, not elsewhere classified: Secondary | ICD-10-CM | POA: Diagnosis not present

## 2023-03-22 DIAGNOSIS — M25511 Pain in right shoulder: Secondary | ICD-10-CM | POA: Diagnosis not present

## 2023-03-22 DIAGNOSIS — R293 Abnormal posture: Secondary | ICD-10-CM | POA: Diagnosis not present

## 2023-03-22 DIAGNOSIS — M6281 Muscle weakness (generalized): Secondary | ICD-10-CM | POA: Diagnosis not present

## 2023-03-24 ENCOUNTER — Encounter: Payer: Self-pay | Admitting: Internal Medicine

## 2023-03-25 ENCOUNTER — Ambulatory Visit: Payer: BC Managed Care – PPO | Admitting: Internal Medicine

## 2023-03-27 ENCOUNTER — Ambulatory Visit: Payer: BC Managed Care – PPO | Admitting: Family Medicine

## 2023-03-28 DIAGNOSIS — R293 Abnormal posture: Secondary | ICD-10-CM | POA: Diagnosis not present

## 2023-03-28 DIAGNOSIS — M25611 Stiffness of right shoulder, not elsewhere classified: Secondary | ICD-10-CM | POA: Diagnosis not present

## 2023-03-28 DIAGNOSIS — M6281 Muscle weakness (generalized): Secondary | ICD-10-CM | POA: Diagnosis not present

## 2023-03-28 DIAGNOSIS — M25511 Pain in right shoulder: Secondary | ICD-10-CM | POA: Diagnosis not present

## 2023-03-29 DIAGNOSIS — M25611 Stiffness of right shoulder, not elsewhere classified: Secondary | ICD-10-CM | POA: Diagnosis not present

## 2023-03-29 DIAGNOSIS — M6281 Muscle weakness (generalized): Secondary | ICD-10-CM | POA: Diagnosis not present

## 2023-03-29 DIAGNOSIS — R293 Abnormal posture: Secondary | ICD-10-CM | POA: Diagnosis not present

## 2023-03-29 DIAGNOSIS — M25511 Pain in right shoulder: Secondary | ICD-10-CM | POA: Diagnosis not present

## 2023-04-03 NOTE — Progress Notes (Deleted)
[name] [address] [place] [address] Phone: [phone] Subjective:    I'm seeing this patient by the request  of:  Burns, Bobette Mo, MD  CC:   [id number]  [name] is a 62 y.o. female coming in with complaint of knee and R hip pain. Last seen in March for hip and shoulder pain. 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   Pre-diabetes    SVD (spontaneous vaginal delivery)    x 3   Past Surgical History:  Procedure Laterality Date   ABDOMINAL HYSTERECTOMY     APPENDECTOMY     with hysterectomy   ENDOMETRIAL ABLATION  2002   LAPAROSCOPIC CHOLECYSTECTOMY  12/09/2016   THYROID LOBECTOMY N/A 01/22/2022   Procedure: THYROID ISTHMUSECTOMY WITH RESECTION OF SUBSTERNAL MASS;  Surgeon: Darnell Level, MD;  Location: [facility];  Service: General;  Laterality: N/A;   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   Procedure: LEFT TOTAL KNEE ARTHROPLASTY;  Surgeon: Marcene Corning, MD;  Location: [facility];  Service: Orthopedics;  Laterality: Left;   TUBAL LIGATION  1991   WISDOM TOOTH EXTRACTION     Social History   Socioeconomic History   Marital status: Married    Spouse name: Not on file   Number of children: 3   Years of education: Not on file   Highest education level: Associate degree: occupational, Scientist, product/process development, or vocational program  Occupational History   Not on file  Tobacco Use   Smoking status: Never   Smokeless tobacco: Never  Vaping Use   Vaping status: Never Used  Substance and Sexual Activity   Alcohol use: No   Drug use: No   Sexual activity: Yes    Birth control/protection: Post-menopausal    Comment: Hysterctomy  Other Topics Concern   Not on file  Social History Narrative   Not on file   Social Determinants of Health   Financial  Resource Strain: Low Risk  (02/15/2023)   Overall Financial Resource Strain (CARDIA)    Difficulty of Paying Living Expenses: Not very hard  Food Insecurity: Unknown (02/15/2023)   Hunger Vital Sign    Worried About Running Out of Food in the Last Year: Not on file    Ran Out of Food in the Last Year: Never true  Transportation Needs: No Transportation Needs (02/15/2023)   PRAPARE - Administrator, Civil Service (Medical): No    Lack of Transportation (Non-Medical): No  Physical Activity: Unknown (02/15/2023)   Exercise Vital Sign    Days of Exercise per Week: 0 days    Minutes of Exercise per Session: Not on file  Stress: No Stress Concern Present (02/15/2023)   [employer] of Occupational Health - Occupational Stress Questionnaire    Feeling of Stress : Only a little  Social Connections: Moderately Isolated (02/15/2023)   Social Connection and Isolation Panel [NHANES]    Frequency of Communication with Friends and Family: More than three times a week    Frequency of Social Gatherings with Friends and Family: Once a week    Attends Religious Services: Never    Database administrator or Organizations: No    Attends Banker Meetings: Not on file  Marital Status: Married   Allergies  Allergen Reactions   Codeine Itching   Doxycycline Other (See Comments)    chest pain   Penicillins Nausea And Vomiting   Prednisone Anxiety    severe anxiety   Family History  Problem Relation Age of Onset   Cancer Mother 39       vulvar cancer   Lung cancer Mother 98   Stroke Father    Cancer - Lung Sister    Suicidality Sister    Suicidality Brother    Arthritis Brother    Prostate cancer Maternal Uncle    Prostate cancer Maternal Uncle    Stroke Maternal Grandmother    Prostate cancer Maternal Grandfather    Cervical cancer Paternal Grandmother    Throat cancer Paternal Grandfather    Colon cancer Neg Hx    Rectal cancer Neg Hx    Stomach cancer Neg Hx      Current Outpatient Medications (Endocrine & Metabolic):    levothyroxine (SYNTHROID) 112 MCG tablet, Take 1 tablet (112 mcg total) by mouth daily.  Current Outpatient Medications (Cardiovascular):    amLODipine (NORVASC) 5 MG tablet, TAKE 1 TABLET BY MOUTH DAILY   losartan (COZAAR) 50 MG tablet, TAKE 1 TABLET BY MOUTH DAILY  Current Outpatient Medications (Respiratory):    diphenhydrAMINE (BENADRYL) 25 MG tablet, Take 50 mg by mouth at bedtime as needed for sleep.  Current Outpatient Medications (Analgesics):    meloxicam (MOBIC) 15 MG tablet, Take 1 tablet (15 mg total) by mouth daily. Annual appt due in Sept must see provider for future refills   Current Outpatient Medications (Other):    DULoxetine (CYMBALTA) 30 MG capsule, TAKE 1 CAPSULE BY MOUTH DAILY   gabapentin (NEURONTIN) 100 MG capsule, Take 1 capsule (100 mg total) by mouth 2 (two) times daily as needed (nerve pain).   gabapentin (NEURONTIN) 300 MG capsule, Take 1 capsule by mouth at bedtime   omeprazole (PRILOSEC) 20 MG capsule, TAKE 1 CAPSULE BY MOUTH DAILY   tiZANidine (ZANAFLEX) 4 MG tablet, TAKE 1 TABLET BY MOUTH EVERY 6 HOURS AS NEEDED FOR MUSCLE SPASM   Reviewed prior external information including notes and imaging from  primary care provider As well as notes that were available from care everywhere and other healthcare systems.  Past medical history, social, surgical and family history all reviewed in electronic medical record.  No pertanent information unless stated regarding to the chief complaint.   Review of Systems:  No headache, visual changes, nausea, vomiting, diarrhea, constipation, dizziness, abdominal pain, skin rash, fevers, chills, night sweats, weight loss, swollen lymph nodes, body aches, joint swelling, chest pain, shortness of breath, mood changes. POSITIVE muscle aches  Objective  There were no vitals taken for this visit.   General: No apparent distress alert and oriented x3 mood and  affect normal, dressed appropriately.  HEENT: Pupils equal, extraocular movements intact  Respiratory: Patient's speak in full sentences and does not appear short of breath  Cardiovascular: No lower extremity edema, non tender, no erythema      Impression and Recommendations:

## 2023-04-04 ENCOUNTER — Ambulatory Visit: Payer: BC Managed Care – PPO | Admitting: Family Medicine

## 2023-04-05 DIAGNOSIS — M25511 Pain in right shoulder: Secondary | ICD-10-CM | POA: Diagnosis not present

## 2023-04-09 ENCOUNTER — Other Ambulatory Visit: Payer: Self-pay | Admitting: Internal Medicine

## 2023-04-09 DIAGNOSIS — N838 Other noninflammatory disorders of ovary, fallopian tube and broad ligament: Secondary | ICD-10-CM

## 2023-04-16 NOTE — Progress Notes (Deleted)
[name] [address] [place] [address] Phone: [phone] Subjective:    I'm seeing this patient by the request  of:  Pincus Sanes, MD  CC:   [id number]  09/11/2022 Some worsening discomfort again.  Could consider injection but at the moment given Toradol and Depo-Medrol secondary to this as well as more of the thoracic pain.  Discussed with patient about continuing the home exercises and icing regimen, increase activity slowly.  Follow-up again in 6 to 8 weeks     Patient given injection and tolerated the procedure well, discussed icing regimen and home exercises, discussed which activities to do and which ones to avoid.  Differential includes cervical radiculopathy, we did discuss x-rays with patient's history of different cancers, patient wants to avoid that at the moment.  Discussed icing regimen and home exercises otherwise and follow-up with me again in 6 to 8 weeks.  Had improvement in range of motion almost immediately.      Update 04/17/2023 [name] is a 62 y.o. female coming in with complaint of R shoulder and R hip pain. 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   Pre-diabetes    SVD (spontaneous vaginal delivery)    x 3   Past Surgical History:  Procedure Laterality Date   ABDOMINAL HYSTERECTOMY     APPENDECTOMY     with hysterectomy   ENDOMETRIAL ABLATION  2002   LAPAROSCOPIC CHOLECYSTECTOMY  12/09/2016   THYROID LOBECTOMY N/A 01/22/2022   Procedure: THYROID ISTHMUSECTOMY WITH RESECTION OF SUBSTERNAL MASS;  Surgeon: Darnell Level, MD;  Location: [facility];  Service: General;  Laterality: N/A;   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   Procedure: LEFT TOTAL KNEE ARTHROPLASTY;  Surgeon: Marcene Corning, MD;  Location: [facility];  Service:  Orthopedics;  Laterality: Left;   TUBAL LIGATION  1991   WISDOM TOOTH EXTRACTION     Social History   Socioeconomic History   Marital status: Married    Spouse name: Not on file   Number of children: 3   Years of education: Not on file   Highest education level: Associate degree: occupational, Scientist, product/process development, or vocational program  Occupational History   Not on file  Tobacco Use   Smoking status: Never   Smokeless tobacco: Never  Vaping Use   Vaping status: Never Used  Substance and Sexual Activity   Alcohol use: No   Drug use: No   Sexual activity: Yes    Birth control/protection: Post-menopausal    Comment: Hysterctomy  Other Topics Concern   Not on file  Social History Narrative   Not on file   Social Determinants of Health   Financial Resource Strain: Low Risk  (02/15/2023)   Overall Financial Resource Strain (CARDIA)    Difficulty of Paying Living Expenses: Not very hard  Food Insecurity: Unknown (02/15/2023)   Hunger Vital Sign    Worried About Running Out of Food in the Last Year: Not on file    Ran Out of Food in the Last Year: Never true  Transportation Needs: No Transportation Needs (02/15/2023)   PRAPARE - Administrator, Civil Service (Medical): No    Lack of Transportation (Non-Medical): No  Physical Activity: Unknown (02/15/2023)  Exercise Vital Sign    Days of Exercise per Week: 0 days    Minutes of Exercise per Session: Not on file  Stress: No Stress Concern Present (02/15/2023)   [employer] of Occupational Health - Occupational Stress Questionnaire    Feeling of Stress : Only a little  Social Connections: Moderately Isolated (02/15/2023)   Social Connection and Isolation Panel [NHANES]    Frequency of Communication with Friends and Family: More than three times a week    Frequency of Social Gatherings with Friends and Family: Once a week    Attends Religious Services: Never    Database administrator or Organizations: No    Attends Probation officer: Not on file    Marital Status: Married   Allergies  Allergen Reactions   Codeine Itching   Doxycycline Other (See Comments)    chest pain   Penicillins Nausea And Vomiting   Prednisone Anxiety    severe anxiety   Family History  Problem Relation Age of Onset   Cancer Mother 55       vulvar cancer   Lung cancer Mother 88   Stroke Father    Cancer - Lung Sister    Suicidality Sister    Suicidality Brother    Arthritis Brother    Prostate cancer Maternal Uncle    Prostate cancer Maternal Uncle    Stroke Maternal Grandmother    Prostate cancer Maternal Grandfather    Cervical cancer Paternal Grandmother    Throat cancer Paternal Grandfather    Colon cancer Neg Hx    Rectal cancer Neg Hx    Stomach cancer Neg Hx     Current Outpatient Medications (Endocrine & Metabolic):    levothyroxine (SYNTHROID) 112 MCG tablet, Take 1 tablet (112 mcg total) by mouth daily.  Current Outpatient Medications (Cardiovascular):    amLODipine (NORVASC) 5 MG tablet, TAKE 1 TABLET BY MOUTH DAILY   losartan (COZAAR) 50 MG tablet, TAKE 1 TABLET BY MOUTH DAILY  Current Outpatient Medications (Respiratory):    diphenhydrAMINE (BENADRYL) 25 MG tablet, Take 50 mg by mouth at bedtime as needed for sleep.  Current Outpatient Medications (Analgesics):    meloxicam (MOBIC) 15 MG tablet, Take 1 tablet (15 mg total) by mouth daily. Annual appt due in Sept must see provider for future refills   Current Outpatient Medications (Other):    DULoxetine (CYMBALTA) 30 MG capsule, TAKE 1 CAPSULE BY MOUTH DAILY   gabapentin (NEURONTIN) 100 MG capsule, Take 1 capsule (100 mg total) by mouth 2 (two) times daily as needed (nerve pain).   gabapentin (NEURONTIN) 300 MG capsule, Take 1 capsule by mouth at bedtime   omeprazole (PRILOSEC) 20 MG capsule, TAKE 1 CAPSULE BY MOUTH DAILY   tiZANidine (ZANAFLEX) 4 MG tablet, TAKE 1 TABLET BY MOUTH EVERY 6 HOURS AS NEEDED FOR MUSCLE SPASM   Reviewed  prior external information including notes and imaging from  primary care provider As well as notes that were available from care everywhere and other healthcare systems.  Past medical history, social, surgical and family history all reviewed in electronic medical record.  No pertanent information unless stated regarding to the chief complaint.   Review of Systems:  No headache, visual changes, nausea, vomiting, diarrhea, constipation, dizziness, abdominal pain, skin rash, fevers, chills, night sweats, weight loss, swollen lymph nodes, body aches, joint swelling, chest pain, shortness of breath, mood changes. POSITIVE muscle aches  Objective  There were no vitals taken for this visit.  General: No apparent distress alert and oriented x3 mood and affect normal, dressed appropriately.  HEENT: Pupils equal, extraocular movements intact  Respiratory: Patient's speak in full sentences and does not appear short of breath  Cardiovascular: No lower extremity edema, non tender, no erythema      Impression and Recommendations:

## 2023-04-17 ENCOUNTER — Ambulatory Visit: Payer: BC Managed Care – PPO | Admitting: Family Medicine

## 2023-04-17 ENCOUNTER — Other Ambulatory Visit: Payer: Self-pay | Admitting: Internal Medicine

## 2023-04-19 ENCOUNTER — Ambulatory Visit: Payer: BC Managed Care – PPO | Admitting: Family Medicine

## 2023-04-30 ENCOUNTER — Ambulatory Visit: Payer: BC Managed Care – PPO | Admitting: Family Medicine

## 2023-05-08 NOTE — Progress Notes (Deleted)
[name] [address] [place] [address] Phone: [phone] Subjective:    I'm seeing this patient by the request  of:  Pincus Sanes, MD  CC:   [id number]  09/11/2022 Some worsening discomfort again.  Could consider injection but at the moment given Toradol and Depo-Medrol secondary to this as well as more of the thoracic pain.  Discussed with patient about continuing the home exercises and icing regimen, increase activity slowly.  Follow-up again in 6 to 8 weeks     Patient given injection and tolerated the procedure well, discussed icing regimen and home exercises, discussed which activities to do and which ones to avoid.  Differential includes cervical radiculopathy, we did discuss x-rays with patient's history of different cancers, patient wants to avoid that at the moment.  Discussed icing regimen and home exercises otherwise and follow-up with me again in 6 to 8 weeks.  Had improvement in range of motion almost immediately.     Updated 05/09/2023 Alyssa [name] is a 62 y.o. 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   Pre-diabetes    SVD (spontaneous vaginal delivery)    x 3   Past Surgical History:  Procedure Laterality Date   ABDOMINAL HYSTERECTOMY     APPENDECTOMY     with hysterectomy   ENDOMETRIAL ABLATION  2002   LAPAROSCOPIC CHOLECYSTECTOMY  12/09/2016   THYROID LOBECTOMY N/A 01/22/2022   Procedure: THYROID ISTHMUSECTOMY WITH RESECTION OF SUBSTERNAL MASS;  Surgeon: Darnell Level, MD;  Location: [facility];  Service: General;  Laterality: N/A;   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   Procedure: LEFT TOTAL KNEE ARTHROPLASTY;  Surgeon: Marcene Corning, MD;  Location: [facility];  Service: Orthopedics;   Laterality: Left;   TUBAL LIGATION  1991   WISDOM TOOTH EXTRACTION     Social History   Socioeconomic History   Marital status: Married    Spouse name: Not on file   Number of children: 3   Years of education: Not on file   Highest education level: Associate degree: occupational, Scientist, product/process development, or vocational program  Occupational History   Not on file  Tobacco Use   Smoking status: Never   Smokeless tobacco: Never  Vaping Use   Vaping status: Never Used  Substance and Sexual Activity   Alcohol use: No   Drug use: No   Sexual activity: Yes    Birth control/protection: Post-menopausal    Comment: Hysterctomy  Other Topics Concern   Not on file  Social History Narrative   Not on file   Social Determinants of Health   Financial Resource Strain: Low Risk  (02/15/2023)   Overall Financial Resource Strain (CARDIA)    Difficulty of Paying Living Expenses: Not very hard  Food Insecurity: Unknown (02/15/2023)   Hunger Vital Sign    Worried About Running Out of Food in the Last Year: Not on file    Ran Out of Food in the Last Year: Never true  Transportation Needs: No Transportation Needs (02/15/2023)   PRAPARE - Administrator, Civil Service (Medical): No    Lack of Transportation (Non-Medical): No  Physical Activity: Unknown (02/15/2023)   Exercise Vital Sign  Days of Exercise per Week: 0 days    Minutes of Exercise per Session: Not on file  Stress: No Stress Concern Present (02/15/2023)   [employer] of Occupational Health - Occupational Stress Questionnaire    Feeling of Stress : Only a little  Social Connections: Moderately Isolated (02/15/2023)   Social Connection and Isolation Panel [NHANES]    Frequency of Communication with Friends and Family: More than three times a week    Frequency of Social Gatherings with Friends and Family: Once a week    Attends Religious Services: Never    Database administrator or Organizations: No    Attends Museum/gallery exhibitions officer: Not on file    Marital Status: Married   Allergies  Allergen Reactions   Codeine Itching   Doxycycline Other (See Comments)    chest pain   Penicillins Nausea And Vomiting   Prednisone Anxiety    severe anxiety   Family History  Problem Relation Age of Onset   Cancer Mother 21       vulvar cancer   Lung cancer Mother 68   Stroke Father    Cancer - Lung Sister    Suicidality Sister    Suicidality Brother    Arthritis Brother    Prostate cancer Maternal Uncle    Prostate cancer Maternal Uncle    Stroke Maternal Grandmother    Prostate cancer Maternal Grandfather    Cervical cancer Paternal Grandmother    Throat cancer Paternal Grandfather    Colon cancer Neg Hx    Rectal cancer Neg Hx    Stomach cancer Neg Hx     Current Outpatient Medications (Endocrine & Metabolic):    levothyroxine (SYNTHROID) 112 MCG tablet, Take 1 tablet (112 mcg total) by mouth daily.  Current Outpatient Medications (Cardiovascular):    amLODipine (NORVASC) 5 MG tablet, TAKE 1 TABLET BY MOUTH DAILY   losartan (COZAAR) 50 MG tablet, TAKE 1 TABLET BY MOUTH DAILY  Current Outpatient Medications (Respiratory):    diphenhydrAMINE (BENADRYL) 25 MG tablet, Take 50 mg by mouth at bedtime as needed for sleep.  Current Outpatient Medications (Analgesics):    meloxicam (MOBIC) 15 MG tablet, Take 1 tablet (15 mg total) by mouth daily. Annual appt due in Sept must see provider for future refills   Current Outpatient Medications (Other):    DULoxetine (CYMBALTA) 30 MG capsule, TAKE 1 CAPSULE BY MOUTH DAILY   gabapentin (NEURONTIN) 100 MG capsule, Take 1 capsule (100 mg total) by mouth 2 (two) times daily as needed (nerve pain).   gabapentin (NEURONTIN) 300 MG capsule, Take 1 capsule by mouth at bedtime   omeprazole (PRILOSEC) 20 MG capsule, TAKE 1 CAPSULE BY MOUTH DAILY   tiZANidine (ZANAFLEX) 4 MG tablet, TAKE 1 TABLET BY MOUTH EVERY 6 HOURS AS NEEDED FOR MUSCLE SPASM   Reviewed  prior external information including notes and imaging from  primary care provider As well as notes that were available from care everywhere and other healthcare systems.  Past medical history, social, surgical and family history all reviewed in electronic medical record.  No pertanent information unless stated regarding to the chief complaint.   Review of Systems:  No headache, visual changes, nausea, vomiting, diarrhea, constipation, dizziness, abdominal pain, skin rash, fevers, chills, night sweats, weight loss, swollen lymph nodes, body aches, joint swelling, chest pain, shortness of breath, mood changes. POSITIVE muscle aches  Objective  There were no vitals taken for this visit.   General: No apparent distress alert  and oriented x3 mood and affect normal, dressed appropriately.  HEENT: Pupils equal, extraocular movements intact  Respiratory: Patient's speak in full sentences and does not appear short of breath  Cardiovascular: No lower extremity edema, non tender, no erythema      Impression and Recommendations:

## 2023-05-09 ENCOUNTER — Ambulatory Visit: Payer: BC Managed Care – PPO | Admitting: Family Medicine

## 2023-05-15 ENCOUNTER — Ambulatory Visit: Payer: BC Managed Care – PPO | Admitting: Family Medicine

## 2023-05-25 ENCOUNTER — Other Ambulatory Visit: Payer: Self-pay | Admitting: Family Medicine

## 2023-05-27 ENCOUNTER — Other Ambulatory Visit: Payer: Self-pay | Admitting: Internal Medicine

## 2023-05-27 NOTE — Telephone Encounter (Signed)
Last OV 09/11/22 Next OV 06/14/23  Last refill 02/14/23 Qty # 90/0

## 2023-06-04 ENCOUNTER — Ambulatory Visit: Payer: BC Managed Care – PPO | Admitting: Family Medicine

## 2023-06-06 NOTE — Progress Notes (Unsigned)
[name] [address] [place] [address] Phone: [phone] Subjective:    I'm seeing this patient by the request  of:  Pincus Sanes, MD  CC: Right hip and right shoulder pain  [id number]  09/11/2022 Some worsening discomfort again. Could consider injection but at the moment given Toradol and Depo-Medrol secondary to this as well as more of the thoracic pain. Discussed with patient about continuing the home exercises and icing regimen, increase activity slowly. Follow-up again in 6 to 8 weeks   Patient given injection and tolerated the procedure well, discussed icing regimen and home exercises, discussed which activities to do and which ones to avoid.  Differential includes cervical radiculopathy, we did discuss x-rays with patient's history of different cancers, patient wants to avoid that at the moment.  Discussed icing regimen and home exercises otherwise and follow-up with me again in 6 to 8 weeks.  Had improvement in range of motion almost immediately.      Update 06/11/2023 Kiante Filippini is a 61 y.o. female coming in with complaint of R hip and R shoulder pain. 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   Pre-diabetes    SVD (spontaneous vaginal delivery)    x 3   Past Surgical History:  Procedure Laterality Date   ABDOMINAL HYSTERECTOMY     APPENDECTOMY     with hysterectomy   ENDOMETRIAL ABLATION  2002   LAPAROSCOPIC CHOLECYSTECTOMY  12/09/2016   THYROID LOBECTOMY N/A 01/22/2022   Procedure: THYROID ISTHMUSECTOMY WITH RESECTION OF SUBSTERNAL MASS;  Surgeon: Darnell Level, MD;  Location: [facility];  Service: General;  Laterality: N/A;   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   Procedure: LEFT TOTAL KNEE ARTHROPLASTY;  Surgeon: Marcene Corning,  MD;  Location: [facility];  Service: Orthopedics;  Laterality: Left;   TUBAL LIGATION  1991   WISDOM TOOTH EXTRACTION     Social History   Socioeconomic History   Marital status: Married    Spouse name: Not on file   Number of children: 3   Years of education: Not on file   Highest education level: Associate degree: occupational, Scientist, product/process development, or vocational program  Occupational History   Not on file  Tobacco Use   Smoking status: Never   Smokeless tobacco: Never  Vaping Use   Vaping status: Never Used  Substance and Sexual Activity   Alcohol use: No   Drug use: No   Sexual activity: Yes    Birth control/protection: Post-menopausal    Comment: Hysterctomy  Other Topics Concern   Not on file  Social History Narrative   Not on file   Social Determinants of Health   Financial Resource Strain: Low Risk  (02/15/2023)   Overall Financial Resource Strain (CARDIA)    Difficulty of Paying Living Expenses: Not very hard  Food Insecurity: Unknown (02/15/2023)   Hunger Vital Sign    Worried About Running Out of Food in the Last Year: Not on file    Ran Out of Food in the Last Year: Never true  Transportation Needs: No Transportation Needs (02/15/2023)   PRAPARE - Administrator, Civil Service (Medical): No    Lack of Transportation (Non-Medical): No  Physical Activity: Unknown (02/15/2023)  Exercise Vital Sign    Days of Exercise per Week: 0 days    Minutes of Exercise per Session: Not on file  Stress: No Stress Concern Present (02/15/2023)   [employer] of Occupational Health - Occupational Stress Questionnaire    Feeling of Stress : Only a little  Social Connections: Moderately Isolated (02/15/2023)   Social Connection and Isolation Panel [NHANES]    Frequency of Communication with Friends and Family: More than three times a week    Frequency of Social Gatherings with Friends and Family: Once a week    Attends Religious Services: Never    Database administrator or  Organizations: No    Attends Engineer, structural: Not on file    Marital Status: Married   Allergies  Allergen Reactions   Codeine Itching   Doxycycline Other (See Comments)    chest pain   Penicillins Nausea And Vomiting   Prednisone Anxiety    severe anxiety   Family History  Problem Relation Age of Onset   Cancer Mother 31       vulvar cancer   Lung cancer Mother 65   Stroke Father    Cancer - Lung Sister    Suicidality Sister    Suicidality Brother    Arthritis Brother    Prostate cancer Maternal Uncle    Prostate cancer Maternal Uncle    Stroke Maternal Grandmother    Prostate cancer Maternal Grandfather    Cervical cancer Paternal Grandmother    Throat cancer Paternal Grandfather    Colon cancer Neg Hx    Rectal cancer Neg Hx    Stomach cancer Neg Hx     Current Outpatient Medications (Endocrine & Metabolic):    levothyroxine (SYNTHROID) 112 MCG tablet, Take 1 tablet (112 mcg total) by mouth daily.  Current Outpatient Medications (Cardiovascular):    amLODipine (NORVASC) 5 MG tablet, TAKE 1 TABLET BY MOUTH DAILY   losartan (COZAAR) 50 MG tablet, TAKE 1 TABLET BY MOUTH DAILY  Current Outpatient Medications (Respiratory):    diphenhydrAMINE (BENADRYL) 25 MG tablet, Take 50 mg by mouth at bedtime as needed for sleep.  Current Outpatient Medications (Analgesics):    meloxicam (MOBIC) 15 MG tablet, Take 1 tablet (15 mg total) by mouth daily. Annual appt due in Sept must see provider for future refills   Current Outpatient Medications (Other):    DULoxetine (CYMBALTA) 30 MG capsule, TAKE 1 CAPSULE BY MOUTH DAILY   gabapentin (NEURONTIN) 100 MG capsule, Take 1 capsule (100 mg total) by mouth 2 (two) times daily as needed (nerve pain).   gabapentin (NEURONTIN) 300 MG capsule, Take 1 capsule by mouth at bedtime   omeprazole (PRILOSEC) 20 MG capsule, TAKE 1 CAPSULE BY MOUTH DAILY   tiZANidine (ZANAFLEX) 4 MG tablet, TAKE 1 TABLET BY MOUTH EVERY 6 HOURS AS  NEEDED FOR MUSCLE SPASM   Reviewed prior external information including notes and imaging from  primary care provider As well as notes that were available from care everywhere and other healthcare systems.  Past medical history, social, surgical and family history all reviewed in electronic medical record.  No pertanent information unless stated regarding to the chief complaint.   Review of Systems:  No headache, visual changes, nausea, vomiting, diarrhea, constipation, dizziness, abdominal pain, skin rash, fevers, chills, night sweats, weight loss, swollen lymph nodes, body aches, joint swelling, chest pain, shortness of breath, mood changes. POSITIVE muscle aches  Objective  There were no vitals taken for this visit.  General: No apparent distress alert and oriented x3 mood and affect normal, dressed appropriately.  HEENT: Pupils equal, extraocular movements intact  Respiratory: Patient's speak in full sentences and does not appear short of breath  Cardiovascular: No lower extremity edema, non tender, no erythema  Right hip exam shows  Right shoulder exam shows    Impression and Recommendations:    The above documentation has been reviewed and is accurate and complete Judi Saa, DO

## 2023-06-11 ENCOUNTER — Ambulatory Visit: Payer: BC Managed Care – PPO | Admitting: Family Medicine

## 2023-06-14 ENCOUNTER — Ambulatory Visit: Payer: BC Managed Care – PPO | Admitting: Family Medicine

## 2023-06-21 ENCOUNTER — Other Ambulatory Visit: Payer: Self-pay | Admitting: Internal Medicine

## 2023-06-21 ENCOUNTER — Other Ambulatory Visit: Payer: Self-pay | Admitting: Family Medicine

## 2023-06-23 ENCOUNTER — Other Ambulatory Visit: Payer: Self-pay | Admitting: Internal Medicine

## 2023-06-23 MED ORDER — GABAPENTIN 300 MG PO CAPS: 300.0000 mg | ORAL_CAPSULE | Freq: Every day | ORAL | 0 refills | Status: DC

## 2023-07-08 ENCOUNTER — Other Ambulatory Visit: Payer: Self-pay | Admitting: Internal Medicine

## 2023-07-17 NOTE — Progress Notes (Signed)
[name] [address] [place] [address] Phone: [phone] Subjective:   Bruce Donath, am serving as a scribe for Dr. Antoine Primas.  I'm seeing this patient by the request  of:  Pincus Sanes, MD  CC: Right hip and right leg pain, shoulder pain  [id number]  09/11/2022 Some worsening discomfort again.  Could consider injection but at the moment given Toradol and Depo-Medrol secondary to this as well as more of the thoracic pain.  Discussed with patient about continuing the home exercises and icing regimen, increase activity slowly.  Follow-up again in 6 to 8 weeks     Patient given injection and tolerated the procedure well, discussed icing regimen and home exercises, discussed which activities to do and which ones to avoid.  Differential includes cervical radiculopathy, we did discuss x-rays with patient's history of different cancers, patient wants to avoid that at the moment.  Discussed icing regimen and home exercises otherwise and follow-up with me again in 6 to 8 weeks.  Had improvement in range of motion almost immediately.      Update 07/18/2023 Alyssa [name] is a 63 y.o. female coming in with complaint of R hip and R shoulder pain.  Patient has been seen previously and given an injection in December for greater trochanteric bursitis.  Has had signs and symptoms consistent with gluteal tendinitis previously as well.  Regarding her shoulder patient has had an injection in March 2024.  Patient states that she had surgery in May 2024 by Dr. Jerl Santos. Pain in anterior and lateral shoulder. Pain is weak as well.   R hip pain is causing to her wake up at night. Put on Voltaren gel in middle. Pain in back of hip.   Pain in R midfoot. Standing at work increases her pain.     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   Pre-diabetes    SVD (spontaneous vaginal delivery)    x 3   Past Surgical History:  Procedure Laterality Date   ABDOMINAL HYSTERECTOMY     APPENDECTOMY     with hysterectomy   ENDOMETRIAL ABLATION  2002   LAPAROSCOPIC CHOLECYSTECTOMY  12/09/2016   THYROID LOBECTOMY N/A 01/22/2022   Procedure: THYROID ISTHMUSECTOMY WITH RESECTION OF SUBSTERNAL MASS;  Surgeon: Darnell Level, MD;  Location: [facility];  Service: General;  Laterality: N/A;   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   Procedure: LEFT TOTAL KNEE ARTHROPLASTY;  Surgeon: Marcene Corning, MD;  Location: [facility];  Service: Orthopedics;  Laterality: Left;   TUBAL LIGATION  1991   WISDOM TOOTH EXTRACTION     Social History   Socioeconomic History   Marital status: Married    Spouse name: Not on file   Number of children: 3   Years of education: Not on file   Highest education level: Associate degree: occupational, Scientist, product/process development, or vocational program  Occupational History   Not on file  Tobacco Use   Smoking status: Never   Smokeless tobacco: Never  Vaping Use   Vaping status: Never Used  Substance and Sexual Activity   Alcohol use: No   Drug use: No   Sexual activity: Yes  Birth control/protection: Post-menopausal    Comment: Hysterctomy  Other Topics Concern   Not on file  Social History Narrative   Not on file   Social Drivers of Health   Financial Resource Strain: Low Risk  (02/15/2023)   Overall Financial Resource Strain (CARDIA)    Difficulty of Paying Living Expenses: Not very hard  Food Insecurity: Unknown (02/15/2023)   Hunger Vital Sign    Worried About Running Out of Food in the Last Year: Not on file    Ran Out of Food in the Last Year: Never true  Transportation Needs: No Transportation Needs (02/15/2023)   PRAPARE - Administrator, Civil Service  (Medical): No    Lack of Transportation (Non-Medical): No  Physical Activity: Unknown (02/15/2023)   Exercise Vital Sign    Days of Exercise per Week: 0 days    Minutes of Exercise per Session: Not on file  Stress: No Stress Concern Present (02/15/2023)   [employer] of Occupational Health - Occupational Stress Questionnaire    Feeling of Stress : Only a little  Social Connections: Moderately Isolated (02/15/2023)   Social Connection and Isolation Panel [NHANES]    Frequency of Communication with Friends and Family: More than three times a week    Frequency of Social Gatherings with Friends and Family: Once a week    Attends Religious Services: Never    Database administrator or Organizations: No    Attends Engineer, structural: Not on file    Marital Status: Married   Allergies  Allergen Reactions   Codeine Itching   Doxycycline Other (See Comments)    chest pain   Penicillins Nausea And Vomiting   Prednisone Anxiety    severe anxiety   Family History  Problem Relation Age of Onset   Cancer Mother 42       vulvar cancer   Lung cancer Mother 57   Stroke Father    Cancer - Lung Sister    Suicidality Sister    Suicidality Brother    Arthritis Brother    Prostate cancer Maternal Uncle    Prostate cancer Maternal Uncle    Stroke Maternal Grandmother    Prostate cancer Maternal Grandfather    Cervical cancer Paternal Grandmother    Throat cancer Paternal Grandfather    Colon cancer Neg Hx    Rectal cancer Neg Hx    Stomach cancer Neg Hx     Current Outpatient Medications (Endocrine & Metabolic):    levothyroxine (SYNTHROID) 112 MCG tablet, Take 1 tablet (112 mcg total) by mouth daily.  Current Outpatient Medications (Cardiovascular):    amLODipine (NORVASC) 5 MG tablet, TAKE 1 TABLET BY MOUTH DAILY   losartan (COZAAR) 50 MG tablet, TAKE 1 TABLET BY MOUTH DAILY  Current Outpatient Medications (Respiratory):    diphenhydrAMINE (BENADRYL) 25 MG tablet, Take  50 mg by mouth at bedtime as needed for sleep.  Current Outpatient Medications (Analgesics):    meloxicam (MOBIC) 15 MG tablet, Take 1 tablet (15 mg total) by mouth daily. Annual appt due in Sept must see provider for future refills   Current Outpatient Medications (Other):    DULoxetine (CYMBALTA) 30 MG capsule, TAKE 1 CAPSULE BY MOUTH DAILY   omeprazole (PRILOSEC) 20 MG capsule, TAKE 1 CAPSULE BY MOUTH DAILY   tiZANidine (ZANAFLEX) 4 MG tablet, TAKE 1 TABLET BY MOUTH EVERY 6 HOURS AS NEEDED FOR MUSCLE SPASM   gabapentin (NEURONTIN) 100 MG capsule, Take 1 capsule (100 mg total)  by mouth 2 (two) times daily as needed (nerve pain).   gabapentin (NEURONTIN) 300 MG capsule, Take 1 capsule (300 mg total) by mouth at bedtime.   tiZANidine (ZANAFLEX) 4 MG tablet, Take 1 tablet (4 mg total) by mouth at bedtime.   Reviewed prior external information including notes and imaging from  primary care provider As well as notes that were available from care everywhere and other healthcare systems.  Past medical history, social, surgical and family history all reviewed in electronic medical record.  No pertanent information unless stated regarding to the chief complaint.   Review of Systems:  No headache, visual changes, nausea, vomiting, diarrhea, constipation, dizziness, abdominal pain, skin rash, fevers, chills, night sweats, weight loss, swollen lymph nodes, body aches, joint swelling, chest pain, shortness of breath, mood changes. POSITIVE muscle aches  Objective  Blood pressure 128/88, pulse 76, height 5\' 1"  (1.549 m), weight 203 lb (92.1 kg), SpO2 98%.   General: No apparent distress alert and oriented x3 mood and affect normal, dressed appropriately.  HEENT: Pupils equal, extraocular movements intact  Respiratory: Patient's speak in full sentences and does not appear short of breath  Cardiovascular: No lower extremity edema, non tender, no erythema  Shoulder exam shows patient does have  positive impingement noted with Neer and Hawkins.  Patient does have some weakness with 4 out of 5 strength of the rotator cuff.  Positive crossover sign noted.  Right hip exam shows severe tenderness to palpation over the greater trochanteric area.   Procedure: Real-time Ultrasound Guided Injection of right glenohumeral joint Device: GE Logiq Q7  Ultrasound guided injection is preferred based studies that show increased duration, increased effect, greater accuracy, decreased procedural pain, increased response rate with ultrasound guided versus blind injection.  Verbal informed consent obtained.  Time-out conducted.  Noted no overlying erythema, induration, or other signs of local infection.  Skin prepped in a sterile fashion.  Local anesthesia: Topical Ethyl chloride.  With sterile technique and under real time ultrasound guidance:  Joint visualized.  23g 1  inch needle inserted posterior approach. Pictures taken for needle placement. Patient did have injection 2 cc of 0.5% Marcaine, and 1.0 cc of Kenalog 40 mg/dL. Completed without difficulty  Pain immediately resolved suggesting accurate placement of the medication.  Advised to call if fevers/chills, erythema, induration, drainage, or persistent bleeding.  Impression: Technically successful ultrasound guided injection.    Procedure: Real-time Ultrasound Guided Injection of right greater trochanteric bursitis secondary to patient's body habitus Device: GE Logiq Q7 Ultrasound guided injection is preferred based studies that show increased duration, increased effect, greater accuracy, decreased procedural pain, increased response rate, and decreased cost with ultrasound guided versus blind injection.  Verbal informed consent obtained.  Time-out conducted.  Noted no overlying erythema, induration, or other signs of local infection.  Skin prepped in a sterile fashion.  Local anesthesia: Topical Ethyl chloride.  With sterile technique and  under real time ultrasound guidance:  Greater trochanteric area was visualized and patient's bursa was noted. A 22-gauge 3 inch needle was inserted and 4 cc of 0.5% Marcaine and 1 cc of Kenalog 40 mg/dL was injected. Pictures taken Completed without difficulty  Pain immediately resolved suggesting accurate placement of the medication.  Advised to call if fevers/chills, erythema, induration, drainage, or persistent bleeding.  Images permanently stored and available for review in the ultrasound unit.  Impression: Technically successful ultrasound guided injection.    Impression and Recommendations:    The above documentation has been reviewed and  is accurate and complete Judi Saa, DO

## 2023-07-18 ENCOUNTER — Other Ambulatory Visit: Payer: Self-pay

## 2023-07-18 ENCOUNTER — Ambulatory Visit: Payer: BC Managed Care – PPO | Admitting: Family Medicine

## 2023-07-18 VITALS — BP 128/88 | HR 76 | Ht 61.0 in | Wt 203.0 lb

## 2023-07-18 DIAGNOSIS — M25551 Pain in right hip: Secondary | ICD-10-CM

## 2023-07-18 DIAGNOSIS — M7551 Bursitis of right shoulder: Secondary | ICD-10-CM | POA: Diagnosis not present

## 2023-07-18 DIAGNOSIS — E538 Deficiency of other specified B group vitamins: Secondary | ICD-10-CM | POA: Diagnosis not present

## 2023-07-18 DIAGNOSIS — M7061 Trochanteric bursitis, right hip: Secondary | ICD-10-CM

## 2023-07-18 MED ORDER — TIZANIDINE HCL 4 MG PO TABS: 4.0000 mg | ORAL_TABLET | Freq: Every day | ORAL | 0 refills | Status: DC

## 2023-07-18 MED ORDER — MELOXICAM 15 MG PO TABS: 15.0000 mg | ORAL_TABLET | Freq: Every day | ORAL | 2 refills | Status: DC

## 2023-07-18 MED ORDER — GABAPENTIN 300 MG PO CAPS: 300.0000 mg | ORAL_CAPSULE | Freq: Every day | ORAL | 0 refills | Status: DC

## 2023-07-18 MED ORDER — GABAPENTIN 100 MG PO CAPS: 100.0000 mg | ORAL_CAPSULE | Freq: Two times a day (BID) | ORAL | 3 refills | Status: DC | PRN

## 2023-07-18 MED ORDER — CYANOCOBALAMIN 1000 MCG/ML IJ SOLN
1000.0000 ug | Freq: Once | INTRAMUSCULAR | Status: AC
  Administered 2023-07-18: 1000 ug via INTRAMUSCULAR

## 2023-07-18 NOTE — Patient Instructions (Signed)
[employer] See me in 3 months

## 2023-07-19 ENCOUNTER — Encounter: Payer: Self-pay | Admitting: Family Medicine

## 2023-07-19 DIAGNOSIS — E538 Deficiency of other specified B group vitamins: Secondary | ICD-10-CM | POA: Insufficient documentation

## 2023-07-19 NOTE — Assessment & Plan Note (Addendum)
B12 injection given today and tolerated the procedure well, d discussed oral supplementation

## 2023-07-19 NOTE — Assessment & Plan Note (Signed)
Injection given today and tolerated the procedure well, discussed icing regimen and home exercises, discussed avoiding certain activities.  Increase activity slowly.  Follow-up again in 6 to 8 weeks

## 2023-07-19 NOTE — Assessment & Plan Note (Signed)
Chronic problem with exacerbation again.  Concern for some low-lying possibility of a rotator cuff tear noted as well.  Discussed icing regimen and home exercises, discussed avoiding certain activities.  Discussed increasing activity as tolerated.  Follow-up again in 6 to 8 weeks.

## 2023-08-10 ENCOUNTER — Other Ambulatory Visit: Payer: Self-pay | Admitting: Family Medicine

## 2023-08-11 MED ORDER — GABAPENTIN 300 MG PO CAPS: 300.0000 mg | ORAL_CAPSULE | Freq: Every day | ORAL | 0 refills | Status: DC

## 2023-08-13 ENCOUNTER — Other Ambulatory Visit: Payer: Self-pay | Admitting: Internal Medicine

## 2023-10-11 ENCOUNTER — Other Ambulatory Visit: Payer: Self-pay | Admitting: Family Medicine

## 2023-10-18 ENCOUNTER — Other Ambulatory Visit: Payer: Self-pay | Admitting: Family Medicine

## 2023-11-01 DIAGNOSIS — E039 Hypothyroidism, unspecified: Secondary | ICD-10-CM | POA: Diagnosis not present

## 2023-11-01 DIAGNOSIS — Z1231 Encounter for screening mammogram for malignant neoplasm of breast: Secondary | ICD-10-CM | POA: Diagnosis not present

## 2023-11-01 DIAGNOSIS — Z7689 Persons encountering health services in other specified circumstances: Secondary | ICD-10-CM | POA: Diagnosis not present

## 2023-11-01 DIAGNOSIS — Z13820 Encounter for screening for osteoporosis: Secondary | ICD-10-CM | POA: Diagnosis not present

## 2023-11-06 NOTE — Progress Notes (Unsigned)
 [facility] [address] [place] [address] Phone: [phone] Subjective:   Alyssa [name], am serving as a scribe for Dr. Ronnell Coins. I'm seeing this patient by the request  of:  Colene Dauphin, MD  CC: Multiple joint complaints  [id number]  07/18/2023 B12 injection given today and tolerated the procedure well, d discussed oral supplementation     Injection given today and tolerated the procedure well, discussed icing regimen and home exercises, discussed avoiding certain activities.  Increase activity slowly.  Follow-up again in 6 to 8 weeks      Update 11/07/2023 Alyssa [name] is a 63 y.o. female coming in with complaint of L hip. Patient states her pain is worsening over piriformis especially when getting in and out of car and sit to stand. Typically seen for R hip.   R anteiror shoulder pain at night. Surgery on shoulder May 2024.   B hand pain. Tingling throughout wrist and palm into her fingers. Painful to make a fist. R hand is just painful throughout the joints.     Would like B12 injection.  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   Pre-diabetes    SVD (spontaneous vaginal delivery)    x 3   Past Surgical History:  Procedure Laterality Date   ABDOMINAL HYSTERECTOMY     APPENDECTOMY     with hysterectomy   ENDOMETRIAL ABLATION  2002   LAPAROSCOPIC CHOLECYSTECTOMY  12/09/2016   THYROID  LOBECTOMY N/A 01/22/2022   Procedure: THYROID  ISTHMUSECTOMY WITH RESECTION OF SUBSTERNAL MASS;  Surgeon: Oralee Billow, MD;  Location: [facility];  Service: General;  Laterality: N/A;   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   Procedure: LEFT TOTAL KNEE ARTHROPLASTY;  Surgeon: Dayne Even, MD;  Location: [facility];  Service: Orthopedics;  Laterality: Left;   TUBAL LIGATION  1991    WISDOM TOOTH EXTRACTION     Social History   Socioeconomic History   Marital status: Married    Spouse name: Not on file   Number of children: 3   Years of education: Not on file   Highest education level: Associate degree: occupational, Scientist, product/process development, or vocational program  Occupational History   Not on file  Tobacco Use   Smoking status: Never   Smokeless tobacco: Never  Vaping Use   Vaping status: Never Used  Substance and Sexual Activity   Alcohol use: No   Drug use: No   Sexual activity: Yes    Birth control/protection: Post-menopausal    Comment: Hysterctomy  Other Topics Concern   Not on file  Social History Narrative   Not on file   Social Drivers of Health   Financial Resource Strain: Low Risk  (02/15/2023)   Overall Financial Resource Strain (CARDIA)    Difficulty of Paying Living Expenses: Not very hard  Food Insecurity: Low Risk  (11/01/2023)   Received from [facility]   Hunger Vital Sign    Worried About Running Out of Food in the Last Year: Never true    Ran Out of Food in the Last Year: Never true  Transportation Needs: No Transportation Needs (11/01/2023)   Received from [facility]    In the past 12 months, has lack of reliable transportation kept you from medical  appointments, meetings, work or from getting things needed for daily living? : No  Physical Activity: Unknown (02/15/2023)   Exercise Vital Sign    Days of Exercise per Week: 0 days    Minutes of Exercise per Session: Not on file  Stress: No Stress Concern Present (02/15/2023)   [employer] of Occupational Health - Occupational Stress Questionnaire    Feeling of Stress : Only a little  Social Connections: Moderately Isolated (02/15/2023)   Social Connection and Isolation Panel [NHANES]    Frequency of Communication with Friends and Family: More than three times a week    Frequency of Social Gatherings with Friends and Family: Once a week    Attends Religious Services:  Never    Database administrator or Organizations: No    Attends Engineer, structural: Not on file    Marital Status: Married   Allergies  Allergen Reactions   Codeine Itching   Doxycycline Other (See Comments)    chest pain   Penicillins Nausea And Vomiting   Prednisone  Anxiety    severe anxiety   Family History  Problem Relation Age of Onset   Cancer Mother 63       vulvar cancer   Lung cancer Mother 73   Stroke Father    Cancer - Lung Sister    Suicidality Sister    Suicidality Brother    Arthritis Brother    Prostate cancer Maternal Uncle    Prostate cancer Maternal Uncle    Stroke Maternal Grandmother    Prostate cancer Maternal Grandfather    Cervical cancer Paternal Grandmother    Throat cancer Paternal Grandfather    Colon cancer Neg Hx    Rectal cancer Neg Hx    Stomach cancer Neg Hx     Current Outpatient Medications (Endocrine & Metabolic):    levothyroxine  (SYNTHROID ) 112 MCG tablet, Take 1 tablet (112 mcg total) by mouth daily.  Current Outpatient Medications (Cardiovascular):    amLODipine  (NORVASC ) 5 MG tablet, TAKE 1 TABLET BY MOUTH DAILY   losartan  (COZAAR ) 50 MG tablet, TAKE 1 TABLET BY MOUTH DAILY  Current Outpatient Medications (Respiratory):    diphenhydrAMINE  (BENADRYL ) 25 MG tablet, Take 50 mg by mouth at bedtime as needed for sleep.  Current Outpatient Medications (Analgesics):    meloxicam  (MOBIC ) 15 MG tablet, TAKE 1 TABLET BY MOUTH ONCE DAILY, ANNUAL APPOINTMENT DUE IN [place]   Current Outpatient Medications (Other):    DULoxetine  (CYMBALTA ) 30 MG capsule, TAKE 1 CAPSULE BY MOUTH DAILY   gabapentin  (NEURONTIN ) 100 MG capsule, Take 1 capsule (100 mg total) by mouth 2 (two) times daily as needed (nerve pain).   gabapentin  (NEURONTIN ) 300 MG capsule, Take 1 capsule (300 mg total) by mouth at bedtime.   omeprazole  (PRILOSEC) 20 MG capsule, TAKE 1 CAPSULE BY MOUTH DAILY   tiZANidine  (ZANAFLEX ) 4 MG tablet, TAKE 1 TABLET BY MOUTH  EVERY 6 HOURS AS NEEDED FOR MUSCLE SPASM   tiZANidine  (ZANAFLEX ) 4 MG tablet, TAKE 1 TABLET BY MOUTH AT BEDTIME   Vitamin D , Ergocalciferol , (DRISDOL ) 1.25 MG (50000 UNIT) CAPS capsule, Take 1 capsule (50,000 Units total) by mouth every 7 (seven) days.   Reviewed prior external information including notes and imaging from  primary care provider As well as notes that were available from care everywhere and other healthcare systems.  Past medical history, social, surgical and family history all reviewed in electronic medical record.  No pertanent information unless stated regarding to the chief complaint.  Review of Systems:  No headache, visual changes, nausea, vomiting, diarrhea, constipation, dizziness, abdominal pain, skin rash, fevers, chills, night sweats, weight loss, swollen lymph nodes, body aches, joint swelling, chest pain, shortness of breath, mood changes. POSITIVE muscle aches, joint swelling  Objective  Blood pressure 108/78, pulse 88, height 5\' 1"  (1.549 m), SpO2 98%.   General: No apparent distress alert and oriented x3 mood and affect normal, dressed appropriately.  HEENT: Pupils equal, extraocular movements intact  Respiratory: Patient's speak in full sentences and does not appear short of breath  Cardiovascular: No lower extremity edema, non tender, no erythema   Patient's left knee does have some instability noted.  This is a replacement.  Does have effusion noted.  Procedure: Real-time Ultrasound Guided Injection of right glenohumeral joint Device: GE Logiq Q7  Ultrasound guided injection is preferred based studies that show increased duration, increased effect, greater accuracy, decreased procedural pain, increased response rate with ultrasound guided versus blind injection.  Verbal informed consent obtained.  Time-out conducted.  Noted no overlying erythema, induration, or other signs of local infection.  Skin prepped in a sterile fashion.  Local anesthesia:  Topical Ethyl chloride.  With sterile technique and under real time ultrasound guidance:  Joint visualized.  23g 1  inch needle inserted posterior approach. Pictures taken for needle placement. Patient did have injection of , 2 cc of 0.5% Marcaine , and 1.0 cc of Kenalog  40 mg/dL. Completed without difficulty  Pain immediately resolved suggesting accurate placement of the medication.  Advised to call if fevers/chills, erythema, induration, drainage, or persistent bleeding.  Images saved Impression: Technically successful ultrasound guided injection.   Procedure: Real-time Ultrasound Guided Injection of left  greater trochanteric bursitis secondary to patient's body habitus Device: GE Logiq Q7  Ultrasound guided injection is preferred based studies that show increased duration, increased effect, greater accuracy, decreased procedural pain, increased response rate, and decreased cost with ultrasound guided versus blind injection.  Verbal informed consent obtained.  Time-out conducted.  Noted no overlying erythema, induration, or other signs of local infection.  Skin prepped in a sterile fashion.  Local anesthesia: Topical Ethyl chloride.  With sterile technique and under real time ultrasound guidance:  Greater trochanteric area was visualized and patient's bursa was noted. A 22-gauge 3 inch needle was inserted and 4 cc of 0.5% Marcaine  and 1 cc of Kenalog  40 mg/dL was injected. Pictures taken Completed without difficulty  Pain immediately resolved suggesting accurate placement of the medication.  Advised to call if fevers/chills, erythema, induration, drainage, or persistent bleeding.  Images permanently stored  Impression: Technically successful ultrasound guided injection.   Impression and Recommendations:     The above documentation has been reviewed and is accurate and complete Shianna Bally M Jonee Lamore, DO

## 2023-11-07 ENCOUNTER — Other Ambulatory Visit: Payer: Self-pay

## 2023-11-07 ENCOUNTER — Encounter: Payer: Self-pay | Admitting: Family Medicine

## 2023-11-07 ENCOUNTER — Ambulatory Visit: Admitting: Family Medicine

## 2023-11-07 VITALS — BP 108/78 | HR 88 | Ht 61.0 in

## 2023-11-07 DIAGNOSIS — Z0000 Encounter for general adult medical examination without abnormal findings: Secondary | ICD-10-CM | POA: Diagnosis not present

## 2023-11-07 DIAGNOSIS — M7551 Bursitis of right shoulder: Secondary | ICD-10-CM | POA: Diagnosis not present

## 2023-11-07 DIAGNOSIS — Z131 Encounter for screening for diabetes mellitus: Secondary | ICD-10-CM | POA: Diagnosis not present

## 2023-11-07 DIAGNOSIS — E538 Deficiency of other specified B group vitamins: Secondary | ICD-10-CM | POA: Diagnosis not present

## 2023-11-07 DIAGNOSIS — M25551 Pain in right hip: Secondary | ICD-10-CM

## 2023-11-07 DIAGNOSIS — M5416 Radiculopathy, lumbar region: Secondary | ICD-10-CM

## 2023-11-07 DIAGNOSIS — M7062 Trochanteric bursitis, left hip: Secondary | ICD-10-CM

## 2023-11-07 DIAGNOSIS — M25562 Pain in left knee: Secondary | ICD-10-CM | POA: Diagnosis not present

## 2023-11-07 DIAGNOSIS — E039 Hypothyroidism, unspecified: Secondary | ICD-10-CM | POA: Diagnosis not present

## 2023-11-07 DIAGNOSIS — Z13220 Encounter for screening for lipoid disorders: Secondary | ICD-10-CM | POA: Diagnosis not present

## 2023-11-07 DIAGNOSIS — Z1329 Encounter for screening for other suspected endocrine disorder: Secondary | ICD-10-CM | POA: Diagnosis not present

## 2023-11-07 DIAGNOSIS — Z96652 Presence of left artificial knee joint: Secondary | ICD-10-CM

## 2023-11-07 DIAGNOSIS — G8929 Other chronic pain: Secondary | ICD-10-CM

## 2023-11-07 DIAGNOSIS — M25531 Pain in right wrist: Secondary | ICD-10-CM

## 2023-11-07 MED ORDER — CYANOCOBALAMIN 1000 MCG/ML IJ SOLN
1000.0000 ug | Freq: Once | INTRAMUSCULAR | Status: AC
  Administered 2023-11-07: 1000 ug via INTRAMUSCULAR

## 2023-11-07 MED ORDER — VITAMIN D (ERGOCALCIFEROL) 1.25 MG (50000 UNIT) PO CAPS: 50000.0000 [IU] | ORAL_CAPSULE | ORAL | 0 refills | Status: DC

## 2023-11-07 NOTE — Patient Instructions (Addendum)
 B12 injection today Vit D Once Weekly R wrist brace Bone scan for L knee  L4/L5 epidural If better you can cancel Injection in L GT and R shoulder See me again in 2-3 months

## 2023-11-07 NOTE — Assessment & Plan Note (Signed)
 Recent sprain with putting on her shoe.  A removable brace given.  Likely 2 weeks to fully heal.

## 2023-11-07 NOTE — Assessment & Plan Note (Signed)
 Chronic problem with exacerbation.  Does have some mild rotator cuff arthropathy.  Still responding extremely well though also to the home exercises and injections when needed.  No significant increase in weakness noted today.  Follow-up again in 6 to 8 weeks otherwise

## 2023-11-07 NOTE — Assessment & Plan Note (Signed)
 Patient given injection and tolerated the procedure well, discussed icing regimen and home exercises.  Was able to ambulate better.  Do believe that some of this is secondary to her back ago and did get another epidural.  Has responded well to that in the past and last one was nearly 3 years ago.  Will monitor for any weakness of the lower extremities.  Follow-up again in 6 weeks after the back injection.

## 2023-11-07 NOTE — Assessment & Plan Note (Signed)
 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  Will get a bone scan to further evaluate for any type of loosening.

## 2023-11-07 NOTE — Assessment & Plan Note (Signed)
 B12 deficiency and B12 injection given today.  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

## 2023-11-08 ENCOUNTER — Telehealth: Payer: Self-pay | Admitting: Family Medicine

## 2023-11-08 NOTE — Telephone Encounter (Signed)
 Alyssa [name] from [employer] cross called regarding patient. She states that [place] submitted a pre authorization request member form for this patient and she was calling to provide the information that pre authorization is not required for code [id number]. If you have any questions you can call the provider line at [phone].

## 2023-11-11 NOTE — Telephone Encounter (Signed)
Noted added to referral.

## 2023-11-15 ENCOUNTER — Other Ambulatory Visit: Payer: Self-pay | Admitting: Internal Medicine

## 2023-11-16 ENCOUNTER — Other Ambulatory Visit: Payer: Self-pay | Admitting: Family Medicine

## 2023-11-21 ENCOUNTER — Encounter: Payer: Self-pay | Admitting: Family Medicine

## 2023-11-27 NOTE — Discharge Instructions (Signed)

## 2023-11-28 ENCOUNTER — Ambulatory Visit
Admission: RE | Admit: 2023-11-28 | Discharge: 2023-11-28 | Disposition: A | Discharge status: Home / Self Care | Source: Ambulatory Visit | Attending: Family Medicine | Admitting: Family Medicine

## 2023-11-28 DIAGNOSIS — M5416 Radiculopathy, lumbar region: Secondary | ICD-10-CM

## 2023-11-28 DIAGNOSIS — M5116 Intervertebral disc disorders with radiculopathy, lumbar region: Secondary | ICD-10-CM | POA: Diagnosis not present

## 2023-11-28 MED ORDER — IOPAMIDOL (ISOVUE-M 200) INJECTION 41%
1.0000 mL | Freq: Once | INTRAMUSCULAR | Status: AC
  Administered 2023-11-28: 1 mL via EPIDURAL

## 2023-11-28 MED ORDER — METHYLPREDNISOLONE ACETATE 40 MG/ML INJ SUSP (RADIOLOG
80.0000 mg | Freq: Once | INTRAMUSCULAR | Status: AC
  Administered 2023-11-28: 80 mg via EPIDURAL

## 2023-11-29 ENCOUNTER — Ambulatory Visit: Admitting: Family Medicine

## 2023-12-20 ENCOUNTER — Encounter (HOSPITAL_COMMUNITY)
Admission: RE | Admit: 2023-12-20 | Discharge: 2023-12-20 | Disposition: A | Discharge status: Home / Self Care | Source: Ambulatory Visit | Attending: Family Medicine | Admitting: Family Medicine

## 2023-12-20 DIAGNOSIS — G8929 Other chronic pain: Secondary | ICD-10-CM

## 2023-12-20 DIAGNOSIS — M25562 Pain in left knee: Secondary | ICD-10-CM | POA: Diagnosis not present

## 2023-12-20 DIAGNOSIS — Z96652 Presence of left artificial knee joint: Secondary | ICD-10-CM | POA: Diagnosis not present

## 2023-12-20 DIAGNOSIS — Z471 Aftercare following joint replacement surgery: Secondary | ICD-10-CM | POA: Diagnosis not present

## 2023-12-20 MED ORDER — TECHNETIUM TC 99M MEDRONATE IV KIT
20.4000 | PACK | Freq: Once | INTRAVENOUS | Status: AC | PRN
  Administered 2023-12-20: 20.4 via INTRAVENOUS

## 2023-12-22 ENCOUNTER — Other Ambulatory Visit: Payer: Self-pay | Admitting: Family Medicine

## 2023-12-25 ENCOUNTER — Ambulatory Visit: Payer: Self-pay | Admitting: Family Medicine

## 2023-12-31 ENCOUNTER — Other Ambulatory Visit: Payer: Self-pay | Admitting: Family Medicine

## 2024-01-07 NOTE — Progress Notes (Deleted)
 Alyssa [name] J[facility]I [name] [address] [place] [address] Phone: [phone] Subjective:    I'm seeing this patient by the request  of:  Pcp, No  CC: right shoulder and right wrist and...  YEP:Dlagzrupcz  11/07/2023 Instability and somewhat Nu-Knit noted of the knee.  Will get a bone scan to further evaluate for any type of loosening.     Chronic problem with exacerbation. Does have some mild rotator cuff arthropathy. Still responding extremely well though also to the home exercises and injections when needed. No significant increase in weakness noted today. Follow-up again in 6 to 8 weeks otherwise   B12 deficiency and B12 injection given today.  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  Was able to ambulate better.  Do believe that some of this is secondary to her back ago and did get another epidural.  Has responded well to that in the past and last one was nearly 3 years ago.  Will monitor for any weakness of the lower extremities.  Follow-up again in 6 weeks after the back injection.     Update 01/08/2024 Alyssa [name] is a 63 y.o. female coming in with complaint of R shoulder, R wrist, L hip and L knee pain. Epidural 11/28/2023. 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   Pre-diabetes    SVD (spontaneous vaginal delivery)    x 3   Past Surgical History:  Procedure Laterality Date   ABDOMINAL HYSTERECTOMY     APPENDECTOMY     with hysterectomy   ENDOMETRIAL ABLATION  2002   LAPAROSCOPIC CHOLECYSTECTOMY  12/09/2016   THYROID  LOBECTOMY N/A 01/22/2022    Procedure: THYROID  ISTHMUSECTOMY WITH RESECTION OF SUBSTERNAL MASS;  Surgeon: Eletha Boas, MD;  Location: [facility];  Service: General;  Laterality: N/A;   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   Procedure: LEFT TOTAL KNEE ARTHROPLASTY;  Surgeon: Sheril Coy, MD;  Location: [facility];  Service: Orthopedics;  Laterality: Left;   TUBAL LIGATION  1991   WISDOM TOOTH EXTRACTION     Social History   Socioeconomic History   Marital status: Married    Spouse name: Not on file   Number of children: 3   Years of education: Not on file   Highest education level: Associate degree: occupational, Scientist, product/process development, or vocational program  Occupational History   Not on file  Tobacco Use   Smoking status: Never   Smokeless tobacco: Never  Vaping Use   Vaping status: Never Used  Substance and Sexual Activity   Alcohol use: No   Drug use: No   Sexual activity: Yes    Birth control/protection: Post-menopausal    Comment: Hysterctomy  Other Topics Concern   Not on file  Social History Narrative   Not on file   Social Drivers of Health   Financial Resource Strain: Low Risk  (02/15/2023)   Overall Financial Resource Strain (CARDIA)    Difficulty of Paying Living Expenses: Not very hard  Food Insecurity: Low Risk  (11/07/2023)   Received from [facility]   Hunger Vital Sign  Within the past 12 months, you worried that your food would run out before you got money to buy more: Never true    Within the past 12 months, the food you bought just didn't last and you didn't have money to get more. : Never true  Transportation Needs: No Transportation Needs (11/07/2023)   Received from [facility]    In the past 12 months, has lack of reliable transportation kept you from medical appointments, meetings, work or from getting things needed for daily living? : No  Physical Activity: Unknown (02/15/2023)   Exercise Vital Sign    Days of Exercise per Week: 0 days    Minutes of Exercise per  Session: Not on file  Stress: No Stress Concern Present (02/15/2023)   [employer] of Occupational Health - Occupational Stress Questionnaire    Feeling of Stress : Only a little  Social Connections: Moderately Isolated (02/15/2023)   Social Connection and Isolation Panel    Frequency of Communication with Friends and Family: More than three times a week    Frequency of Social Gatherings with Friends and Family: Once a week    Attends Religious Services: Never    Database administrator or Organizations: No    Attends Engineer, structural: Not on file    Marital Status: Married   Allergies  Allergen Reactions   Codeine Itching   Doxycycline Other (See Comments)    chest pain   Penicillins Nausea And Vomiting   Prednisone  Anxiety    severe anxiety   Family History  Problem Relation Age of Onset   Cancer Mother 58       vulvar cancer   Lung cancer Mother 75   Stroke Father    Cancer - Lung Sister    Suicidality Sister    Suicidality Brother    Arthritis Brother    Prostate cancer Maternal Uncle    Prostate cancer Maternal Uncle    Stroke Maternal Grandmother    Prostate cancer Maternal Grandfather    Cervical cancer Paternal Grandmother    Throat cancer Paternal Grandfather    Colon cancer Neg Hx    Rectal cancer Neg Hx    Stomach cancer Neg Hx     Current Outpatient Medications (Endocrine & Metabolic):    levothyroxine  (SYNTHROID ) 112 MCG tablet, Take 1 tablet (112 mcg total) by mouth daily.  Current Outpatient Medications (Cardiovascular):    amLODipine  (NORVASC ) 5 MG tablet, TAKE 1 TABLET BY MOUTH DAILY   losartan  (COZAAR ) 50 MG tablet, TAKE 1 TABLET BY MOUTH DAILY  Current Outpatient Medications (Respiratory):    diphenhydrAMINE  (BENADRYL ) 25 MG tablet, Take 50 mg by mouth at bedtime as needed for sleep.  Current Outpatient Medications (Analgesics):    meloxicam  (MOBIC ) 15 MG tablet, TAKE 1 TABLET BY MOUTH ONCE DAILY **ANNUAL  APPOINTMENT  DUE  IN   [place]**   Current Outpatient Medications (Other):    DULoxetine  (CYMBALTA ) 30 MG capsule, TAKE 1 CAPSULE BY MOUTH DAILY   gabapentin  (NEURONTIN ) 100 MG capsule, Take 1 capsule (100 mg total) by mouth 2 (two) times daily as needed (nerve pain).   gabapentin  (NEURONTIN ) 300 MG capsule, Take 1 capsule (300 mg total) by mouth at bedtime.   omeprazole  (PRILOSEC) 20 MG capsule, TAKE 1 CAPSULE BY MOUTH DAILY   tiZANidine  (ZANAFLEX ) 4 MG tablet, TAKE 1 TABLET BY MOUTH EVERY 6 HOURS AS NEEDED FOR MUSCLE SPASM   tiZANidine  (ZANAFLEX ) 4 MG tablet, TAKE 1 TABLET  BY MOUTH AT BEDTIME   Vitamin D , Ergocalciferol , (DRISDOL ) 1.25 MG (50000 UNIT) CAPS capsule, Take 1 capsule (50,000 Units total) by mouth every 7 (seven) days.   Reviewed prior external information including notes and imaging from  primary care provider As well as notes that were available from care everywhere and other healthcare systems.  Past medical history, social, surgical and family history all reviewed in electronic medical record.  No pertanent information unless stated regarding to the chief complaint.   Review of Systems:  No headache, visual changes, nausea, vomiting, diarrhea, constipation, dizziness, abdominal pain, skin rash, fevers, chills, night sweats, weight loss, swollen lymph nodes, body aches, joint swelling, chest pain, shortness of breath, mood changes. POSITIVE muscle aches  Objective  There were no vitals taken for this visit.   General: No apparent distress alert and oriented x3 mood and affect normal, dressed appropriately.  HEENT: Pupils equal, extraocular movements intact  Respiratory: Patient's speak in full sentences and does not appear short of breath  Cardiovascular: No lower extremity edema, non tender, no erythema      Impression and Recommendations:    The above documentation has been reviewed and is accurate and complete Chinaza Rooke M Markeem Noreen, DO

## 2024-01-08 ENCOUNTER — Ambulatory Visit: Admitting: Family Medicine

## 2024-01-13 NOTE — Progress Notes (Deleted)
 [name] [address] [place] [address] Phone: [phone] Subjective:    I'm seeing this patient by the request  of:  Pcp, No  CC:   YEP:Dlagzrupcz  11/07/2023 Recent sprain with putting on her shoe.  A removable brace given.  Likely 2 weeks to fully heal.     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  Will get a bone scan to further evaluate for any type of loosening.    hronic problem with exacerbation. Does have some mild rotator cuff arthropathy. Still responding extremely well though also to the home exercises and injections when needed. No significant increase in weakness noted today. Follow-up again in 6 to 8 weeks otherwise  B12 deficiency and B12 injection given today.  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  Was able to ambulate better.  Do believe that some of this is secondary to her back ago and did get another epidural.  Has responded well to that in the past and last one was nearly 3 years ago.  Will monitor for any weakness of the lower extremities.  Follow-up again in 6 weeks after the back injection.     Updated 01/16/2024 Alyssa [name] is a 63 y.o. 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   Pre-diabetes    SVD (spontaneous vaginal delivery)    x 3   Past Surgical History:  Procedure Laterality Date   ABDOMINAL HYSTERECTOMY     APPENDECTOMY     with hysterectomy   ENDOMETRIAL ABLATION  2002   LAPAROSCOPIC CHOLECYSTECTOMY  12/09/2016   THYROID  LOBECTOMY N/A 01/22/2022   Procedure: THYROID  ISTHMUSECTOMY WITH  RESECTION OF SUBSTERNAL MASS;  Surgeon: Eletha Boas, MD;  Location: [facility];  Service: General;  Laterality: N/A;   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   Procedure: LEFT TOTAL KNEE ARTHROPLASTY;  Surgeon: Sheril Coy, MD;  Location: [facility];  Service: Orthopedics;  Laterality: Left;   TUBAL LIGATION  1991   WISDOM TOOTH EXTRACTION     Social History   Socioeconomic History   Marital status: Married    Spouse name: Not on file   Number of children: 3   Years of education: Not on file   Highest education level: Associate degree: occupational, Scientist, product/process development, or vocational program  Occupational History   Not on file  Tobacco Use   Smoking status: Never   Smokeless tobacco: Never  Vaping Use   Vaping status: Never Used  Substance and Sexual Activity   Alcohol use: No   Drug use: No   Sexual activity: Yes    Birth control/protection: Post-menopausal    Comment: Hysterctomy  Other Topics Concern   Not on file  Social History Narrative   Not on file   Social Drivers of Health   Financial Resource Strain: Low Risk  (02/15/2023)   Overall Financial Resource Strain (CARDIA)    Difficulty of Paying Living Expenses: Not very hard  Food Insecurity: Low Risk  (11/07/2023)   Received from [facility]   Hunger Vital Sign  Within the past 12 months, you worried that your food would run out before you got money to buy more: Never true    Within the past 12 months, the food you bought just didn't last and you didn't have money to get more. : Never true  Transportation Needs: No Transportation Needs (11/07/2023)   Received from [facility]    In the past 12 months, has lack of reliable transportation kept you from medical appointments, meetings, work or from getting things needed for daily living? : No  Physical Activity: Unknown (02/15/2023)   Exercise Vital Sign    Days of Exercise per Week: 0 days    Minutes of Exercise per Session: Not on file  Stress: No Stress  Concern Present (02/15/2023)   [employer] of Occupational Health - Occupational Stress Questionnaire    Feeling of Stress : Only a little  Social Connections: Moderately Isolated (02/15/2023)   Social Connection and Isolation Panel    Frequency of Communication with Friends and Family: More than three times a week    Frequency of Social Gatherings with Friends and Family: Once a week    Attends Religious Services: Never    Database administrator or Organizations: No    Attends Engineer, structural: Not on file    Marital Status: Married   Allergies  Allergen Reactions   Codeine Itching   Doxycycline Other (See Comments)    chest pain   Penicillins Nausea And Vomiting   Prednisone  Anxiety    severe anxiety   Family History  Problem Relation Age of Onset   Cancer Mother 61       vulvar cancer   Lung cancer Mother 98   Stroke Father    Cancer - Lung Sister    Suicidality Sister    Suicidality Brother    Arthritis Brother    Prostate cancer Maternal Uncle    Prostate cancer Maternal Uncle    Stroke Maternal Grandmother    Prostate cancer Maternal Grandfather    Cervical cancer Paternal Grandmother    Throat cancer Paternal Grandfather    Colon cancer Neg Hx    Rectal cancer Neg Hx    Stomach cancer Neg Hx     Current Outpatient Medications (Endocrine & Metabolic):    levothyroxine  (SYNTHROID ) 112 MCG tablet, Take 1 tablet (112 mcg total) by mouth daily.  Current Outpatient Medications (Cardiovascular):    amLODipine  (NORVASC ) 5 MG tablet, TAKE 1 TABLET BY MOUTH DAILY   losartan  (COZAAR ) 50 MG tablet, TAKE 1 TABLET BY MOUTH DAILY  Current Outpatient Medications (Respiratory):    diphenhydrAMINE  (BENADRYL ) 25 MG tablet, Take 50 mg by mouth at bedtime as needed for sleep.  Current Outpatient Medications (Analgesics):    meloxicam  (MOBIC ) 15 MG tablet, TAKE 1 TABLET BY MOUTH ONCE DAILY **ANNUAL  APPOINTMENT  DUE  IN  [place]**   Current Outpatient  Medications (Other):    DULoxetine  (CYMBALTA ) 30 MG capsule, TAKE 1 CAPSULE BY MOUTH DAILY   gabapentin  (NEURONTIN ) 100 MG capsule, Take 1 capsule (100 mg total) by mouth 2 (two) times daily as needed (nerve pain).   gabapentin  (NEURONTIN ) 300 MG capsule, Take 1 capsule (300 mg total) by mouth at bedtime.   omeprazole  (PRILOSEC) 20 MG capsule, TAKE 1 CAPSULE BY MOUTH DAILY   tiZANidine  (ZANAFLEX ) 4 MG tablet, TAKE 1 TABLET BY MOUTH EVERY 6 HOURS AS NEEDED FOR MUSCLE SPASM   tiZANidine  (ZANAFLEX ) 4 MG tablet, TAKE 1 TABLET  BY MOUTH AT BEDTIME   Vitamin D , Ergocalciferol , (DRISDOL ) 1.25 MG (50000 UNIT) CAPS capsule, Take 1 capsule (50,000 Units total) by mouth every 7 (seven) days.   Reviewed prior external information including notes and imaging from  primary care provider As well as notes that were available from care everywhere and other healthcare systems.  Past medical history, social, surgical and family history all reviewed in electronic medical record.  No pertanent information unless stated regarding to the chief complaint.   Review of Systems:  No headache, visual changes, nausea, vomiting, diarrhea, constipation, dizziness, abdominal pain, skin rash, fevers, chills, night sweats, weight loss, swollen lymph nodes, body aches, joint swelling, chest pain, shortness of breath, mood changes. POSITIVE muscle aches  Objective  There were no vitals taken for this visit.   General: No apparent distress alert and oriented x3 mood and affect normal, dressed appropriately.  HEENT: Pupils equal, extraocular movements intact  Respiratory: Patient's speak in full sentences and does not appear short of breath  Cardiovascular: No lower extremity edema, non tender, no erythema      Impression and Recommendations:

## 2024-01-16 ENCOUNTER — Ambulatory Visit: Admitting: Family Medicine

## 2024-01-21 ENCOUNTER — Other Ambulatory Visit: Payer: Self-pay | Admitting: Internal Medicine

## 2024-01-23 ENCOUNTER — Other Ambulatory Visit: Payer: Self-pay | Admitting: Family Medicine

## 2024-02-11 ENCOUNTER — Encounter: Payer: Self-pay | Admitting: Family Medicine

## 2024-02-14 ENCOUNTER — Other Ambulatory Visit: Payer: Self-pay | Admitting: Family Medicine

## 2024-02-18 ENCOUNTER — Other Ambulatory Visit: Payer: Self-pay | Admitting: Internal Medicine

## 2024-02-18 DIAGNOSIS — N838 Other noninflammatory disorders of ovary, fallopian tube and broad ligament: Secondary | ICD-10-CM

## 2024-02-19 ENCOUNTER — Encounter: Payer: Self-pay | Admitting: Family Medicine

## 2024-02-19 ENCOUNTER — Ambulatory Visit (INDEPENDENT_AMBULATORY_CARE_PROVIDER_SITE_OTHER): Admitting: Family Medicine

## 2024-02-19 VITALS — BP 124/86 | HR 69 | Ht 61.0 in | Wt 201.0 lb

## 2024-02-19 DIAGNOSIS — M545 Low back pain, unspecified: Secondary | ICD-10-CM

## 2024-02-19 DIAGNOSIS — E041 Nontoxic single thyroid nodule: Secondary | ICD-10-CM

## 2024-02-19 DIAGNOSIS — E538 Deficiency of other specified B group vitamins: Secondary | ICD-10-CM

## 2024-02-19 DIAGNOSIS — E559 Vitamin D deficiency, unspecified: Secondary | ICD-10-CM

## 2024-02-19 LAB — CBC WITH DIFFERENTIAL/PLATELET
Basophils Absolute: 0 K/uL (ref 0.0–0.1)
Basophils Relative: 1.1 % (ref 0.0–3.0)
Eosinophils Absolute: 0.1 K/uL (ref 0.0–0.7)
Eosinophils Relative: 2.2 % (ref 0.0–5.0)
HCT: 42.8 % (ref 36.0–46.0)
Hemoglobin: 14.1 g/dL (ref 12.0–15.0)
Lymphocytes Relative: 19.5 % (ref 12.0–46.0)
Lymphs Abs: 0.8 K/uL (ref 0.7–4.0)
MCHC: 33 g/dL (ref 30.0–36.0)
MCV: 91.6 fl (ref 78.0–100.0)
Monocytes Absolute: 0.3 K/uL (ref 0.1–1.0)
Monocytes Relative: 6.9 % (ref 3.0–12.0)
Neutro Abs: 2.9 K/uL (ref 1.4–7.7)
Neutrophils Relative %: 70.3 % (ref 43.0–77.0)
Platelets: 245 K/uL (ref 150.0–400.0)
RBC: 4.68 Mil/uL (ref 3.87–5.11)
RDW: 13.7 % (ref 11.5–15.5)
WBC: 4.2 K/uL (ref 4.0–10.5)

## 2024-02-19 LAB — COMPREHENSIVE METABOLIC PANEL WITH GFR
ALT: 24 U/L (ref 0–35)
AST: 29 U/L (ref 0–37)
Albumin: 4.6 g/dL (ref 3.5–5.2)
Alkaline Phosphatase: 93 U/L (ref 39–117)
BUN: 20 mg/dL (ref 6–23)
CO2: 29 meq/L (ref 19–32)
Calcium: 9.3 mg/dL (ref 8.4–10.5)
Chloride: 102 meq/L (ref 96–112)
Creatinine, Ser: 1.16 mg/dL (ref 0.40–1.20)
GFR: 50.91 mL/min — ABNORMAL LOW (ref >60.00)
Glucose, Bld: 94 mg/dL (ref 70–99)
Potassium: 4 meq/L (ref 3.5–5.1)
Sodium: 139 meq/L (ref 135–145)
Total Bilirubin: 0.5 mg/dL (ref 0.2–1.2)
Total Protein: 7.7 g/dL (ref 6.0–8.3)

## 2024-02-19 LAB — T4, FREE: Free T4: 1.06 ng/dL (ref 0.60–1.60)

## 2024-02-19 LAB — T3, FREE: T3, Free: 3.2 pg/mL (ref 2.3–4.2)

## 2024-02-19 LAB — C-REACTIVE PROTEIN: CRP: <1 mg/dL (ref 0.5–20.0)

## 2024-02-19 LAB — IBC PANEL
Iron: 136 ug/dL (ref 42–145)
Saturation Ratios: 36.7 % (ref 20.0–50.0)
TIBC: 371 ug/dL (ref 250.0–450.0)
Transferrin: 265 mg/dL (ref 212.0–360.0)

## 2024-02-19 LAB — VITAMIN B12: Vitamin B-12: 429 pg/mL (ref 211–911)

## 2024-02-19 LAB — TSH: TSH: 2.31 u[IU]/mL (ref 0.35–5.50)

## 2024-02-19 LAB — SEDIMENTATION RATE: Sed Rate: 15 mm/h (ref 0–30)

## 2024-02-19 LAB — URIC ACID: Uric Acid, Serum: 6.2 mg/dL (ref 2.4–7.0)

## 2024-02-19 LAB — FERRITIN: Ferritin: 44.4 ng/mL (ref 10.0–291.0)

## 2024-02-19 LAB — VITAMIN D 25 HYDROXY (VIT D DEFICIENCY, FRACTURES): VITD: 36.29 ng/mL (ref 30.00–100.00)

## 2024-02-19 MED ORDER — METHYLPREDNISOLONE ACETATE 80 MG/ML IJ SUSP
80.0000 mg | Freq: Once | INTRAMUSCULAR | Status: AC
  Administered 2024-02-19: 80 mg via INTRAMUSCULAR

## 2024-02-19 MED ORDER — TRAZODONE HCL 50 MG PO TABS: 50.0000 mg | ORAL_TABLET | Freq: Every evening | ORAL | 3 refills | Status: DC | PRN

## 2024-02-19 MED ORDER — KETOROLAC TROMETHAMINE 60 MG/2ML IM SOLN
60.0000 mg | Freq: Once | INTRAMUSCULAR | Status: AC
  Administered 2024-02-19: 60 mg via INTRAMUSCULAR

## 2024-02-19 NOTE — Assessment & Plan Note (Signed)
 Recheck las

## 2024-02-19 NOTE — Progress Notes (Signed)
 [name] [address] [place] [address] Phone: [phone] Subjective:   Alyssa [name] [name], am serving as a scribe for Dr. Arthea Claudene.  I'm seeing this patient by the request  of:  Pcp, No  CC: Multiple joint complaints  YEP:Dlagzrupcz  11/07/2023 Recent sprain with putting on her shoe.  A removable brace given.  Likely 2 weeks to fully heal.     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  Will get a bone scan to further evaluate for any type of loosening.     Chronic problem with exacerbation.  Does have some mild rotator cuff arthropathy.  Still responding extremely well though also to the home exercises and injections when needed.  No significant increase in weakness noted today.  Follow-up again in 6 to 8 weeks otherwise     B12 deficiency and B12 injection given today. 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  Was able to ambulate better.  Do believe that some of this is secondary to her back ago and did get another epidural.  Has responded well to that in the past and last one was nearly 3 years ago.  Will monitor for any weakness of the lower extremities.  Follow-up again in 6 weeks after the back injection     Update 02/19/2024 Alyssa [name] is a 63 y.o. female coming in with complaint of L hip and L knee pain. Hx of TKR left. Patient states that last week her hips and R shoulder stated bothering her again. Pain comes and goes but is painful most of the day. Pain worse at night.       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   Pre-diabetes    SVD (spontaneous vaginal delivery)    x 3    Past Surgical History:  Procedure Laterality Date   ABDOMINAL HYSTERECTOMY     APPENDECTOMY     with hysterectomy   ENDOMETRIAL ABLATION  2002   LAPAROSCOPIC CHOLECYSTECTOMY  12/09/2016   THYROID  LOBECTOMY N/A 01/22/2022   Procedure: THYROID  ISTHMUSECTOMY WITH RESECTION OF SUBSTERNAL MASS;  Surgeon: Eletha Boas, MD;  Location: [facility];  Service: General;  Laterality: N/A;   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   Procedure: LEFT TOTAL KNEE ARTHROPLASTY;  Surgeon: Sheril Coy, MD;  Location: [facility];  Service: Orthopedics;  Laterality: Left;   TUBAL LIGATION  1991   WISDOM TOOTH EXTRACTION     Social History   Socioeconomic History   Marital status: Married    Spouse name: Not on file   Number of children: 3   Years of education: Not on file   Highest education level: Associate degree: occupational, Scientist, product/process development, or vocational program  Occupational History   Not on file  Tobacco Use   Smoking status: Never   Smokeless tobacco: Never  Vaping Use   Vaping status: Never Used  Substance and Sexual Activity   Alcohol use: No   Drug use: No   Sexual activity: Yes    Birth control/protection: Post-menopausal    Comment: Hysterctomy  Other Topics Concern   Not on file  Social History Narrative  Not on file   Social Drivers of Health   Financial Resource Strain: Low Risk  (02/15/2023)   Overall Financial Resource Strain (CARDIA)    Difficulty of Paying Living Expenses: Not very hard  Food Insecurity: Low Risk  (11/07/2023)   Received from [facility]   Hunger Vital Sign    Within the past 12 months, you worried that your food would run out before you got money to buy more: Never true    Within the past 12 months, the food you bought just didn't last and you didn't have money to get more. : Never true  Transportation Needs: No Transportation Needs (11/07/2023)   Received from [facility]    In the past 12 months, has lack of reliable transportation kept  you from medical appointments, meetings, work or from getting things needed for daily living? : No  Physical Activity: Unknown (02/15/2023)   Exercise Vital Sign    Days of Exercise per Week: 0 days    Minutes of Exercise per Session: Not on file  Stress: No Stress Concern Present (02/15/2023)   [employer] of Occupational Health - Occupational Stress Questionnaire    Feeling of Stress : Only a little  Social Connections: Moderately Isolated (02/15/2023)   Social Connection and Isolation Panel    Frequency of Communication with Friends and Family: More than three times a week    Frequency of Social Gatherings with Friends and Family: Once a week    Attends Religious Services: Never    Database administrator or Organizations: No    Attends Engineer, structural: Not on file    Marital Status: Married   Allergies  Allergen Reactions   Codeine Itching   Doxycycline Other (See Comments)    chest pain   Penicillins Nausea And Vomiting   Prednisone  Anxiety    severe anxiety   Family History  Problem Relation Age of Onset   Cancer Mother 38       vulvar cancer   Lung cancer Mother 16   Stroke Father    Cancer - Lung Sister    Suicidality Sister    Suicidality Brother    Arthritis Brother    Prostate cancer Maternal Uncle    Prostate cancer Maternal Uncle    Stroke Maternal Grandmother    Prostate cancer Maternal Grandfather    Cervical cancer Paternal Grandmother    Throat cancer Paternal Grandfather    Colon cancer Neg Hx    Rectal cancer Neg Hx    Stomach cancer Neg Hx     Current Outpatient Medications (Endocrine & Metabolic):    levothyroxine  (SYNTHROID ) 112 MCG tablet, Take 1 tablet (112 mcg total) by mouth daily.  Current Outpatient Medications (Cardiovascular):    amLODipine  (NORVASC ) 5 MG tablet, TAKE 1 TABLET BY MOUTH DAILY   losartan  (COZAAR ) 50 MG tablet, TAKE 1 TABLET BY MOUTH DAILY  Current Outpatient Medications (Respiratory):     diphenhydrAMINE  (BENADRYL ) 25 MG tablet, Take 50 mg by mouth at bedtime as needed for sleep.  Current Outpatient Medications (Analgesics):    meloxicam  (MOBIC ) 15 MG tablet, TAKE 1 TABLET BY MOUTH ONCE DAILY *ANNUAL  APPOINTMENT  DUE  IN  [place]*   Current Outpatient Medications (Other):    DULoxetine  (CYMBALTA ) 30 MG capsule, TAKE 1 CAPSULE BY MOUTH DAILY   gabapentin  (NEURONTIN ) 100 MG capsule, Take 1 capsule (100 mg total) by mouth 2 (two) times daily as needed (nerve pain).   gabapentin  (  NEURONTIN ) 300 MG capsule, Take 1 capsule (300 mg total) by mouth at bedtime.   omeprazole  (PRILOSEC) 20 MG capsule, TAKE 1 CAPSULE BY MOUTH DAILY   tiZANidine  (ZANAFLEX ) 4 MG tablet, TAKE 1 TABLET BY MOUTH EVERY 6 HOURS AS NEEDED FOR MUSCLE SPASM   tiZANidine  (ZANAFLEX ) 4 MG tablet, TAKE 1 TABLET BY MOUTH AT BEDTIME   traZODone  (DESYREL ) 50 MG tablet, Take 1 tablet (50 mg total) by mouth at bedtime as needed for sleep.   Vitamin D , Ergocalciferol , (DRISDOL ) 1.25 MG (50000 UNIT) CAPS capsule, Take 1 capsule by mouth once a week   Reviewed prior external information including notes and imaging from  primary care provider As well as notes that were available from care everywhere and other healthcare systems.  Past medical history, social, surgical and family history all reviewed in electronic medical record.  No pertanent information unless stated regarding to the chief complaint.   Review of Systems:  No headache, visual changes, nausea, vomiting, diarrhea, constipation, dizziness, abdominal pain, skin rash, fevers, chills, night sweats, weight loss, swollen lymph nodes, body aches, joint swelling, chest pain, shortness of breath, mood changes. POSITIVE muscle aches  Objective  Blood pressure 124/86, pulse 69, height 5' 1 (1.549 m), weight 201 lb (91.2 kg), SpO2 95%.   General: No apparent distress alert and oriented x3 mood and affect normal, dressed appropriately.  HEENT: Pupils equal,  extraocular movements intact  Respiratory: Patient's speak in full sentences and does not appear short of breath  Cardiovascular: No lower extremity edema, non tender, no erythema  Continues discussed does have swelling noted over the first MTP joints, MCP joints, does have tightness noted of the shoulders bilaterally.  Neck also has some loss of lordosis.  Diffuse tenderness noted in the paraspinal musculature of the lumbar spine.  Difficulty even getting out of seated position.    Impression and Recommendations:    The above documentation has been reviewed and is accurate and complete Alyssa [name] M Alyssa Bhargava, DO

## 2024-02-19 NOTE — Assessment & Plan Note (Signed)
 Chronic problem with exacerbation.  Discussed icing regimen and home exercises, discussed which activities to do and which ones to avoid.  Increase activity slowly.  Discussed icing regimen, increase activity slowly.  Toradol  and Depo-Medrol  injections given today.  Discussed with patient that this could help with some inflammation.  Due to so many complaints I do feel that seeing how patient responds to this will be the most beneficial.  Patient is in agreement with the plan.  Follow-up with me again 6 weeks

## 2024-02-19 NOTE — Assessment & Plan Note (Signed)
 Recheck labs

## 2024-02-19 NOTE — Patient Instructions (Signed)
 Good to see you  Added trazadone 50 mg to take at night for a short time to help with sleep  Will get labs again today to see what is going on.  Note to stay out of work another day  Will write you in my chart  See me again in 6 weeks

## 2024-02-19 NOTE — Addendum Note (Signed)
 Addended by: GEROME CROME R on: 02/19/2024 01:00 PM   Modules accepted: Orders

## 2024-02-25 ENCOUNTER — Ambulatory Visit: Admitting: Family Medicine

## 2024-02-25 LAB — ANTI-NUCLEAR AB-TITER (ANA TITER): ANA Titer 1: 1:160 {titer} — ABNORMAL HIGH

## 2024-02-25 LAB — CYCLIC CITRUL PEPTIDE ANTIBODY, IGG: Cyclic Citrullin Peptide Ab: <16 U

## 2024-02-25 LAB — PTH, INTACT AND CALCIUM
Calcium: 9.9 mg/dL (ref 8.6–10.4)
PTH: 47 pg/mL (ref 16–77)

## 2024-02-25 LAB — RHEUMATOID FACTOR: Rheumatoid fact SerPl-aCnc: <10 [IU]/mL (ref <14)

## 2024-02-25 LAB — CALCIUM, IONIZED: Calcium, Ion: 5 mg/dL (ref 4.7–5.5)

## 2024-02-25 LAB — ANGIOTENSIN CONVERTING ENZYME: Angiotensin-Converting Enzyme: 31 U/L (ref 9–67)

## 2024-02-25 LAB — ANA: Anti Nuclear Antibody (ANA): POSITIVE — AB

## 2024-02-26 ENCOUNTER — Other Ambulatory Visit: Payer: Self-pay

## 2024-02-26 ENCOUNTER — Ambulatory Visit: Payer: Self-pay | Admitting: Family Medicine

## 2024-02-26 DIAGNOSIS — R768 Other specified abnormal immunological findings in serum: Secondary | ICD-10-CM

## 2024-02-28 ENCOUNTER — Other Ambulatory Visit: Payer: Self-pay | Admitting: Family Medicine

## 2024-03-12 ENCOUNTER — Ambulatory Visit: Admitting: Family Medicine

## 2024-03-24 ENCOUNTER — Encounter: Admitting: Rheumatology

## 2024-04-02 NOTE — Progress Notes (Deleted)
 [name] [address] [place] [address] Phone: [phone] Subjective:    I'm seeing this patient by the request  of:  Pcp, No  CC:   YEP:Dlagzrupcz  02/19/2024 Chronic problem with exacerbation.  Discussed icing regimen and home exercises, discussed which activities to do and which ones to avoid.  Increase activity slowly.  Discussed icing regimen, increase activity slowly.  Toradol  and Depo-Medrol  injections given today.  Discussed with patient that this could help with some inflammation.  Due to so many complaints I do feel that seeing how patient responds to this will be the most beneficial.  Patient is in agreement with the plan.  Follow-up with me again 6 weeks     Update 04/03/2024 Alyssa [name] is a 63 y.o. female coming in with complaint of L spine pain. 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   Pre-diabetes    SVD (spontaneous vaginal delivery)    x 3   Past Surgical History:  Procedure Laterality Date   ABDOMINAL HYSTERECTOMY     APPENDECTOMY     with hysterectomy   ENDOMETRIAL ABLATION  2002   LAPAROSCOPIC CHOLECYSTECTOMY  12/09/2016   THYROID  LOBECTOMY N/A 01/22/2022   Procedure: THYROID  ISTHMUSECTOMY WITH RESECTION OF SUBSTERNAL MASS;  Surgeon: Eletha Boas, MD;  Location: [facility];  Service: General;  Laterality: N/A;   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   Procedure: LEFT TOTAL KNEE ARTHROPLASTY;  Surgeon: Sheril Coy, MD;  Location: [facility];  Service: Orthopedics;  Laterality: Left;   TUBAL LIGATION  1991   WISDOM TOOTH EXTRACTION     Social History   Socioeconomic History   Marital status: Married    Spouse name: Not on file   Number of children: 3   Years of education: Not on file   Highest education level: Associate  degree: occupational, Scientist, product/process development, or vocational program  Occupational History   Not on file  Tobacco Use   Smoking status: Never   Smokeless tobacco: Never  Vaping Use   Vaping status: Never Used  Substance and Sexual Activity   Alcohol use: No   Drug use: No   Sexual activity: Yes    Birth control/protection: Post-menopausal    Comment: Hysterctomy  Other Topics Concern   Not on file  Social History Narrative   Not on file   Social Drivers of Health   Financial Resource Strain: Low Risk  (02/15/2023)   Overall Financial Resource Strain (CARDIA)    Difficulty of Paying Living Expenses: Not very hard  Food Insecurity: Low Risk  (11/07/2023)   Received from [facility]   Hunger Vital Sign    Within the past 12 months, you worried that your food would run out before you got money to buy more: Never true    Within the past 12 months, the food you bought just didn't last and you didn't have money to get more. : Never true  Transportation Needs: No Transportation Needs (11/07/2023)   Received from [facility]    In the past 12 months, has lack of reliable transportation kept you from medical appointments, meetings, work or from getting things needed for daily living? : No  Physical Activity: Unknown (  02/15/2023)   Exercise Vital Sign    Days of Exercise per Week: 0 days    Minutes of Exercise per Session: Not on file  Stress: No Stress Concern Present (02/15/2023)   [employer] of Occupational Health - Occupational Stress Questionnaire    Feeling of Stress : Only a little  Social Connections: Moderately Isolated (02/15/2023)   Social Connection and Isolation Panel    Frequency of Communication with Friends and Family: More than three times a week    Frequency of Social Gatherings with Friends and Family: Once a week    Attends Religious Services: Never    Database administrator or Organizations: No    Attends Engineer, structural: Not on file     Marital Status: Married   Allergies  Allergen Reactions   Codeine Itching   Doxycycline Other (See Comments)    chest pain   Penicillins Nausea And Vomiting   Prednisone  Anxiety    severe anxiety   Family History  Problem Relation Age of Onset   Cancer Mother 32       vulvar cancer   Lung cancer Mother 34   Stroke Father    Cancer - Lung Sister    Suicidality Sister    Suicidality Brother    Arthritis Brother    Prostate cancer Maternal Uncle    Prostate cancer Maternal Uncle    Stroke Maternal Grandmother    Prostate cancer Maternal Grandfather    Cervical cancer Paternal Grandmother    Throat cancer Paternal Grandfather    Colon cancer Neg Hx    Rectal cancer Neg Hx    Stomach cancer Neg Hx     Current Outpatient Medications (Endocrine & Metabolic):    levothyroxine  (SYNTHROID ) 112 MCG tablet, Take 1 tablet (112 mcg total) by mouth daily.  Current Outpatient Medications (Cardiovascular):    amLODipine  (NORVASC ) 5 MG tablet, TAKE 1 TABLET BY MOUTH DAILY   losartan  (COZAAR ) 50 MG tablet, TAKE 1 TABLET BY MOUTH DAILY  Current Outpatient Medications (Respiratory):    diphenhydrAMINE  (BENADRYL ) 25 MG tablet, Take 50 mg by mouth at bedtime as needed for sleep.  Current Outpatient Medications (Analgesics):    meloxicam  (MOBIC ) 15 MG tablet, TAKE 1 TABLET BY MOUTH ONCE DAILY *ANNUAL  APPOINTMENT  DUE  IN  [place]*   Current Outpatient Medications (Other):    DULoxetine  (CYMBALTA ) 30 MG capsule, TAKE 1 CAPSULE BY MOUTH DAILY   gabapentin  (NEURONTIN ) 100 MG capsule, Take 1 capsule (100 mg total) by mouth 2 (two) times daily as needed (nerve pain).   gabapentin  (NEURONTIN ) 300 MG capsule, Take 1 capsule (300 mg total) by mouth at bedtime.   omeprazole  (PRILOSEC) 20 MG capsule, TAKE 1 CAPSULE BY MOUTH DAILY   tiZANidine  (ZANAFLEX ) 4 MG tablet, TAKE 1 TABLET BY MOUTH EVERY 6 HOURS AS NEEDED FOR MUSCLE SPASM   tiZANidine  (ZANAFLEX ) 4 MG tablet, TAKE 1 TABLET BY MOUTH AT  BEDTIME   traZODone  (DESYREL ) 50 MG tablet, Take 1 tablet (50 mg total) by mouth at bedtime as needed for sleep.   Vitamin D , Ergocalciferol , (DRISDOL ) 1.25 MG (50000 UNIT) CAPS capsule, Take 1 capsule by mouth once a week   Reviewed prior external information including notes and imaging from  primary care provider As well as notes that were available from care everywhere and other healthcare systems.  Past medical history, social, surgical and family history all reviewed in electronic medical record.  No pertanent information unless stated regarding to the  chief complaint.   Review of Systems:  No headache, visual changes, nausea, vomiting, diarrhea, constipation, dizziness, abdominal pain, skin rash, fevers, chills, night sweats, weight loss, swollen lymph nodes, body aches, joint swelling, chest pain, shortness of breath, mood changes. POSITIVE muscle aches  Objective  There were no vitals taken for this visit.   General: No apparent distress alert and oriented x3 mood and affect normal, dressed appropriately.  HEENT: Pupils equal, extraocular movements intact  Respiratory: Patient's speak in full sentences and does not appear short of breath  Cardiovascular: No lower extremity edema, non tender, no erythema      Impression and Recommendations:

## 2024-04-03 ENCOUNTER — Ambulatory Visit: Admitting: Family Medicine

## 2024-04-03 ENCOUNTER — Other Ambulatory Visit: Payer: Self-pay | Admitting: Family Medicine

## 2024-04-06 NOTE — Progress Notes (Deleted)
 [name] [address] [place] [address] Phone: [phone] Subjective:    I'm seeing this patient by the request  of:  Pcp, No  CC: Shoulder and hip pain  YEP:Dlagzrupcz  02/19/2024 Chronic problem with exacerbation.  Discussed icing regimen and home exercises, discussed which activities to do and which ones to avoid.  Increase activity slowly.  Discussed icing regimen, increase activity slowly.  Toradol  and Depo-Medrol  injections given today.  Discussed with patient that this could help with some inflammation.  Due to so many complaints I do feel that seeing how patient responds to this will be the most beneficial.  Patient is in agreement with the plan.  Follow-up with me again 6 weeks     Update 04/07/2024 Alyssa [name] is a 63 y.o. female coming in with complaint of L spine pain.  Patient is having worsening pain in the back and given trazodone  to help her with some sleep as well. 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   Pre-diabetes    SVD (spontaneous vaginal delivery)    x 3   Past Surgical History:  Procedure Laterality Date   ABDOMINAL HYSTERECTOMY     APPENDECTOMY     with hysterectomy   ENDOMETRIAL ABLATION  2002   LAPAROSCOPIC CHOLECYSTECTOMY  12/09/2016   THYROID  LOBECTOMY N/A 01/22/2022   Procedure: THYROID  ISTHMUSECTOMY WITH RESECTION OF SUBSTERNAL MASS;  Surgeon: Eletha Boas, MD;  Location: [facility];  Service: General;  Laterality: N/A;   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   Procedure: LEFT TOTAL KNEE ARTHROPLASTY;  Surgeon: Sheril Coy, MD;  Location: [facility];  Service: Orthopedics;  Laterality: Left;   TUBAL LIGATION  1991   WISDOM TOOTH EXTRACTION     Social  History   Socioeconomic History   Marital status: Married    Spouse name: Not on file   Number of children: 3   Years of education: Not on file   Highest education level: Associate degree: occupational, Scientist, product/process development, or vocational program  Occupational History   Not on file  Tobacco Use   Smoking status: Never   Smokeless tobacco: Never  Vaping Use   Vaping status: Never Used  Substance and Sexual Activity   Alcohol use: No   Drug use: No   Sexual activity: Yes    Birth control/protection: Post-menopausal    Comment: Hysterctomy  Other Topics Concern   Not on file  Social History Narrative   Not on file   Social Drivers of Health   Financial Resource Strain: Low Risk  (02/15/2023)   Overall Financial Resource Strain (CARDIA)    Difficulty of Paying Living Expenses: Not very hard  Food Insecurity: Low Risk  (11/07/2023)   Received from [facility]   Hunger Vital Sign    Within the past 12 months, you worried that your food would run out before you got money to buy more: Never true    Within the past 12 months, the food you bought just didn't last and you didn't have money to get more. : Never true  Transportation Needs: No Transportation Needs (11/07/2023)   Received from [facility]  In the past 12 months, has lack of reliable transportation kept you from medical appointments, meetings, work or from getting things needed for daily living? : No  Physical Activity: Unknown (02/15/2023)   Exercise Vital Sign    Days of Exercise per Week: 0 days    Minutes of Exercise per Session: Not on file  Stress: No Stress Concern Present (02/15/2023)   [employer] of Occupational Health - Occupational Stress Questionnaire    Feeling of Stress : Only a little  Social Connections: Moderately Isolated (02/15/2023)   Social Connection and Isolation Panel    Frequency of Communication with Friends and Family: More than three times a week    Frequency of Social  Gatherings with Friends and Family: Once a week    Attends Religious Services: Never    Database administrator or Organizations: No    Attends Engineer, structural: Not on file    Marital Status: Married   Allergies  Allergen Reactions   Codeine Itching   Doxycycline Other (See Comments)    chest pain   Penicillins Nausea And Vomiting   Prednisone  Anxiety    severe anxiety   Family History  Problem Relation Age of Onset   Cancer Mother 6       vulvar cancer   Lung cancer Mother 82   Stroke Father    Cancer - Lung Sister    Suicidality Sister    Suicidality Brother    Arthritis Brother    Prostate cancer Maternal Uncle    Prostate cancer Maternal Uncle    Stroke Maternal Grandmother    Prostate cancer Maternal Grandfather    Cervical cancer Paternal Grandmother    Throat cancer Paternal Grandfather    Colon cancer Neg Hx    Rectal cancer Neg Hx    Stomach cancer Neg Hx     Current Outpatient Medications (Endocrine & Metabolic):    levothyroxine  (SYNTHROID ) 112 MCG tablet, Take 1 tablet (112 mcg total) by mouth daily.  Current Outpatient Medications (Cardiovascular):    amLODipine  (NORVASC ) 5 MG tablet, TAKE 1 TABLET BY MOUTH DAILY   losartan  (COZAAR ) 50 MG tablet, TAKE 1 TABLET BY MOUTH DAILY  Current Outpatient Medications (Respiratory):    diphenhydrAMINE  (BENADRYL ) 25 MG tablet, Take 50 mg by mouth at bedtime as needed for sleep.  Current Outpatient Medications (Analgesics):    meloxicam  (MOBIC ) 15 MG tablet, TAKE 1 TABLET BY MOUTH ONCE DAILY . APPOINTMENT REQUIRED FOR FUTURE REFILLS   Current Outpatient Medications (Other):    DULoxetine  (CYMBALTA ) 30 MG capsule, TAKE 1 CAPSULE BY MOUTH DAILY   gabapentin  (NEURONTIN ) 100 MG capsule, Take 1 capsule (100 mg total) by mouth 2 (two) times daily as needed (nerve pain).   gabapentin  (NEURONTIN ) 300 MG capsule, Take 1 capsule (300 mg total) by mouth at bedtime.   omeprazole  (PRILOSEC) 20 MG capsule, TAKE 1  CAPSULE BY MOUTH DAILY   tiZANidine  (ZANAFLEX ) 4 MG tablet, TAKE 1 TABLET BY MOUTH EVERY 6 HOURS AS NEEDED FOR MUSCLE SPASM   tiZANidine  (ZANAFLEX ) 4 MG tablet, TAKE 1 TABLET BY MOUTH AT BEDTIME   traZODone  (DESYREL ) 50 MG tablet, Take 1 tablet (50 mg total) by mouth at bedtime as needed for sleep.   Vitamin D , Ergocalciferol , (DRISDOL ) 1.25 MG (50000 UNIT) CAPS capsule, Take 1 capsule by mouth once a week   Reviewed prior external information including notes and imaging from  primary care provider As well as notes that were available from care everywhere  and other healthcare systems.  Past medical history, social, surgical and family history all reviewed in electronic medical record.  No pertanent information unless stated regarding to the chief complaint.   Review of Systems:  No headache, visual changes, nausea, vomiting, diarrhea, constipation, dizziness, abdominal pain, skin rash, fevers, chills, night sweats, weight loss, swollen lymph nodes, body aches, joint swelling, chest pain, shortness of breath, mood changes. POSITIVE muscle aches  Objective  There were no vitals taken for this visit.   General: No apparent distress alert and oriented x3 mood and affect normal, dressed appropriately.  HEENT: Pupils equal, extraocular movements intact  Respiratory: Patient's speak in full sentences and does not appear short of breath  Cardiovascular: No lower extremity edema, non tender, no erythema  Shoulder exam shows  Hip exam shows    Impression and Recommendations:    The above documentation has been reviewed and is accurate and complete Dayron Odland M Gresia Isidoro, DO

## 2024-04-07 ENCOUNTER — Ambulatory Visit: Admitting: Family Medicine

## 2024-04-13 ENCOUNTER — Encounter: Payer: Self-pay | Admitting: Family Medicine

## 2024-04-14 ENCOUNTER — Other Ambulatory Visit: Payer: Self-pay

## 2024-04-14 DIAGNOSIS — R7689 Other specified abnormal immunological findings in serum: Secondary | ICD-10-CM

## 2024-04-15 ENCOUNTER — Encounter

## 2024-04-17 ENCOUNTER — Other Ambulatory Visit: Payer: Self-pay | Admitting: Family Medicine

## 2024-04-21 NOTE — Progress Notes (Unsigned)
 [name] [address] [place] [address] Phone: [phone] Subjective:   Alyssa [name], am serving as a scribe for Dr. Arthea Claudene.  I'm seeing this patient by the request  of:  Pcp, No  CC: Right shoulder pain, low back pain  YEP:Alyssa [name]  02/19/2024 Chronic problem with exacerbation.  Discussed icing regimen and home exercises, discussed which activities to do and which ones to avoid.  Increase activity slowly.  Discussed icing regimen, increase activity slowly.  Toradol  and Depo-Medrol  injections given today.  Discussed with patient that this could help with some inflammation.  Due to so many complaints I do feel that seeing how patient responds to this will be the most beneficial.  Patient is in agreement with the plan.  Follow-up with me again 6 weeks   Recheck labs      Updated 04/23/2024 Alyssa [name] is a 63 y.o. female coming in with complaint of back pain. Patient states that her L knee is clicking today. Hx of TKR.   R shoulder is painful. Pain in bicep and in back of shoulder. Driving increases her pain. Able to flex shoulder without pain.   B SI joint pain. Pain radiating down L leg.       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   Pre-diabetes    SVD (spontaneous vaginal delivery)    x 3   Past Surgical History:  Procedure Laterality Date   ABDOMINAL HYSTERECTOMY     APPENDECTOMY     with hysterectomy   ENDOMETRIAL ABLATION  2002   LAPAROSCOPIC CHOLECYSTECTOMY  12/09/2016   THYROID  LOBECTOMY N/A 01/22/2022   Procedure: THYROID  ISTHMUSECTOMY WITH RESECTION OF SUBSTERNAL MASS;  Surgeon: Eletha Boas, MD;  Location: [facility];  Service: General;  Laterality: N/A;   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   Procedure: LEFT TOTAL KNEE ARTHROPLASTY;  Surgeon:  Sheril Coy, MD;  Location: [facility];  Service: Orthopedics;  Laterality: Left;   TUBAL LIGATION  1991   WISDOM TOOTH EXTRACTION     Social History   Socioeconomic History   Marital status: Married    Spouse name: Not on file   Number of children: 3   Years of education: Not on file   Highest education level: Associate degree: occupational, Scientist, product/process development, or vocational program  Occupational History   Not on file  Tobacco Use   Smoking status: Never   Smokeless tobacco: Never  Vaping Use   Vaping status: Never Used  Substance and Sexual Activity   Alcohol use: No   Drug use: No   Sexual activity: Yes    Birth control/protection: Post-menopausal    Comment: Hysterctomy  Other Topics Concern   Not on file  Social History Narrative   Not on file   Social Drivers of Health   Financial Resource Strain: Low Risk  (02/15/2023)   Overall Financial Resource Strain (CARDIA)    Difficulty of Paying Living Expenses: Not very hard  Food Insecurity: Low Risk  (11/07/2023)   Received from [facility]   Hunger Vital Sign    Within the past 12 months, you worried that your food would run out before you got money to buy more: Never true    Within the past 12 months, the food you  bought just didn't last and you didn't have money to get more. : Never true  Transportation Needs: No Transportation Needs (11/07/2023)   Received from [facility]   Transportation    In the past 12 months, has lack of reliable transportation kept you from medical appointments, meetings, work or from getting things needed for daily living? : No  Physical Activity: Unknown (02/15/2023)   Exercise Vital Sign    Days of Exercise per Week: 0 days    Minutes of Exercise per Session: Not on file  Stress: No Stress Concern Present (02/15/2023)   [employer] of Occupational Health - Occupational Stress Questionnaire    Feeling of Stress : Only a little  Social Connections: Moderately Isolated (02/15/2023)   Social  Connection and Isolation Panel    Frequency of Communication with Friends and Family: More than three times a week    Frequency of Social Gatherings with Friends and Family: Once a week    Attends Religious Services: Never    Database administrator or Organizations: No    Attends Engineer, structural: Not on file    Marital Status: Married   Allergies  Allergen Reactions   Codeine Itching   Doxycycline Other (See Comments)    chest pain   Penicillins Nausea And Vomiting   Prednisone  Anxiety    severe anxiety   Family History  Problem Relation Age of Onset   Cancer Mother 22       vulvar cancer   Lung cancer Mother 49   Stroke Father    Cancer - Lung Sister    Suicidality Sister    Suicidality Brother    Arthritis Brother    Prostate cancer Maternal Uncle    Prostate cancer Maternal Uncle    Stroke Maternal Grandmother    Prostate cancer Maternal Grandfather    Cervical cancer Paternal Grandmother    Throat cancer Paternal Grandfather    Colon cancer Neg Hx    Rectal cancer Neg Hx    Stomach cancer Neg Hx     Current Outpatient Medications (Endocrine & Metabolic):    levothyroxine  (SYNTHROID ) 112 MCG tablet, Take 1 tablet (112 mcg total) by mouth daily.  Current Outpatient Medications (Cardiovascular):    amLODipine  (NORVASC ) 5 MG tablet, TAKE 1 TABLET BY MOUTH DAILY   losartan  (COZAAR ) 50 MG tablet, TAKE 1 TABLET BY MOUTH DAILY  Current Outpatient Medications (Respiratory):    diphenhydrAMINE  (BENADRYL ) 25 MG tablet, Take 50 mg by mouth at bedtime as needed for sleep.  Current Outpatient Medications (Analgesics):    meloxicam  (MOBIC ) 15 MG tablet, TAKE 1 TABLET BY MOUTH ONCE DAILY . APPOINTMENT REQUIRED FOR FUTURE REFILLS   Current Outpatient Medications (Other):    DULoxetine  (CYMBALTA ) 30 MG capsule, TAKE 1 CAPSULE BY MOUTH DAILY   gabapentin  (NEURONTIN ) 100 MG capsule, Take 1 capsule (100 mg total) by mouth 2 (two) times daily as needed (nerve  pain).   gabapentin  (NEURONTIN ) 300 MG capsule, Take 1 capsule (300 mg total) by mouth at bedtime.   omeprazole  (PRILOSEC) 20 MG capsule, TAKE 1 CAPSULE BY MOUTH DAILY   tiZANidine  (ZANAFLEX ) 4 MG tablet, TAKE 1 TABLET BY MOUTH EVERY 6 HOURS AS NEEDED FOR MUSCLE SPASM   tiZANidine  (ZANAFLEX ) 4 MG tablet, TAKE 1 TABLET BY MOUTH AT BEDTIME   traZODone  (DESYREL ) 50 MG tablet, Take 1 tablet (50 mg total) by mouth at bedtime as needed for sleep.   Vitamin D , Ergocalciferol , (DRISDOL ) 1.25 MG (50000 UNIT) CAPS  capsule, Take 1 capsule by mouth once a week   Reviewed prior external information including notes and imaging from  primary care provider As well as notes that were available from care everywhere and other healthcare systems.  Past medical history, social, surgical and family history all reviewed in electronic medical record.  No pertanent information unless stated regarding to the chief complaint.   Review of Systems:  No headache, visual changes, nausea, vomiting, diarrhea, constipation, dizziness, abdominal pain, skin rash, fevers, chills, night sweats, weight loss, swollen lymph nodes, body aches, joint swelling, chest pain, shortness of breath, mood changes. POSITIVE muscle aches  Objective  Blood pressure 138/88, pulse 92, height 5' 1 (1.549 m), weight 200 lb (90.7 kg), SpO2 97%.   General: No apparent distress alert and oriented x3 mood and affect normal, dressed appropriately.  HEENT: Pupils equal, extraocular movements intact  Respiratory: Patient's speak in full sentences and does not appear short of breath  Cardiovascular: No lower extremity edema, non tender, no erythema  Right shoulder exam shows the patient does have positive impingement noted.  Positive pain with crossover though also noted.  Tender to palpation diffusely on the shoulder but more over the acromioclavicular joint.   After informed written and verbal consent, patient was seated on exam table. Right shoulder  was prepped with alcohol swab and utilizing posterior approach, patient's right glenohumeral space was injected with 4:1  marcaine  0.5%: depomedrol 80mg /dL. Patient tolerated the procedure well without immediate complications.  After informed verbal consent patient was seated on the exam table.  Right shoulder was prepped on the anterior and superior aspect of the shoulder with a 25-gauge half inch needle injected with 0.5 cc of 0.5% Marcaine  and 0.5 cc of depomedrol 80 mg/mL no blood loss.  Band-Aid placed.  Postinjection instruction given   Impression and Recommendations:    The above documentation has been reviewed and is accurate and complete Marja Adderley M Evianna Chandran, DO

## 2024-04-23 ENCOUNTER — Ambulatory Visit (INDEPENDENT_AMBULATORY_CARE_PROVIDER_SITE_OTHER): Admitting: Family Medicine

## 2024-04-23 ENCOUNTER — Encounter: Payer: Self-pay | Admitting: Family Medicine

## 2024-04-23 VITALS — BP 138/88 | HR 92 | Ht 61.0 in | Wt 200.0 lb

## 2024-04-23 DIAGNOSIS — M25411 Effusion, right shoulder: Secondary | ICD-10-CM

## 2024-04-23 DIAGNOSIS — M545 Low back pain, unspecified: Secondary | ICD-10-CM | POA: Diagnosis not present

## 2024-04-23 DIAGNOSIS — M7551 Bursitis of right shoulder: Secondary | ICD-10-CM

## 2024-04-23 NOTE — Assessment & Plan Note (Signed)
 Given injection today, first time we have done an injection in the shoulder previously.  Discussed icing regimen and home exercises, discussed which activities to do and which ones to avoid.  Increase activity slowly.  Follow-up again in 6 to 8 weeks.

## 2024-04-23 NOTE — Patient Instructions (Signed)
 Injection in shoulder today Good to see you! See you again in 3 months

## 2024-04-23 NOTE — Assessment & Plan Note (Signed)
 Repeat injection given today and tolerated the procedure well, discussed icing regimen and home exercises, discussed with patient that there has been some interstitial tearing noted previously and may need to further evaluate again.  Patient I would not want any surgical intervention.  Follow-up with me again in 2 to 3 months

## 2024-04-23 NOTE — Assessment & Plan Note (Signed)
 Discussed with patient back again having it intermittently.  If worsening I do feel advanced imaging is necessary with patient having the history of the ovarian cancer.  Increase activity slowly.  Continue to work on core strength and weight loss.  Follow-up again in 2 to 3 months.

## 2024-04-28 ENCOUNTER — Ambulatory Visit (INDEPENDENT_AMBULATORY_CARE_PROVIDER_SITE_OTHER)

## 2024-04-28 ENCOUNTER — Other Ambulatory Visit: Payer: Self-pay

## 2024-04-28 ENCOUNTER — Ambulatory Visit: Admitting: Family Medicine

## 2024-04-28 VITALS — BP 132/92 | HR 71 | Ht 61.0 in | Wt 199.0 lb

## 2024-04-28 DIAGNOSIS — M47812 Spondylosis without myelopathy or radiculopathy, cervical region: Secondary | ICD-10-CM | POA: Diagnosis not present

## 2024-04-28 DIAGNOSIS — M542 Cervicalgia: Secondary | ICD-10-CM | POA: Diagnosis not present

## 2024-04-28 DIAGNOSIS — M7551 Bursitis of right shoulder: Secondary | ICD-10-CM

## 2024-04-28 DIAGNOSIS — M19011 Primary osteoarthritis, right shoulder: Secondary | ICD-10-CM | POA: Diagnosis not present

## 2024-04-28 DIAGNOSIS — M25511 Pain in right shoulder: Secondary | ICD-10-CM | POA: Diagnosis not present

## 2024-04-28 MED ORDER — KETOROLAC TROMETHAMINE 30 MG/ML IJ SOLN
30.0000 mg | Freq: Once | INTRAMUSCULAR | Status: AC
  Administered 2024-04-28: 30 mg via INTRAMUSCULAR

## 2024-04-28 MED ORDER — METHYLPREDNISOLONE ACETATE 40 MG/ML IJ SUSP
40.0000 mg | Freq: Once | INTRAMUSCULAR | Status: AC
  Administered 2024-04-28: 40 mg via INTRAMUSCULAR

## 2024-04-28 NOTE — Assessment & Plan Note (Addendum)
 Still some swelling noted but with the radicular symptoms concern for more of a cervical radiculopathy.  On ultrasound questionable small hematoma from previous injection but do not think it is concerning at this time and will be reabsorbed in the near future.  Discussed icing regimen and home exercises, discussed which activities to do and which ones to avoid.  Increase activity slowly.  Patient knows to call us  if in the next 72 hours it does not seem to resolve.  Discussed different medications such as the gabapentin  and the trazodone  that patient does have and when to take appropriately.  Follow-up with me again as scheduled previously.  Toradol  and Depo-Medrol  injections given today.

## 2024-04-28 NOTE — Patient Instructions (Signed)
 Cocktail injection today Ice the back of shoulder 20 mins 4x a day Add gabapentin  during the day Try 2 trazodone  at night if needed Keep appointment in January, but update us  Friday

## 2024-04-28 NOTE — Progress Notes (Signed)
 [name] [address] [place] [address] Phone: [phone] Subjective:   Alyssa [name], am serving as a scribe for Dr. Arthea Claudene.  I'm seeing this patient by the request  of:  Pcp, No  CC: Right shoulder pain  YEP:Dlagzrupcz  Alyssa [name] is a 63 y.o. female coming in with complaint of right shoulder pain.  Was seen previously last week and given injections in the shoulder.  This was 5 days ago.  Patient states pain in shoulder posterior and anterior. Pain can be sharp. Injection last week only lasted 24 hours. Numbness in hands sometimes.      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   Pre-diabetes    SVD (spontaneous vaginal delivery)    x 3   Past Surgical History:  Procedure Laterality Date   ABDOMINAL HYSTERECTOMY     APPENDECTOMY     with hysterectomy   ENDOMETRIAL ABLATION  2002   LAPAROSCOPIC CHOLECYSTECTOMY  12/09/2016   THYROID  LOBECTOMY N/A 01/22/2022   Procedure: THYROID  ISTHMUSECTOMY WITH RESECTION OF SUBSTERNAL MASS;  Surgeon: Eletha Boas, MD;  Location: [facility];  Service: General;  Laterality: N/A;   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   Procedure: LEFT TOTAL KNEE ARTHROPLASTY;  Surgeon: Sheril Coy, MD;  Location: [facility];  Service: Orthopedics;  Laterality: Left;   TUBAL LIGATION  1991   WISDOM TOOTH EXTRACTION     Social History   Socioeconomic History   Marital status: Married    Spouse name: Not on file   Number of children: 3   Years of education: Not on file   Highest education level: Associate degree: occupational, scientist, product/process development, or vocational program  Occupational History   Not on file  Tobacco Use   Smoking status: Never   Smokeless tobacco: Never  Vaping Use   Vaping status: Never Used  Substance and Sexual Activity   Alcohol  use: No   Drug use: No   Sexual activity: Yes    Birth control/protection: Post-menopausal    Comment: Hysterctomy  Other Topics Concern   Not on file  Social History Narrative   Not on file   Social Drivers of Health   Financial Resource Strain: Low Risk  (02/15/2023)   Overall Financial Resource Strain (CARDIA)    Difficulty of Paying Living Expenses: Not very hard  Food Insecurity: Low Risk  (11/07/2023)   Received from [facility]   Hunger Vital Sign    Within the past 12 months, you worried that your food would run out before you got money to buy more: Never true    Within the past 12 months, the food you bought just didn't last and you didn't have money to get more. : Never true  Transportation Needs: No Transportation Needs (11/07/2023)   Received from [facility]    In the past 12 months, has lack of reliable transportation kept you from medical appointments, meetings, work or from getting things needed for daily living? : No  Physical Activity: Unknown (02/15/2023)   Exercise Vital Sign    Days of Exercise per Week: 0 days    Minutes of Exercise per Session: Not on file  Stress: No Stress Concern Present (02/15/2023)   [employer] of Occupational Health -  Occupational Stress Questionnaire    Feeling of Stress : Only a little  Social Connections: Moderately Isolated (02/15/2023)   Social Connection and Isolation Panel    Frequency of Communication with Friends and Family: More than three times a week    Frequency of Social Gatherings with Friends and Family: Once a week    Attends Religious Services: Never    Database Administrator or Organizations: No    Attends Engineer, Structural: Not on file    Marital Status: Married   Allergies  Allergen Reactions   Codeine Itching   Doxycycline Other (See Comments)    chest pain   Penicillins Nausea And Vomiting   Prednisone  Anxiety    severe anxiety   Family History  Problem Relation Age  of Onset   Cancer Mother 76       vulvar cancer   Lung cancer Mother 74   Stroke Father    Cancer - Lung Sister    Suicidality Sister    Suicidality Brother    Arthritis Brother    Prostate cancer Maternal Uncle    Prostate cancer Maternal Uncle    Stroke Maternal Grandmother    Prostate cancer Maternal Grandfather    Cervical cancer Paternal Grandmother    Throat cancer Paternal Grandfather    Colon cancer Neg Hx    Rectal cancer Neg Hx    Stomach cancer Neg Hx     Current Outpatient Medications (Endocrine & Metabolic):    levothyroxine  (SYNTHROID ) 112 MCG tablet, Take 1 tablet (112 mcg total) by mouth daily.  Current Outpatient Medications (Cardiovascular):    amLODipine  (NORVASC ) 5 MG tablet, TAKE 1 TABLET BY MOUTH DAILY   losartan  (COZAAR ) 50 MG tablet, TAKE 1 TABLET BY MOUTH DAILY  Current Outpatient Medications (Respiratory):    diphenhydrAMINE  (BENADRYL ) 25 MG tablet, Take 50 mg by mouth at bedtime as needed for sleep.  Current Outpatient Medications (Analgesics):    meloxicam  (MOBIC ) 15 MG tablet, TAKE 1 TABLET BY MOUTH ONCE DAILY . APPOINTMENT REQUIRED FOR FUTURE REFILLS   Current Outpatient Medications (Other):    DULoxetine  (CYMBALTA ) 30 MG capsule, TAKE 1 CAPSULE BY MOUTH DAILY   gabapentin  (NEURONTIN ) 100 MG capsule, Take 1 capsule (100 mg total) by mouth 2 (two) times daily as needed (nerve pain).   gabapentin  (NEURONTIN ) 300 MG capsule, Take 1 capsule (300 mg total) by mouth at bedtime.   omeprazole  (PRILOSEC) 20 MG capsule, TAKE 1 CAPSULE BY MOUTH DAILY   tiZANidine  (ZANAFLEX ) 4 MG tablet, TAKE 1 TABLET BY MOUTH EVERY 6 HOURS AS NEEDED FOR MUSCLE SPASM   tiZANidine  (ZANAFLEX ) 4 MG tablet, TAKE 1 TABLET BY MOUTH AT BEDTIME   traZODone  (DESYREL ) 50 MG tablet, Take 1 tablet (50 mg total) by mouth at bedtime as needed for sleep.   Vitamin D , Ergocalciferol , (DRISDOL ) 1.25 MG (50000 UNIT) CAPS capsule, Take 1 capsule by mouth once a week   Reviewed prior  external information including notes and imaging from  primary care provider As well as notes that were available from care everywhere and other healthcare systems.  Past medical history, social, surgical and family history all reviewed in electronic medical record.  No pertanent information unless stated regarding to the chief complaint.   Review of Systems:  No headache, visual changes, nausea, vomiting, diarrhea, constipation, dizziness, abdominal pain, skin rash, fevers, chills, night sweats, weight loss, swollen lymph nodes, body aches, joint swelling, chest pain, shortness of breath, mood changes. POSITIVE muscle aches  Objective  Blood pressure (!) 132/92, pulse 71, height 5' 1 (1.549 m), weight 199 lb (90.3 kg), SpO2 95%.   General: No apparent distress alert and oriented x3 mood and affect normal, dressed appropriately.  HEENT: Pupils equal, extraocular movements intact  Respiratory: Patient's speak in full sentences and does not appear short of breath  Cardiovascular: No lower extremity edema, non tender, no erythema  Shoulder exam shows actually improvement in range of motion from previous exam.  Patient has relatively good strength noted.  Worsening of pain with extension noted. Neck exam does have some tightness noted but no radicular symptoms with Spurling's.  5 out of 5 strength of the upper extremity noted otherwise.  Limited muscular skeletal ultrasound was performed and interpreted by CLAUDENE HUSSAR, M  Limited ultrasound patient shoulder shows a questionable posterior and small hematoma noted.  No increase in Doppler flow noted.  No communication with the joint space.  And seems to be in the muscle area.  Less than the size of a nickel.  Patient shoulder does have improvement though and the hypoechoic changes noted in the subacromial space as well as significant decrease in hypoechoic changes of the acromioclavicular joint.      Impression and Recommendations:     The  above documentation has been reviewed and is accurate and complete Marilee Ditommaso M Trini Soldo, DO

## 2024-04-30 ENCOUNTER — Other Ambulatory Visit: Payer: Self-pay | Admitting: Family Medicine

## 2024-04-30 ENCOUNTER — Ambulatory Visit: Payer: Self-pay | Admitting: Family Medicine

## 2024-05-01 ENCOUNTER — Ambulatory Visit: Admitting: Family Medicine

## 2024-05-05 ENCOUNTER — Encounter: Payer: Self-pay | Admitting: Family Medicine

## 2024-05-06 ENCOUNTER — Other Ambulatory Visit: Payer: Self-pay | Admitting: Family Medicine

## 2024-05-06 MED ORDER — PREDNISONE 20 MG PO TABS: 40.0000 mg | ORAL_TABLET | Freq: Every day | ORAL | 0 refills | Status: AC

## 2024-05-07 ENCOUNTER — Ambulatory Visit: Admitting: Family Medicine

## 2024-05-08 ENCOUNTER — Other Ambulatory Visit: Payer: Self-pay | Admitting: Family Medicine

## 2024-05-31 ENCOUNTER — Other Ambulatory Visit: Payer: Self-pay | Admitting: Family Medicine

## 2024-06-01 ENCOUNTER — Other Ambulatory Visit: Payer: Self-pay | Admitting: Family Medicine

## 2024-06-01 DIAGNOSIS — M2560 Stiffness of unspecified joint, not elsewhere classified: Secondary | ICD-10-CM | POA: Diagnosis not present

## 2024-06-01 DIAGNOSIS — M2540 Effusion, unspecified joint: Secondary | ICD-10-CM | POA: Diagnosis not present

## 2024-06-01 DIAGNOSIS — M79642 Pain in left hand: Secondary | ICD-10-CM | POA: Diagnosis not present

## 2024-06-01 DIAGNOSIS — M79641 Pain in right hand: Secondary | ICD-10-CM | POA: Diagnosis not present

## 2024-06-01 DIAGNOSIS — M79671 Pain in right foot: Secondary | ICD-10-CM | POA: Diagnosis not present

## 2024-06-01 DIAGNOSIS — R61 Generalized hyperhidrosis: Secondary | ICD-10-CM | POA: Diagnosis not present

## 2024-06-02 DIAGNOSIS — M79642 Pain in left hand: Secondary | ICD-10-CM | POA: Diagnosis not present

## 2024-06-02 DIAGNOSIS — M79641 Pain in right hand: Secondary | ICD-10-CM | POA: Diagnosis not present

## 2024-06-02 DIAGNOSIS — M79671 Pain in right foot: Secondary | ICD-10-CM | POA: Diagnosis not present

## 2024-06-02 DIAGNOSIS — R61 Generalized hyperhidrosis: Secondary | ICD-10-CM | POA: Diagnosis not present

## 2024-06-10 DIAGNOSIS — J029 Acute pharyngitis, unspecified: Secondary | ICD-10-CM | POA: Diagnosis not present

## 2024-06-10 DIAGNOSIS — L989 Disorder of the skin and subcutaneous tissue, unspecified: Secondary | ICD-10-CM | POA: Diagnosis not present

## 2024-06-10 DIAGNOSIS — I10 Essential (primary) hypertension: Secondary | ICD-10-CM | POA: Diagnosis not present

## 2024-06-10 DIAGNOSIS — R0989 Other specified symptoms and signs involving the circulatory and respiratory systems: Secondary | ICD-10-CM | POA: Diagnosis not present

## 2024-06-23 DIAGNOSIS — M1991 Primary osteoarthritis, unspecified site: Secondary | ICD-10-CM | POA: Diagnosis not present

## 2024-06-23 DIAGNOSIS — M79671 Pain in right foot: Secondary | ICD-10-CM | POA: Diagnosis not present

## 2024-06-27 ENCOUNTER — Encounter: Payer: Self-pay | Admitting: Family Medicine

## 2024-06-29 ENCOUNTER — Other Ambulatory Visit: Payer: Self-pay

## 2024-06-29 MED ORDER — GABAPENTIN 300 MG PO CAPS: 300.0000 mg | ORAL_CAPSULE | Freq: Every day | ORAL | 0 refills | Status: AC

## 2024-06-29 MED ORDER — TIZANIDINE HCL 4 MG PO TABS: 4.0000 mg | ORAL_TABLET | Freq: Every day | ORAL | 0 refills | Status: DC

## 2024-07-14 ENCOUNTER — Other Ambulatory Visit: Payer: Self-pay | Admitting: Family Medicine

## 2024-07-15 ENCOUNTER — Other Ambulatory Visit: Payer: Self-pay | Admitting: Family Medicine

## 2024-07-23 NOTE — Progress Notes (Unsigned)
 " [name] [address] [place] [address] Phone: [phone] Subjective:    I'm seeing this patient by the request  of:  Pcp, No  CC:   YEP:Dlagzrupcz  04/28/2024 Still some swelling noted but with the radicular symptoms concern for more of a cervical radiculopathy.  On ultrasound questionable small hematoma from previous injection but do not think it is concerning at this time and will be reabsorbed in the near future.  Discussed icing regimen and home exercises, discussed which activities to do and which ones to avoid.  Increase activity slowly.  Patient knows to call us  if in the next 72 hours it does not seem to resolve.  Discussed different medications such as the gabapentin  and the trazodone  that patient does have and when to take appropriately.  Follow-up with me again as scheduled previously.  Toradol  and Depo-Medrol  injections given today.      Update 07/24/2024 Alyssa [name] is a 64 y.o. female coming in with complaint of R shoulder pain. 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   Pre-diabetes    SVD (spontaneous vaginal delivery)    x 3   Past Surgical History:  Procedure Laterality Date   ABDOMINAL HYSTERECTOMY     APPENDECTOMY     with hysterectomy   ENDOMETRIAL ABLATION  2002   LAPAROSCOPIC CHOLECYSTECTOMY  12/09/2016   THYROID  LOBECTOMY N/A 01/22/2022   Procedure: THYROID  ISTHMUSECTOMY WITH RESECTION OF SUBSTERNAL MASS;  Surgeon: Eletha Boas, MD;  Location: [facility];  Service: General;  Laterality: N/A;   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   Procedure: LEFT TOTAL KNEE ARTHROPLASTY;  Surgeon: Sheril Coy, MD;  Location: [facility];  Service: Orthopedics;  Laterality: Left;   TUBAL LIGATION  1991   WISDOM TOOTH EXTRACTION     Social  History   Socioeconomic History   Marital status: Married    Spouse name: Not on file   Number of children: 3   Years of education: Not on file   Highest education level: Associate degree: occupational, scientist, product/process development, or vocational program  Occupational History   Not on file  Tobacco Use   Smoking status: Never   Smokeless tobacco: Never  Vaping Use   Vaping status: Never Used  Substance and Sexual Activity   Alcohol use: No   Drug use: No   Sexual activity: Yes    Birth control/protection: Post-menopausal    Comment: Hysterctomy  Other Topics Concern   Not on file  Social History Narrative   Not on file   Social Drivers of Health   Tobacco Use: Low Risk (06/10/2024)   Received from [facility]   Patient History    Smoking Tobacco Use: Never    Smokeless Tobacco Use: Never    Passive Exposure: Not on file  Financial Resource Strain: Low Risk (02/15/2023)   Overall Financial Resource Strain (CARDIA)    Difficulty of Paying Living Expenses: Not very hard  Food Insecurity: Low Risk (11/07/2023)   Received from [facility]   Epic    Within the past 12 months, you worried that your food would run out before you got money to buy more: Never true    Within the past 12 months, the food  you bought just didn't last and you didn't have money to get more. : Never true  Transportation Needs: No Transportation Needs (11/07/2023)   Received from [facility]   Transportation    In the past 12 months, has lack of reliable transportation kept you from medical appointments, meetings, work or from getting things needed for daily living? : No  Physical Activity: Unknown (02/15/2023)   Exercise Vital Sign    Days of Exercise per Week: 0 days    Minutes of Exercise per Session: Not on file  Stress: No Stress Concern Present (02/15/2023)   [employer] of Occupational Health - Occupational Stress Questionnaire    Feeling of Stress : Only a little  Social Connections: Moderately Isolated  (02/15/2023)   Social Connection and Isolation Panel    Frequency of Communication with Friends and Family: More than three times a week    Frequency of Social Gatherings with Friends and Family: Once a week    Attends Religious Services: Never    Database Administrator or Organizations: No    Attends Banker Meetings: Not on file    Marital Status: Married  Depression (PHQ2-9): Low Risk (03/06/2022)   Depression (PHQ2-9)    PHQ-2 Score: 0  Alcohol Screen: Not on file  Housing: Low Risk (11/07/2023)   Received from [facility]   Epic    What is your living situation today?: I have a steady place to live    Think about the place you live. Do you have problems with any of the following? Choose all that apply:: None/None on this list  Utilities: Low Risk (11/07/2023)   Received from [facility]   Utilities    In the past 12 months has the electric, gas, oil, or water  company threatened to shut off services in your home? : No  Health Literacy: Not on file   Allergies[1] Family History  Problem Relation Age of Onset   Cancer Mother 60       vulvar cancer   Lung cancer Mother 55   Stroke Father    Cancer - Lung Sister    Suicidality Sister    Suicidality Brother    Arthritis Brother    Prostate cancer Maternal Uncle    Prostate cancer Maternal Uncle    Stroke Maternal Grandmother    Prostate cancer Maternal Grandfather    Cervical cancer Paternal Grandmother    Throat cancer Paternal Grandfather    Colon cancer Neg Hx    Rectal cancer Neg Hx    Stomach cancer Neg Hx     Current Outpatient Medications (Endocrine & Metabolic):    levothyroxine  (SYNTHROID ) 112 MCG tablet, Take 1 tablet (112 mcg total) by mouth daily.   predniSONE  (DELTASONE ) 20 MG tablet, Take 2 tablets (40 mg total) by mouth daily with breakfast.  Current Outpatient Medications (Cardiovascular):    amLODipine  (NORVASC ) 5 MG tablet, TAKE 1 TABLET BY MOUTH DAILY   losartan  (COZAAR ) 50 MG tablet,  TAKE 1 TABLET BY MOUTH DAILY  Current Outpatient Medications (Respiratory):    diphenhydrAMINE  (BENADRYL ) 25 MG tablet, Take 50 mg by mouth at bedtime as needed for sleep.  Current Outpatient Medications (Analgesics):    meloxicam  (MOBIC ) 15 MG tablet, TAKE 1 TABLET BY MOUTH ONCE DAILY . APPOINTMENT REQUIRED FOR FUTURE REFILLS  Current Outpatient Medications (Other):    DULoxetine  (CYMBALTA ) 30 MG capsule, TAKE 1 CAPSULE BY MOUTH DAILY   gabapentin  (NEURONTIN ) 100 MG capsule, TAKE 1 CAPSULE BY MOUTH  TWICE DAILY AS NEEDED FOR NERVE PAIN.  TAKE 300MG  CAPSULE AT BEDTIME.   gabapentin  (NEURONTIN ) 300 MG capsule, Take 1 capsule (300 mg total) by mouth at bedtime.   omeprazole  (PRILOSEC) 20 MG capsule, TAKE 1 CAPSULE BY MOUTH DAILY   tiZANidine  (ZANAFLEX ) 4 MG tablet, TAKE 1 TABLET BY MOUTH EVERY 6 HOURS AS NEEDED FOR MUSCLE SPASM   tiZANidine  (ZANAFLEX ) 4 MG tablet, Take 1 tablet (4 mg total) by mouth at bedtime.   traZODone  (DESYREL ) 50 MG tablet, TAKE 1 TABLET BY MOUTH AT BEDTIME AS NEEDED FOR SLEEP   Vitamin D , Ergocalciferol , (DRISDOL ) 1.25 MG (50000 UNIT) CAPS capsule, Take 1 capsule by mouth once a week   Reviewed prior external information including notes and imaging from  primary care provider As well as notes that were available from care everywhere and other healthcare systems.  Past medical history, social, surgical and family history all reviewed in electronic medical record.  No pertanent information unless stated regarding to the chief complaint.   Review of Systems:  No headache, visual changes, nausea, vomiting, diarrhea, constipation, dizziness, abdominal pain, skin rash, fevers, chills, night sweats, weight loss, swollen lymph nodes, body aches, joint swelling, chest pain, shortness of breath, mood changes. POSITIVE muscle aches  Objective  There were no vitals taken for this visit.   General: No apparent distress alert and oriented x3 mood and affect normal, dressed  appropriately.  HEENT: Pupils equal, extraocular movements intact  Respiratory: Patient's speak in full sentences and does not appear short of breath  Cardiovascular: No lower extremity edema, non tender, no erythema      Impression and Recommendations:           [1]  Allergies Allergen Reactions   Codeine Itching   Doxycycline Other (See Comments)    chest pain   Penicillins Nausea And Vomiting   Prednisone  Anxiety    severe anxiety   "

## 2024-07-24 ENCOUNTER — Ambulatory Visit: Admitting: Family Medicine

## 2024-07-24 ENCOUNTER — Encounter: Payer: Self-pay | Admitting: Family Medicine

## 2024-07-24 ENCOUNTER — Other Ambulatory Visit: Payer: Self-pay

## 2024-07-24 MED ORDER — PREDNISONE 20 MG PO TABS: 20.0000 mg | ORAL_TABLET | Freq: Every day | ORAL | 0 refills | Status: AC

## 2024-07-24 MED ORDER — GABAPENTIN 100 MG PO CAPS: 100.0000 mg | ORAL_CAPSULE | Freq: Every day | ORAL | 0 refills | Status: AC

## 2024-07-24 MED ORDER — TIZANIDINE HCL 4 MG PO TABS: 4.0000 mg | ORAL_TABLET | Freq: Every day | ORAL | 0 refills | Status: AC

## 2024-07-24 NOTE — Progress Notes (Unsigned)
 " [name] [address] [place] [address] Phone: [phone] Subjective:    I'm seeing this patient by the request  of:  Pcp, No  CC: Right shoulder pain follow-up  YEP:Dlagzrupcz  04/28/2024 Still some swelling noted but with the radicular symptoms concern for more of a cervical radiculopathy.  On ultrasound questionable small hematoma from previous injection but do not think it is concerning at this time and will be reabsorbed in the near future.  Discussed icing regimen and home exercises, discussed which activities to do and which ones to avoid.  Increase activity slowly.  Patient knows to call us  if in the next 72 hours it does not seem to resolve.  Discussed different medications such as the gabapentin  and the trazodone  that patient does have and when to take appropriately.  Follow-up with me again as scheduled previously.  Toradol  and Depo-Medrol  injections given today.      Update 07/28/2024 Toryn Dewalt is a 64 y.o. female coming in with complaint of R shoulder pain. Patient states     Continues to take gabapentin .  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   Pre-diabetes    SVD (spontaneous vaginal delivery)    x 3   Past Surgical History:  Procedure Laterality Date   ABDOMINAL HYSTERECTOMY     APPENDECTOMY     with hysterectomy   ENDOMETRIAL ABLATION  2002   LAPAROSCOPIC CHOLECYSTECTOMY  12/09/2016   THYROID  LOBECTOMY N/A 01/22/2022   Procedure: THYROID  ISTHMUSECTOMY WITH RESECTION OF SUBSTERNAL MASS;  Surgeon: Eletha Boas, MD;  Location: [facility];  Service: General;  Laterality: N/A;   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   Procedure: LEFT TOTAL KNEE ARTHROPLASTY;  Surgeon: Sheril Coy, MD;  Location: [facility];  Service: Orthopedics;  Laterality: Left;   TUBAL  LIGATION  1991   WISDOM TOOTH EXTRACTION     Social History   Socioeconomic History   Marital status: Married    Spouse name: Not on file   Number of children: 3   Years of education: Not on file   Highest education level: Associate degree: occupational, scientist, product/process development, or vocational program  Occupational History   Not on file  Tobacco Use   Smoking status: Never   Smokeless tobacco: Never  Vaping Use   Vaping status: Never Used  Substance and Sexual Activity   Alcohol use: No   Drug use: No   Sexual activity: Yes    Birth control/protection: Post-menopausal    Comment: Hysterctomy  Other Topics Concern   Not on file  Social History Narrative   Not on file   Social Drivers of Health   Tobacco Use: Low Risk (06/10/2024)   Received from [facility]   Patient History    Smoking Tobacco Use: Never    Smokeless Tobacco Use: Never    Passive Exposure: Not on file  Financial Resource Strain: Low Risk (02/15/2023)   Overall Financial Resource Strain (CARDIA)    Difficulty of Paying Living Expenses: Not very hard  Food Insecurity: Low Risk (11/07/2023)   Received from [facility]   Epic    Within the past 12 months, you worried that your food would run out before you got money to buy more: Never true    Within  the past 12 months, the food you bought just didn't last and you didn't have money to get more. : Never true  Transportation Needs: No Transportation Needs (11/07/2023)   Received from [facility]    In the past 12 months, has lack of reliable transportation kept you from medical appointments, meetings, work or from getting things needed for daily living? : No  Physical Activity: Unknown (02/15/2023)   Exercise Vital Sign    Days of Exercise per Week: 0 days    Minutes of Exercise per Session: Not on file  Stress: No Stress Concern Present (02/15/2023)   [employer] of Occupational Health - Occupational Stress Questionnaire    Feeling of Stress :  Only a little  Social Connections: Moderately Isolated (02/15/2023)   Social Connection and Isolation Panel    Frequency of Communication with Friends and Family: More than three times a week    Frequency of Social Gatherings with Friends and Family: Once a week    Attends Religious Services: Never    Database Administrator or Organizations: No    Attends Banker Meetings: Not on file    Marital Status: Married  Depression (PHQ2-9): Low Risk (03/06/2022)   Depression (PHQ2-9)    PHQ-2 Score: 0  Alcohol Screen: Not on file  Housing: Low Risk (11/07/2023)   Received from [facility]   Epic    What is your living situation today?: I have a steady place to live    Think about the place you live. Do you have problems with any of the following? Choose all that apply:: None/None on this list  Utilities: Low Risk (11/07/2023)   Received from [facility]   Utilities    In the past 12 months has the electric, gas, oil, or water  company threatened to shut off services in your home? : No  Health Literacy: Not on file   Allergies[1] Family History  Problem Relation Age of Onset   Cancer Mother 27       vulvar cancer   Lung cancer Mother 40   Stroke Father    Cancer - Lung Sister    Suicidality Sister    Suicidality Brother    Arthritis Brother    Prostate cancer Maternal Uncle    Prostate cancer Maternal Uncle    Stroke Maternal Grandmother    Prostate cancer Maternal Grandfather    Cervical cancer Paternal Grandmother    Throat cancer Paternal Grandfather    Colon cancer Neg Hx    Rectal cancer Neg Hx    Stomach cancer Neg Hx     Current Outpatient Medications (Endocrine & Metabolic):    levothyroxine  (SYNTHROID ) 112 MCG tablet, Take 1 tablet (112 mcg total) by mouth daily.   predniSONE  (DELTASONE ) 20 MG tablet, Take 2 tablets (40 mg total) by mouth daily with breakfast.   predniSONE  (DELTASONE ) 20 MG tablet, Take 1 tablet (20 mg total) by mouth daily with  breakfast.  Current Outpatient Medications (Cardiovascular):    amLODipine  (NORVASC ) 5 MG tablet, TAKE 1 TABLET BY MOUTH DAILY   losartan  (COZAAR ) 50 MG tablet, TAKE 1 TABLET BY MOUTH DAILY  Current Outpatient Medications (Respiratory):    diphenhydrAMINE  (BENADRYL ) 25 MG tablet, Take 50 mg by mouth at bedtime as needed for sleep.  Current Outpatient Medications (Analgesics):    meloxicam  (MOBIC ) 15 MG tablet, TAKE 1 TABLET BY MOUTH ONCE DAILY . APPOINTMENT REQUIRED FOR FUTURE REFILLS  Current Outpatient Medications (Other):  DULoxetine  (CYMBALTA ) 30 MG capsule, TAKE 1 CAPSULE BY MOUTH DAILY   gabapentin  (NEURONTIN ) 100 MG capsule, Take 1 capsule (100 mg total) by mouth at bedtime.   gabapentin  (NEURONTIN ) 300 MG capsule, Take 1 capsule (300 mg total) by mouth at bedtime.   omeprazole  (PRILOSEC) 20 MG capsule, TAKE 1 CAPSULE BY MOUTH DAILY   tiZANidine  (ZANAFLEX ) 4 MG tablet, TAKE 1 TABLET BY MOUTH EVERY 6 HOURS AS NEEDED FOR MUSCLE SPASM   tiZANidine  (ZANAFLEX ) 4 MG tablet, Take 1 tablet (4 mg total) by mouth at bedtime.   traZODone  (DESYREL ) 50 MG tablet, TAKE 1 TABLET BY MOUTH AT BEDTIME AS NEEDED FOR SLEEP   Vitamin D , Ergocalciferol , (DRISDOL ) 1.25 MG (50000 UNIT) CAPS capsule, Take 1 capsule by mouth once a week   Reviewed prior external information including notes and imaging from  primary care provider As well as notes that were available from care everywhere and other healthcare systems.  Past medical history, social, surgical and family history all reviewed in electronic medical record.  No pertanent information unless stated regarding to the chief complaint.   Review of Systems:  No headache, visual changes, nausea, vomiting, diarrhea, constipation, dizziness, abdominal pain, skin rash, fevers, chills, night sweats, weight loss, swollen lymph nodes, body aches, joint swelling, chest pain, shortness of breath, mood changes. POSITIVE muscle aches  Objective  There were no  vitals taken for this visit.   General: No apparent distress alert and oriented x3 mood and affect normal, dressed appropriately.  HEENT: Pupils equal, extraocular movements intact  Respiratory: Patient's speak in full sentences and does not appear short of breath  Cardiovascular: No lower extremity edema, non tender, no erythema  Right shoulder exam shows  Limited muscular skeletal ultrasound was performed and interpreted by CLAUDENE HUSSAR, M      Impression and Recommendations:    The above documentation has been reviewed and is accurate and complete Hussar CHRISTELLA Claudene, DO        [1]  Allergies Allergen Reactions   Codeine Itching   Doxycycline Other (See Comments)    chest pain   Penicillins Nausea And Vomiting   Prednisone  Anxiety    severe anxiety   "

## 2024-07-28 ENCOUNTER — Ambulatory Visit: Admitting: Family Medicine

## 2024-08-17 ENCOUNTER — Ambulatory Visit: Admitting: Family Medicine
